# Patient Record
Sex: Female | Born: 1960
Health system: Southern US, Community
[De-identification: ages and names within clinical notes are randomized; demographics above are authoritative.]

## PROBLEM LIST (undated history)

## (undated) DIAGNOSIS — Z9221 Personal history of antineoplastic chemotherapy: Secondary | ICD-10-CM

## (undated) DIAGNOSIS — M1990 Unspecified osteoarthritis, unspecified site: Secondary | ICD-10-CM

## (undated) DIAGNOSIS — E669 Obesity, unspecified: Secondary | ICD-10-CM

## (undated) DIAGNOSIS — Z8719 Personal history of other diseases of the digestive system: Secondary | ICD-10-CM

## (undated) DIAGNOSIS — Z789 Other specified health status: Secondary | ICD-10-CM

## (undated) DIAGNOSIS — K219 Gastro-esophageal reflux disease without esophagitis: Secondary | ICD-10-CM

## (undated) DIAGNOSIS — F32A Depression, unspecified: Secondary | ICD-10-CM

## (undated) DIAGNOSIS — Z923 Personal history of irradiation: Secondary | ICD-10-CM

## (undated) DIAGNOSIS — F329 Major depressive disorder, single episode, unspecified: Secondary | ICD-10-CM

## (undated) DIAGNOSIS — M25561 Pain in right knee: Secondary | ICD-10-CM

## (undated) DIAGNOSIS — C50919 Malignant neoplasm of unspecified site of unspecified female breast: Secondary | ICD-10-CM

## (undated) DIAGNOSIS — IMO0002 Reserved for concepts with insufficient information to code with codable children: Secondary | ICD-10-CM

## (undated) DIAGNOSIS — C801 Malignant (primary) neoplasm, unspecified: Secondary | ICD-10-CM

## (undated) DIAGNOSIS — F419 Anxiety disorder, unspecified: Secondary | ICD-10-CM

## (undated) DIAGNOSIS — Z79810 Long term (current) use of selective estrogen receptor modulators (SERMs): Secondary | ICD-10-CM

## (undated) DIAGNOSIS — E785 Hyperlipidemia, unspecified: Secondary | ICD-10-CM

## (undated) HISTORY — DX: Malignant neoplasm of unspecified site of unspecified female breast: C50.919

## (undated) HISTORY — DX: Malignant (primary) neoplasm, unspecified: C80.1

## (undated) HISTORY — DX: Major depressive disorder, single episode, unspecified: F32.9

## (undated) HISTORY — DX: Depression, unspecified: F32.A

## (undated) HISTORY — DX: Unspecified osteoarthritis, unspecified site: M19.90

## (undated) HISTORY — DX: Obesity, unspecified: E66.9

## (undated) HISTORY — DX: Reserved for concepts with insufficient information to code with codable children: IMO0002

## (undated) HISTORY — DX: Pain in right knee: M25.561

## (undated) HISTORY — PX: COLONOSCOPY: SHX174

## (undated) HISTORY — DX: Anxiety disorder, unspecified: F41.9

---

## 1999-01-02 ENCOUNTER — Encounter: Admission: RE | Admit: 1999-01-02 | Discharge: 1999-01-02 | Payer: Self-pay | Admitting: *Deleted

## 1999-03-11 ENCOUNTER — Other Ambulatory Visit: Admission: RE | Admit: 1999-03-11 | Discharge: 1999-03-11 | Payer: Self-pay | Admitting: Obstetrics and Gynecology

## 2000-03-23 ENCOUNTER — Other Ambulatory Visit: Admission: RE | Admit: 2000-03-23 | Discharge: 2000-03-23 | Payer: Self-pay | Admitting: Obstetrics and Gynecology

## 2000-04-29 HISTORY — PX: ABDOMINAL HYSTERECTOMY: SHX81

## 2000-06-07 ENCOUNTER — Encounter (INDEPENDENT_AMBULATORY_CARE_PROVIDER_SITE_OTHER): Payer: Self-pay

## 2000-06-07 ENCOUNTER — Inpatient Hospital Stay (HOSPITAL_COMMUNITY): Admission: RE | Admit: 2000-06-07 | Discharge: 2000-06-10 | Payer: Self-pay | Admitting: Obstetrics and Gynecology

## 2001-04-20 ENCOUNTER — Encounter: Payer: Self-pay | Admitting: Obstetrics and Gynecology

## 2001-04-20 ENCOUNTER — Ambulatory Visit (HOSPITAL_COMMUNITY): Admission: RE | Admit: 2001-04-20 | Discharge: 2001-04-20 | Payer: Self-pay | Admitting: Obstetrics and Gynecology

## 2003-07-23 ENCOUNTER — Other Ambulatory Visit: Admission: RE | Admit: 2003-07-23 | Discharge: 2003-07-23 | Payer: Self-pay | Admitting: Obstetrics and Gynecology

## 2004-04-20 ENCOUNTER — Emergency Department (HOSPITAL_COMMUNITY): Admission: EM | Admit: 2004-04-20 | Discharge: 2004-04-20 | Payer: Self-pay | Admitting: Emergency Medicine

## 2004-05-10 ENCOUNTER — Emergency Department (HOSPITAL_COMMUNITY): Admission: EM | Admit: 2004-05-10 | Discharge: 2004-05-10 | Payer: Self-pay | Admitting: *Deleted

## 2005-03-22 ENCOUNTER — Ambulatory Visit (HOSPITAL_COMMUNITY): Admission: RE | Admit: 2005-03-22 | Discharge: 2005-03-22 | Payer: Self-pay | Admitting: *Deleted

## 2008-11-29 DIAGNOSIS — M1990 Unspecified osteoarthritis, unspecified site: Secondary | ICD-10-CM

## 2008-11-29 DIAGNOSIS — IMO0002 Reserved for concepts with insufficient information to code with codable children: Secondary | ICD-10-CM

## 2008-11-29 HISTORY — DX: Unspecified osteoarthritis, unspecified site: M19.90

## 2008-11-29 HISTORY — DX: Reserved for concepts with insufficient information to code with codable children: IMO0002

## 2009-05-09 ENCOUNTER — Emergency Department (HOSPITAL_COMMUNITY): Admission: EM | Admit: 2009-05-09 | Discharge: 2009-05-09 | Payer: Self-pay | Admitting: Emergency Medicine

## 2010-05-05 ENCOUNTER — Encounter: Admission: RE | Admit: 2010-05-05 | Discharge: 2010-05-05 | Payer: Self-pay | Admitting: Sports Medicine

## 2010-05-07 ENCOUNTER — Encounter: Admission: RE | Admit: 2010-05-07 | Discharge: 2010-05-07 | Payer: Self-pay | Admitting: Sports Medicine

## 2010-05-11 ENCOUNTER — Ambulatory Visit (HOSPITAL_COMMUNITY): Admission: RE | Admit: 2010-05-11 | Discharge: 2010-05-11 | Payer: Self-pay | Admitting: Obstetrics and Gynecology

## 2010-05-13 ENCOUNTER — Encounter: Admission: RE | Admit: 2010-05-13 | Discharge: 2010-05-13 | Payer: Self-pay | Admitting: Sports Medicine

## 2010-05-20 ENCOUNTER — Encounter: Admission: RE | Admit: 2010-05-20 | Discharge: 2010-05-20 | Payer: Self-pay | Admitting: Obstetrics and Gynecology

## 2010-05-29 ENCOUNTER — Encounter: Admission: RE | Admit: 2010-05-29 | Discharge: 2010-05-29 | Payer: Self-pay | Admitting: Sports Medicine

## 2010-06-24 ENCOUNTER — Encounter: Admission: RE | Admit: 2010-06-24 | Discharge: 2010-06-24 | Payer: Self-pay | Admitting: Sports Medicine

## 2010-06-26 ENCOUNTER — Emergency Department (HOSPITAL_COMMUNITY): Admission: EM | Admit: 2010-06-26 | Discharge: 2010-06-26 | Payer: Self-pay | Admitting: Emergency Medicine

## 2010-12-20 ENCOUNTER — Encounter: Payer: Self-pay | Admitting: Sports Medicine

## 2011-01-07 ENCOUNTER — Other Ambulatory Visit: Payer: Self-pay | Admitting: Sports Medicine

## 2011-01-07 DIAGNOSIS — M25552 Pain in left hip: Secondary | ICD-10-CM

## 2011-01-10 ENCOUNTER — Other Ambulatory Visit: Payer: Self-pay

## 2011-01-11 ENCOUNTER — Encounter (INDEPENDENT_AMBULATORY_CARE_PROVIDER_SITE_OTHER): Payer: Self-pay | Admitting: *Deleted

## 2011-01-12 ENCOUNTER — Ambulatory Visit
Admission: RE | Admit: 2011-01-12 | Discharge: 2011-01-12 | Disposition: A | Discharge status: Home / Self Care | Payer: Private Health Insurance - Indemnity | Source: Ambulatory Visit | Attending: Sports Medicine | Admitting: Sports Medicine

## 2011-01-12 DIAGNOSIS — M25552 Pain in left hip: Secondary | ICD-10-CM

## 2011-01-20 NOTE — Letter (Signed)
Summary: Pre Visit Letter Revised  [facility]  [address] [place], [place] [address]   Phone: [phone]  Fax: [phone]        01/11/2011 MRN: [id number] [facility] [address] [place], [place]  [address]             Procedure Date:  February 04, 2011   dir col -Dr Juanda Chance   Welcome to the Gastroenterology Division at [facility].    You are scheduled to see a nurse for your pre-procedure visit on January 21, 2011 at 9:00am on the 3rd floor at [facility], [address]  We ask that you try to arrive at our office 15 minutes prior to your appointment time to allow for check-in.  Please take a minute to review the attached form.  If you answer "Yes" to one or more of the questions on the first page, we ask that you call the person listed at your earliest opportunity.  If you answer "No" to all of the questions, please complete the rest of the form and bring it to your appointment.    Your nurse visit will consist of discussing your medical and surgical history, your immediate family medical history, and your medications.   If you are unable to list all of your medications on the form, please bring the medication bottles to your appointment and we will list them.  We will need to be aware of both prescribed and over the counter drugs.  We will need to know exact dosage information as well.    Please be prepared to read and sign documents such as consent forms, a financial agreement, and acknowledgement forms.  If necessary, and with your consent, a friend or relative is welcome to sit-in on the nurse visit with you.  Please bring your insurance card so that we may make a copy of it.  If your insurance requires a referral to see a specialist, please bring your referral form from your primary care physician.  No co-pay is required for this nurse visit.     If you cannot keep your appointment, please call [phone] to cancel or reschedule prior  to your appointment date.  This allows [place] the opportunity to schedule an appointment for another patient in need of care.    Thank you for choosing  Gastroenterology for your medical needs.  We appreciate the opportunity to care for you.  Please visit [place] at our website  to learn more about our practice.  Sincerely, The Gastroenterology Division

## 2011-01-21 ENCOUNTER — Other Ambulatory Visit: Payer: Self-pay | Admitting: Sports Medicine

## 2011-01-21 DIAGNOSIS — M47817 Spondylosis without myelopathy or radiculopathy, lumbosacral region: Secondary | ICD-10-CM

## 2011-01-26 ENCOUNTER — Ambulatory Visit
Admission: RE | Admit: 2011-01-26 | Discharge: 2011-01-26 | Disposition: A | Discharge status: Home / Self Care | Payer: Private Health Insurance - Indemnity | Source: Ambulatory Visit | Attending: Sports Medicine | Admitting: Sports Medicine

## 2011-01-26 DIAGNOSIS — M47817 Spondylosis without myelopathy or radiculopathy, lumbosacral region: Secondary | ICD-10-CM

## 2011-02-04 ENCOUNTER — Other Ambulatory Visit: Payer: Self-pay | Admitting: Internal Medicine

## 2011-02-15 ENCOUNTER — Other Ambulatory Visit: Payer: Self-pay | Admitting: Sports Medicine

## 2011-02-15 DIAGNOSIS — M47817 Spondylosis without myelopathy or radiculopathy, lumbosacral region: Secondary | ICD-10-CM

## 2011-02-18 ENCOUNTER — Ambulatory Visit
Admission: RE | Admit: 2011-02-18 | Discharge: 2011-02-18 | Disposition: A | Discharge status: Home / Self Care | Payer: Private Health Insurance - Indemnity | Source: Ambulatory Visit | Attending: Sports Medicine | Admitting: Sports Medicine

## 2011-02-18 DIAGNOSIS — M47817 Spondylosis without myelopathy or radiculopathy, lumbosacral region: Secondary | ICD-10-CM

## 2011-03-08 LAB — URINALYSIS, ROUTINE W REFLEX MICROSCOPIC
Ketones, ur: NEGATIVE mg/dL
Nitrite: NEGATIVE
Protein, ur: NEGATIVE mg/dL
Specific Gravity, Urine: 1.004 — ABNORMAL LOW (ref 1.005–1.030)

## 2011-03-08 LAB — DIFFERENTIAL
Basophils Absolute: 0 10*3/uL (ref 0.0–0.1)
Basophils Relative: 1 % (ref 0–1)
Eosinophils Absolute: 0.1 10*3/uL (ref 0.0–0.7)
Eosinophils Relative: 1 % (ref 0–5)
Lymphocytes Relative: 32 % (ref 12–46)
Lymphs Abs: 2.4 10*3/uL (ref 0.7–4.0)
Monocytes Relative: 8 % (ref 3–12)
Neutrophils Relative %: 59 % (ref 43–77)

## 2011-03-08 LAB — POCT I-STAT, CHEM 8
BUN: 7 mg/dL (ref 6–23)
Calcium, Ion: 1.11 mmol/L — ABNORMAL LOW (ref 1.12–1.32)
Chloride: 104 mEq/L (ref 96–112)
Glucose, Bld: 104 mg/dL — ABNORMAL HIGH (ref 70–99)
HCT: 36 % (ref 36.0–46.0)
Potassium: 4 mEq/L (ref 3.5–5.1)

## 2011-03-08 LAB — POCT PREGNANCY, URINE: Preg Test, Ur: NEGATIVE

## 2011-03-08 LAB — CBC
RDW: 12.4 % (ref 11.5–15.5)
WBC: 7.4 10*3/uL (ref 4.0–10.5)

## 2011-04-13 ENCOUNTER — Other Ambulatory Visit: Payer: Self-pay | Admitting: Sports Medicine

## 2011-04-13 DIAGNOSIS — M47817 Spondylosis without myelopathy or radiculopathy, lumbosacral region: Secondary | ICD-10-CM

## 2011-04-16 ENCOUNTER — Ambulatory Visit
Admission: RE | Admit: 2011-04-16 | Discharge: 2011-04-16 | Disposition: A | Discharge status: Home / Self Care | Payer: Private Health Insurance - Indemnity | Source: Ambulatory Visit | Attending: Sports Medicine | Admitting: Sports Medicine

## 2011-04-16 DIAGNOSIS — M47817 Spondylosis without myelopathy or radiculopathy, lumbosacral region: Secondary | ICD-10-CM

## 2011-04-16 NOTE — Discharge Summary (Signed)
[facility]  Patient:    Joann Page, Joann Page                        MRN: [id number] Adm. Date:  [id number] Disc. Date: [id number] Attending:  Miguel Aschoff                           Discharge Summary  ADMITTING DIAGNOSES:          Symptomatic uterine fibroids, umbilical hernia.  OPERATIONS AND PROCEDURES:    1. Total abdominal hysterectomy.                               2. Umbilical herniorrhaphy.  BRIEF HISTORY:                The patient is a 50 year old black female, para 1-0-0-1, with a history of chronic pelvic pain and irregular vaginal bleeding. The patient underwent diagnostic laparoscopy at which time she was noted to have multiple subserosal and intramural leiomyomas.  In addition, she had history of irregular vaginal bleeding which was treated with Provera.  The patient was started on Depot Lupron therapy and received three injections of this medication.  It did cause some improvement of her pain but not satisfactory in view of the patient.  It did, however, correct the irregular bleeding.  Because of her persistent pelvic pain, she wanted definitive procedure to be carried out and was offered total abdominal hysterectomy.  In addition, she reported that she had a bulge near the umbilicus with symptomatic umbilical hernia and asked that this be repaired at the time of her surgical procedure.  HOSPITAL COURSE:              Preoperative studies were obtained.  The patient had an admission hemoglobin of 14.6, white count 4500.  Platelet count was normal.  PT and PTT were within normal limits.  Comprehensive metabolic panel was within normal limits.  Urinalysis was negative except for trace of leukocyte esterase.  On June 07, 2000, under general anesthesia, a total abdominal hysterectomy was carried out without difficulty, as well as a repair of her umbilical hernia.  The patient tolerated this procedure well.  Her hemoglobin remained stable.  She did  tolerate increasing ambulation and diet well and was well enough to be discharged home on June 10, 2000.  DISCHARGE MEDICATIONS:        Percocet 5 mg 1 every 3 hours as needed for pain.  FOLLOW-UP:                    We set up a follow-up visit for four weeks.  DISCHARGE INSTRUCTIONS:       She was instructed no heavy lifting, to call if there were any problems such as severe pain or heavy bleeding.  She was instructed to place nothing in the vagina and not to drive for approximately ten days.  CONDITION ON DISCHARGE:       Satisfactory.  Her diet was regular.  Final pathology report revealed focal adenomyosis, uterine serosal fibrous adhesions, two benign leiomyomata, proliferative endometrium, and chronic cervicitis. DD:  08/06/00 TD:  08/07/00 Job: [id number]

## 2011-04-16 NOTE — Op Note (Signed)
[facility]  Patient:    Joann Page, Joann Page                        MRN: [id number] Proc. Date: 06/07/00 Attending:  Miguel Aschoff, M.D.                           Operative Report  PREOPERATIVE DIAGNOSES:       1. Uterine fibroids, menorrhagia, pelvic pain.                               2. Umbilical hernia.  POSTOPERATIVE DIAGNOSES:  OPERATIONS AND PROCEDURES:    Total abdominal hysterectomy.  Umbilical hernia repair.  SURGEON:                      Miguel Aschoff, M.D.  ASSISTANT:                    Luvenia Redden, M.D.  ANESTHESIA:                   General.  COMPLICATIONS:                None.  JUSTIFICATION:                The patient is a 50 year old black female with a history of uterine fibroids associated with pelvic pain and heavy bleeding. The patient has undergone Lupron therapy in an effort to decrease the size and symptoms associated with the fibroids.  The last course of treatment was completed in January.  She presents now to undergo definitive therapy via total abdominal hysterectomy.  In addition, she is noted to have symptomatic umbilical hernia in the superior portion of the umbilicus and is to have this repaired at this time.  DESCRIPTION OF PROCEDURE:     Patient was taken to the operating room, placed in a supine position and general anesthesia was administered without difficulty.  She was then prepped and draped in the usual sterile fashion. Foley catheter was inserted.  A Pfannenstiel incision was then made and extended down through the subcutaneous tissue, with bleeding points being clamped and coagulated as they were encountered.  The fascia was then identified and incised transversely.  The fascia was then separated from the underlying rectus muscles.  Rectus muscles were divided in the midline.  The peritoneum was identified and entered, carefully avoiding underlying structures.  The peritoneal incision was extended under  direct visualization. After this was done, a self-retaining retractor was placed through the wound and the viscera were packed out of the pelvis.  The uterus showed small residual uterine fibroids following the Lupron therapy.  Tubes and ovaries appeared to be within normal limits except for prior evidence of a tubal sterilization.  Cul-de-sac was unremarkable.  Intestinal surfaces were unremarkable.  At this point, the uterus was elevated by placing Kelly clamps across the uteroovarian ligaments.  The round ligaments were then identified, clamped, cut and suture-ligated using suture ligatures of 0 Vicryl.  Bladder flap was then created anteriorly and the broad ligament was skeletonized. Perforations were made below the uteroovarian ligaments.  These ligaments were then clamped with Heaney clamps and then these pedicles were cut and doubly ligated using ligatures of 0 Vicryl.  Uterine vessels were then identified, clamped with curved Heaney clamps.  These pedicles were  cut and suture-ligated using suture ligatures of 0 Vicryl.  Then in serial fashion, after a bladder flap had been created, the paracervical fascia, cardinal ligaments and uterosacral ligaments were clamped with straight Heaney clamps and all pedicles were cut and suture-ligated using suture ligatures of 0 Vicryl and curved Heaney clamps were placed across the reflection of the vagina on the cervix.  This then allowed the removal of the specimen which consisted of the cervix and uterus.  The angles of the vaginal cuff were then ligated using figure-of-eight sutures of 0 Vicryl.  The uterosacral ligaments were incorporated into these angle sutures for support.  These were then held. Then the cuff was closed using interrupted 0 Vicryl suture ligatures. Inspection was made for hemostasis, hemostasis appeared to be excellent and then the pelvic floor was reperitonealized using running continuous 2-0 Vicryl suture.  All pedicles  were then inspected.  There was excellent hemostasis noted.  At this point, I elected to close the abdomen.  Prior to closing the abdomen, the defect in the peritoneum at the level of the umbilicus was found, as were the defects in the fascia.  The fascia in this area was grasped with Kocher clamps and using two figure-of-eight sutures of 0 Vicryl, it was possible to close the fascial defect at the level of the umbilicus.  Once this was done, the abdomen was closed.  The parietal peritoneum was closed using running continuous 0 Vicryl suture.  The rectus muscles were reapproximated using running continuous 0 Vicryl suture.  Fascia was then closed using two sutures of 0 Vicryl, each starting at the lateral fascial angles and meeting in the midline.  Subcutaneous tissue was closed using interrupted 0 Vicryl sutures and the skin incision was closed using staples.  The estimated blood loss was approximately 200 cc.  The patient tolerated the procedure well and went to the recovery room in satisfactory condition. DD:  06/07/00 TD:  06/07/00 Job: [id number]

## 2011-04-16 NOTE — H&P (Signed)
[facility]  Patient:    Joann Page, Joann Page                        MRN: [id number] Adm. Date:  06/07/00 Attending:  Miguel Aschoff, M.D.                         History and Physical  ADMISSION DIAGNOSIS:          Symptomatic uterine fibroids, umbilical hernia.  HISTORY:                      The patient is a 50 year old black female para 1-0-0-1 who has a history of pelvic pain and bleeding.  The patient was noted to have large uterine fibroids and ultimately underwent laparoscopy in 1998, at which time she was noted to have multiple subserosal and submucosal leiomyomas.  In addition, her irregular bleeding at that time was treated with cyclic Provera therapy.  The patient initially did well; however, developed pelvic pain again and was started on Depo-Lupron in an effort to try to reduce the size of the uterine fibroids.  Patient was started on this medication in November 2000 and received three injections of the Depo-Lupron.  This did cause some improvement of her symptoms; however, did not really improve the patients pain.  In view of this symptomatology, she desired a definitive procedure to be carried out due to the irregular bleeding and pain associated with the fibroids.  She was therefore scheduled at this time to undergo abdominal hysterectomy.                                In addition, the patient has a history of an umbilical hernia, mildly symptomatic, and has requested that this be repaired if possible at the time of hysterectomy.  Patients last Pap smear on March 23, 2000 was satisfactory and within normal limits.  The patient denies any history of dysuria, frequency, or urgency.  She has no history of dysuria. There is no history of nausea, vomiting, diarrhea, melena, or hematochezia.  PAST MEDICAL HISTORY:         Negative for blood pressure, heart disease, or asthma.  PAST SURGICAL HISTORY:        Positive for laparoscopy, tubal  sterilization, and myomectomy.  MEDICATIONS AT THE CURRENT TIME:                 Depo-Testosterone.  ALLERGIC HISTORY:             Positive for ASPIRIN, which causes swelling.  REVIEW OF SYSTEMS:            Review of HEENT is negative.  CARDIORESPIRATORY review is negative for shortness of breath, cough, or hemoptysis.  GI review is as per the present illness.  GU review - see present illness.  GYN review - see present illness.  PHYSICAL EXAMINATION:  GENERAL:                      The patient is a well-developed, well-nourished black female in no acute distress.  VITAL SIGNS:                  Temperature 97.7, pulse 70, respirations 20, blood pressure 118/73.  HEENT:  Head normocephalic, atraumatic.  Eyes show pupils to be equal, round, and reactive to light and accommodation.  Sclerae are white and conjunctivae are pink.  Extraocular muscles show full range of motion.  Throat is pink without gross lesions.  Tongue is moist and midline.  NECK:                         Supple.  There is no adenopathy or jugular venous distension noted.  No thyromegaly is noted.  HEART:                        Regular rate and rhythm with no murmur or gallop.  BREASTS:                      Nontender.  No masses are found.  ABDOMEN:                      Soft without any evidence of enlargement of the liver, kidneys, or spleen.  There is an approximately 2 x 2 cm umbilical hernia present in the superior left portion of the umbilicus.  Remainder of the abdominal exam is negative.  BACK:                         Examination reveals no CVA tenderness or deformity.  PELVIC:                       Normal external genitalia, normal Bartholin and Skenes glands, normal urethra.  Vaginal vault is without gross lesions. Cervix is without gross lesions.  Uterus is approximately six weeks size and slightly irregular in shape, moderately tender.  The adnexa reveal no definite masses.   Rectovaginal exam is confirmatory.  EXTREMITIES:                  No cyanosis, clubbing, or edema.  SKIN:                         Without gross lesion.  NEUROLOGIC:                   Grossly intact.  IMPRESSION:                   1. Symptomatic uterine fibroids with history of                                  irregular bleeding and pelvic pain.                               2. Umbilical hernia.  PLAN:                         The patient to undergo total abdominal hysterectomy and repair of her umbilical hernia. DD:  06/07/00 TD:  06/07/00 Job: [id number]

## 2011-04-16 NOTE — Op Note (Signed)
NAMEDORIANNE, PERRET                 ACCOUNT NO.:  [id number]   MEDICAL RECORD NO.:  [id number]          PATIENT TYPE:  AMB   LOCATION:  ENDO                         FACILITY:  [facility]   PHYSICIAN:  Georgiana Spinner, M.D.    DATE OF BIRTH:  01/04/1965   DATE OF PROCEDURE:  03/22/2005  DATE OF DISCHARGE:                                 OPERATIVE REPORT   PROCEDURE:  Colonoscopy.   INDICATIONS FOR PROCEDURE:  Rectal bleeding, family history of colon cancer.   ANESTHESIA:  Demerol 90, Versed 10 mg.   DESCRIPTION OF PROCEDURE:  With the patient mildly sedated in the left  lateral decubitus position, the Olympus videoscopic colonoscope was inserted  in the rectum and passed under direct vision to the cecum identified by the  base of cecum and ileocecal valve both of which were photographed. From this  point, the colonoscope was slowly withdrawn taking circumferential views of  the colonic mucosa stopping only in the rectum which appeared normal on  direct and showed hemorrhoids on retroflexed view. The endoscope was  straightened and withdrawn. The patient's vital signs and pulse oximeter  remained stable. The patient tolerated the procedure well without apparent  complications.   FINDINGS:  Internal hemorrhoids otherwise unremarkable colonoscopic  examination.   PLAN:  Repeat examination probably in about five years.      GMO/MEDQ  D:  03/22/2005  T:  03/22/2005  Job:  [id number]

## 2011-04-29 ENCOUNTER — Ambulatory Visit (AMBULATORY_SURGERY_CENTER): Payer: Private Health Insurance - Indemnity | Admitting: *Deleted

## 2011-04-29 ENCOUNTER — Telehealth: Payer: Self-pay | Admitting: *Deleted

## 2011-04-29 ENCOUNTER — Encounter: Payer: Self-pay | Admitting: Internal Medicine

## 2011-04-29 VITALS — Ht 66.0 in | Wt 205.2 lb

## 2011-04-29 DIAGNOSIS — Z1211 Encounter for screening for malignant neoplasm of colon: Secondary | ICD-10-CM

## 2011-04-29 DIAGNOSIS — Z800 Family history of malignant neoplasm of digestive organs: Secondary | ICD-10-CM

## 2011-04-29 MED ORDER — PEG-KCL-NACL-NASULF-NA ASC-C 100 G PO SOLR: ORAL | Status: DC

## 2011-04-29 NOTE — Telephone Encounter (Signed)
Dr Juanda Chance- pt is scheduled for colonoscopy w/ you 6/11. You have not seen her before.  She had colonoscopy w/ Dr. Virginia Rochester 2006. pt had hemorrhoids, otherwise normal colon. Pt is not having any problems.  Father had colon CA in late 39's. Is pt due for colon now? Ezra Sites

## 2011-05-02 ENCOUNTER — Emergency Department (HOSPITAL_COMMUNITY): Payer: Private Health Insurance - Indemnity

## 2011-05-02 ENCOUNTER — Emergency Department (HOSPITAL_COMMUNITY)
Admission: EM | Admit: 2011-05-02 | Discharge: 2011-05-03 | Disposition: A | Discharge status: Home / Self Care | Payer: Private Health Insurance - Indemnity | Attending: Emergency Medicine | Admitting: Emergency Medicine

## 2011-05-02 DIAGNOSIS — R109 Unspecified abdominal pain: Secondary | ICD-10-CM | POA: Insufficient documentation

## 2011-05-02 DIAGNOSIS — R110 Nausea: Secondary | ICD-10-CM | POA: Insufficient documentation

## 2011-05-02 LAB — URINALYSIS, ROUTINE W REFLEX MICROSCOPIC
Nitrite: NEGATIVE
Specific Gravity, Urine: 1.021 (ref 1.005–1.030)
Urobilinogen, UA: 1 mg/dL (ref 0.0–1.0)
pH: 5.5 (ref 5.0–8.0)

## 2011-05-02 LAB — DIFFERENTIAL
Lymphocytes Relative: 26 % (ref 12–46)
Lymphs Abs: 2.1 10*3/uL (ref 0.7–4.0)
Neutrophils Relative %: 66 % (ref 43–77)

## 2011-05-02 LAB — CBC
HCT: 37.9 % (ref 36.0–46.0)
MCH: 29.1 pg (ref 26.0–34.0)
MCV: 88.1 fL (ref 78.0–100.0)
WBC: 8.3 10*3/uL (ref 4.0–10.5)

## 2011-05-02 LAB — COMPREHENSIVE METABOLIC PANEL
ALT: 38 U/L — ABNORMAL HIGH (ref 0–35)
CO2: 24 mEq/L (ref 19–32)
Calcium: 8.9 mg/dL (ref 8.4–10.5)
Creatinine, Ser: 0.72 mg/dL (ref 0.4–1.2)
GFR calc Af Amer: >60 mL/min (ref >60)
GFR calc non Af Amer: >60 mL/min (ref >60)
Glucose, Bld: 85 mg/dL (ref 70–99)
Sodium: 137 mEq/L (ref 135–145)
Total Protein: 7.2 g/dL (ref 6.0–8.3)

## 2011-05-02 LAB — LIPASE, BLOOD: Lipase: 23 U/L (ref 11–59)

## 2011-05-03 ENCOUNTER — Telehealth: Payer: Self-pay | Admitting: Internal Medicine

## 2011-05-03 ENCOUNTER — Telehealth: Payer: Self-pay | Admitting: *Deleted

## 2011-05-03 NOTE — Telephone Encounter (Signed)
Pt aware of need for colon now. Joann Page

## 2011-05-03 NOTE — Telephone Encounter (Signed)
Yes, she definitely needs a colonoscopy every 5 years if had dad had colon cancer in his 26'ies.

## 2011-05-03 NOTE — Telephone Encounter (Signed)
Pt. Seen in ER at [facility] last night with abd. Discomfort, bloating, etc.  States they put her on a medication for her stomach that's helping a lot.  She wants to see the doctor for her symptoms.  Advised her to call schedulers and see if she could be worked in.  Also advised her that she could stay on the ?PPI they gave her for her stomach at the ER.  She was CT scanned while in the ER

## 2011-05-03 NOTE — Telephone Encounter (Signed)
Patient was in ER last night for abdominal pain. She has never been seen here but is scheduled for direct colonoscopy with Dr. Juanda Chance on 05/10/11. She states her stomach is bothering her and she thinks she needs to be seen prior to procedure. Scheduled with Willette Cluster, NP on 05/05/11 at 8:30 AM

## 2011-05-05 ENCOUNTER — Ambulatory Visit: Payer: Private Health Insurance - Indemnity | Admitting: Nurse Practitioner

## 2011-05-07 ENCOUNTER — Telehealth: Payer: Self-pay | Admitting: Internal Medicine

## 2011-05-07 ENCOUNTER — Telehealth: Payer: Self-pay | Admitting: *Deleted

## 2011-05-07 NOTE — Telephone Encounter (Signed)
See phone note from 05-03-11.  Pt states she still has feeling of "fullness, nausea, no appetite." Pt is on Pantoprazole and states the pain she was having is better.  Pt wanted to make sure Dr. Juanda Chance wanted to proceed with colonoscopy scheduled on Monday or if she wanted her to have an office visit first.

## 2011-05-07 NOTE — Telephone Encounter (Signed)
See earlier phone note from 05-07-11.  Dr. Juanda Chance is out of the office, so Dr. Christella Hartigan spoken with about pt.  Per Dr. Christella Hartigan, pt should see Dr. Juanda Chance is office before colonoscopy should be done.  Pt advised and understanding voiced.  Pt transferred to [facility] on third floor to cancel colonoscopy and set up office visit.

## 2011-05-07 NOTE — Telephone Encounter (Signed)
I have spoken to the pt, she is still nauseated, may not be able to drink the prep. The colon has been canceled. I will see her in the office next week.

## 2011-05-10 ENCOUNTER — Other Ambulatory Visit: Payer: Private Health Insurance - Indemnity | Admitting: Internal Medicine

## 2011-05-13 ENCOUNTER — Encounter: Payer: Self-pay | Admitting: Internal Medicine

## 2011-05-13 ENCOUNTER — Encounter: Payer: Self-pay | Admitting: Physician Assistant

## 2011-05-13 ENCOUNTER — Ambulatory Visit (INDEPENDENT_AMBULATORY_CARE_PROVIDER_SITE_OTHER): Payer: Private Health Insurance - Indemnity | Admitting: Physician Assistant

## 2011-05-13 VITALS — BP 108/80 | HR 68 | Ht 66.0 in | Wt 205.4 lb

## 2011-05-13 DIAGNOSIS — R1013 Epigastric pain: Secondary | ICD-10-CM

## 2011-05-13 DIAGNOSIS — R110 Nausea: Secondary | ICD-10-CM

## 2011-05-13 DIAGNOSIS — Z800 Family history of malignant neoplasm of digestive organs: Secondary | ICD-10-CM

## 2011-05-13 DIAGNOSIS — R6881 Early satiety: Secondary | ICD-10-CM

## 2011-05-13 MED ORDER — PANTOPRAZOLE SODIUM 40 MG PO TBEC: 40.0000 mg | DELAYED_RELEASE_TABLET | Freq: Every day | ORAL | Status: DC

## 2011-05-13 MED ORDER — PEG-KCL-NACL-NASULF-NA ASC-C 100 G PO SOLR: 1.0000 | Freq: Once | ORAL | Status: AC

## 2011-05-13 MED ORDER — PEG-KCL-NACL-NASULF-NA ASC-C 100 G PO SOLR: 1.0000 | Freq: Once | ORAL | Status: DC

## 2011-05-13 MED ORDER — ONDANSETRON HCL 4 MG PO TABS: 4.0000 mg | ORAL_TABLET | Freq: Every day | ORAL | Status: AC | PRN

## 2011-05-13 MED ORDER — ONDANSETRON HCL 4 MG PO TABS: 4.0000 mg | ORAL_TABLET | Freq: Every day | ORAL | Status: DC | PRN

## 2011-05-13 NOTE — Patient Instructions (Signed)
We have given you samples of the Align probiotic. Take 1 capsule for 14 days. Stay off the Naprosyn. We sent prescriptions to [facility] [address] for Moviprep for the colonoscopy, Pantoprazole Sodium, and Zofran 4mg .  We scheduled the colonoscopy /Endoscopy for 7-24. Call us 1 week ahead of the 7-24 and if you are feeling much better, we can cancel the Endoscopy and we will only do the colonoscopy.

## 2011-05-13 NOTE — Progress Notes (Signed)
Subjective:    Patient ID: Joann Page, female    DOB: Aug 31, 1961, 50 y.o.   MRN: [id number]  HPI Joann Page is a pleasant 50 year old female new to Dr. Juanda Chance who was scheduled to undergo colonoscopy for screening this past week. She has positive family history of colon cancer in her father diagnosed at age 93 and deceased at age 57. Her brother  also has had a large polyp present which required surgical resection. She had a previous colonoscopy done at age 89 per Dr. Virginia Rochester and states that this was normal without any polyps found. Approximately 2 weeks ago she had fairly abrupt onset of nausea and upper abdominal discomfort associated with abdominal cramping and soft more frequent stools. Over the first few days she did see a small amount of bright red blood with her stools and that has since resolved. She did not have any associated fever chills headache et Karie Soda. She was seen in the ER due to persistent symptoms on 65 had upper abdominal ultrasound done which was negative and laboratory studies which were also unremarkable. She was started on Protonix 40 mg daily. She says most of her symptoms improved and actually yesterday she had a good day. She still is still struggling with lack of appetite and some mild fullness. She had nausea off and on for the past 2 weeks without vomiting most of that has resolved. She had been taking diclofenac regularly over the past 2 months due to neck pain and she has stopped that as well.  She comes in today for advice and also to our reschedule her colonoscopy.   Review of Systems  Constitutional: Positive for appetite change.  HENT: Positive for neck pain.   Eyes: Negative.   Respiratory: Negative.   Cardiovascular: Negative.   Gastrointestinal: Positive for nausea and abdominal pain.  Genitourinary: Negative.   Skin: Negative.   Neurological: Negative.   Hematological: Negative.   Psychiatric/Behavioral: Negative.    Outpatient Encounter Prescriptions as of  05/13/2011  Medication Sig Dispense Refill  . Cholecalciferol (VITAMIN D) 2000 UNITS CAPS Take 1 capsule by mouth daily.        Marland Kitchen DICLOFENAC SODIUM PO Take 1 capsule by mouth 2 (two) times daily.        Marland Kitchen glucosamine-chondroitin 500-400 MG tablet Take 1 tablet by mouth 3 (three) times daily.        . peg 3350 powder (MOVIPREP) 100 G SOLR moviprep as directed  1 kit  0   Allergies  Allergen Reactions  . Sulfa Antibiotics Shortness Of Breath and Swelling       Objective:   Physical Exam Well-developed female in no acute distress, pleasant, alert and oriented x3 HEENT; nontraumatic normocephalic EOMI PERRLA , Neck supple; no JVD  Cardiovascular ;regular rate and rhythm with S1-S2 no murmur rub or gallop  Pulmonary; clear bilaterally  Abdomen; large soft bowel sounds hyperactive mild epigastric discomfort no palpable mass or hepatosplenomegaly no guarding or rebound  Rectal  not done  Extremities/skin warm and dry benign   Psych; mood and affect normal and appropriate.        Assessment & Plan:  #58 50 year old female with positive family history of colon cancer in her father deceased at age 36 due for followup colonoscopy.  Plan Reschedule colonoscopy with Dr. Juanda Chance.  #2 Acute illness with nausea early satiety nominal cramping and change in bowel habits- resolving. Patient with minimal residual nausea and decreased appetite. I suspect this was an acute viral gastroenteritis, cannot  rule out NSAID-induced gastropathy.  Plan Discussed with patient and we'll schedule her for upper endoscopy, however suspect that her symptoms will continue to improve and she is advised to call ahead and cancel the upper endoscopy if she is feeling well prior to that time. Continue Protonix 40 mg by mouth daily x2 more weeks Stay off Naprosyn Add trial Align one daily x2 weeks Zofran 4 mg every 6 hours as needed for nausea, she was advised she could take a dose of Zofran when starting her bowel prep.

## 2011-05-13 NOTE — Progress Notes (Signed)
Reviewed and agree with plans for recall colon. EGD if Sx's persist.

## 2011-06-07 ENCOUNTER — Other Ambulatory Visit: Payer: Self-pay | Admitting: Sports Medicine

## 2011-06-07 DIAGNOSIS — M542 Cervicalgia: Secondary | ICD-10-CM

## 2011-06-14 ENCOUNTER — Ambulatory Visit
Admission: RE | Admit: 2011-06-14 | Discharge: 2011-06-14 | Disposition: A | Discharge status: Home / Self Care | Payer: Private Health Insurance - Indemnity | Source: Ambulatory Visit | Attending: Sports Medicine | Admitting: Sports Medicine

## 2011-06-14 DIAGNOSIS — M542 Cervicalgia: Secondary | ICD-10-CM

## 2011-06-22 ENCOUNTER — Other Ambulatory Visit: Payer: Self-pay | Admitting: Internal Medicine

## 2011-06-22 ENCOUNTER — Encounter: Payer: Self-pay | Admitting: Internal Medicine

## 2011-06-22 ENCOUNTER — Encounter: Payer: Private Health Insurance - Indemnity | Admitting: Internal Medicine

## 2011-06-22 ENCOUNTER — Ambulatory Visit (AMBULATORY_SURGERY_CENTER): Payer: Private Health Insurance - Indemnity | Admitting: Internal Medicine

## 2011-06-22 DIAGNOSIS — R1031 Right lower quadrant pain: Secondary | ICD-10-CM

## 2011-06-22 DIAGNOSIS — Z800 Family history of malignant neoplasm of digestive organs: Secondary | ICD-10-CM

## 2011-06-22 DIAGNOSIS — R110 Nausea: Secondary | ICD-10-CM

## 2011-06-22 DIAGNOSIS — Z1211 Encounter for screening for malignant neoplasm of colon: Secondary | ICD-10-CM

## 2011-06-22 MED ORDER — SODIUM CHLORIDE 0.9 % IV SOLN: 500.0000 mL | INTRAVENOUS | Status: DC

## 2011-06-22 NOTE — Patient Instructions (Signed)
You need to have a Barium Enema due to the fact that Dr. Juanda Chance could not get all of the way through your colon.  The CMA's on the third floor will call you to arrange the test.  If you have any questions, please call [phone].  Thank-you.

## 2011-06-22 NOTE — Progress Notes (Signed)
Patient is scheduled for her barium enema on Thursday 26th of July at 10:30am.  She needs to arrive at 10:15am, and she needs to pick up the prep at [facility] tomorrow am.   Patient's sister has been called and has all of the information as the patient is currently with her.

## 2011-06-23 ENCOUNTER — Telehealth: Payer: Self-pay | Admitting: Internal Medicine

## 2011-06-23 ENCOUNTER — Telehealth: Payer: Self-pay

## 2011-06-23 NOTE — Telephone Encounter (Signed)

## 2011-06-23 NOTE — Telephone Encounter (Signed)
Pt stated she would rather wait until later, maybe Monday or Tuesday to do thr BE. OK with Dr Juanda Chance to wait until next week. Will call in am, scheduling is closed.

## 2011-06-24 ENCOUNTER — Other Ambulatory Visit (HOSPITAL_COMMUNITY): Payer: Private Health Insurance - Indemnity

## 2011-06-24 NOTE — Telephone Encounter (Signed)
Pt r/s 06/29/11 at 10:30am at [facility]. Pt notified and she already has her prep kit.

## 2011-06-29 ENCOUNTER — Other Ambulatory Visit (HOSPITAL_COMMUNITY): Payer: Private Health Insurance - Indemnity

## 2011-07-02 ENCOUNTER — Ambulatory Visit (HOSPITAL_COMMUNITY)
Admission: RE | Admit: 2011-07-02 | Discharge: 2011-07-02 | Disposition: A | Discharge status: Home / Self Care | Payer: Private Health Insurance - Indemnity | Source: Ambulatory Visit | Attending: Internal Medicine | Admitting: Internal Medicine

## 2011-07-02 DIAGNOSIS — R1031 Right lower quadrant pain: Secondary | ICD-10-CM | POA: Insufficient documentation

## 2011-07-02 DIAGNOSIS — R110 Nausea: Secondary | ICD-10-CM

## 2011-09-13 ENCOUNTER — Other Ambulatory Visit: Payer: Self-pay | Admitting: Sports Medicine

## 2011-09-13 DIAGNOSIS — M47817 Spondylosis without myelopathy or radiculopathy, lumbosacral region: Secondary | ICD-10-CM

## 2011-09-16 NOTE — Telephone Encounter (Signed)
Pt scheduled with PA.

## 2011-09-20 ENCOUNTER — Ambulatory Visit
Admission: RE | Admit: 2011-09-20 | Discharge: 2011-09-20 | Disposition: A | Discharge status: Home / Self Care | Payer: Private Health Insurance - Indemnity | Source: Ambulatory Visit | Attending: Sports Medicine | Admitting: Sports Medicine

## 2011-09-20 DIAGNOSIS — M47817 Spondylosis without myelopathy or radiculopathy, lumbosacral region: Secondary | ICD-10-CM

## 2011-09-20 MED ORDER — METHYLPREDNISOLONE ACETATE 40 MG/ML INJ SUSP (RADIOLOG
120.0000 mg | Freq: Once | INTRAMUSCULAR | Status: AC
  Administered 2011-09-20: 120 mg via EPIDURAL

## 2011-09-20 MED ORDER — IOHEXOL 180 MG/ML  SOLN
1.0000 mL | Freq: Once | INTRAMUSCULAR | Status: AC | PRN
  Administered 2011-09-20: 1 mL via EPIDURAL

## 2011-09-20 NOTE — Patient Instructions (Signed)

## 2011-10-05 NOTE — Telephone Encounter (Signed)
done

## 2011-10-13 ENCOUNTER — Other Ambulatory Visit: Payer: Self-pay | Admitting: Sports Medicine

## 2011-10-13 DIAGNOSIS — M549 Dorsalgia, unspecified: Secondary | ICD-10-CM

## 2011-10-15 ENCOUNTER — Ambulatory Visit
Admission: RE | Admit: 2011-10-15 | Discharge: 2011-10-15 | Disposition: A | Discharge status: Home / Self Care | Payer: Private Health Insurance - Indemnity | Source: Ambulatory Visit | Attending: Sports Medicine | Admitting: Sports Medicine

## 2011-10-15 DIAGNOSIS — M549 Dorsalgia, unspecified: Secondary | ICD-10-CM

## 2011-10-15 MED ORDER — IOHEXOL 180 MG/ML  SOLN
1.0000 mL | Freq: Once | INTRAMUSCULAR | Status: AC | PRN
  Administered 2011-10-15: 1 mL via EPIDURAL

## 2011-10-15 MED ORDER — METHYLPREDNISOLONE ACETATE 40 MG/ML INJ SUSP (RADIOLOG
120.0000 mg | Freq: Once | INTRAMUSCULAR | Status: AC
  Administered 2011-10-15: 120 mg via EPIDURAL

## 2011-11-17 ENCOUNTER — Encounter (INDEPENDENT_AMBULATORY_CARE_PROVIDER_SITE_OTHER): Payer: Self-pay | Admitting: Surgery

## 2011-11-17 ENCOUNTER — Ambulatory Visit (INDEPENDENT_AMBULATORY_CARE_PROVIDER_SITE_OTHER): Payer: Private Health Insurance - Indemnity | Admitting: Surgery

## 2011-11-17 DIAGNOSIS — E669 Obesity, unspecified: Secondary | ICD-10-CM | POA: Insufficient documentation

## 2011-11-17 NOTE — Progress Notes (Signed)
Re:   MONITA SWIER DOB:   Oct 18, 1961 MRN:   [id number]  ASSESSMENT AND PLAN: 1.  Morbid obesity.  Per the 1991 NIH Consensus Statement, the patient is a candidate for bariatric surgery.  The patient attended our information session and reviewed the different types of bariatric surgery.    The patient is interested in the laparoscopic adjustable gastric band.  I discussed with the patient the indications and risks of lap band surgery.  The potential risks of surgery include, but are not limited to, bleeding, infection, DVT and PE, slippage and erosion of the band, open surgery, and death.  The patient understands the importance of compliance and long term follow-up with our group after surgery.  She was given literature regarding lap band surgery and encouraged to visit their web site, www.lapband.com, and register.  She already has a book on lap bands and has read this information.  From here we'll obtain labs (she had recent lab work by Dr. Tenny Craw), x-rays, nutrition consult, and psych consult.  2.  Arthritis of the knees, see Dr. Otis Brace  3.  Hyperlipidemia.  Chief Complaint  Patient presents with  . Bariatric Pre-op    Lap Band Initial   REFERRING PHYSICIAN: ROSS,ALLAN, MD  HISTORY OF PRESENT ILLNESS: PALYN SCRIMA is a 50 y.o. (DOB: Jun 01, 1961)   female whose primary care physician is [facility], MD and comes to me today for bariatric surgery, she wants to do a lap band.   The patient went to an information sessions and heard Dr. Wenda Low speak. She has a friend who has a LAP-BAND and has lost about 40 pounds.  She has tried multiple diets, specifically Weight Watchers and herbal life. She has been a successful counting her calories and exercise. She lost over 70 pounds with this method.  She has never tried prescription medicines for weight loss. But she did try an over-the-counter medicine, but this caused palpitations and she stopped the medicine. She had a colonoscopy by  Dr. Diamond Nickel just a few months ago.  She has no history of stomach disease.  No history of liver disease.  No history of gall bladder disease.  No history of pancreas disease.  No history of colon disease.   Past Medical History  Diagnosis Date  . Arthritis       Past Surgical History  Procedure Date  . Abdominal hysterectomy 04/2000      Current Outpatient Prescriptions  Medication Sig Dispense Refill  . ibuprofen (ADVIL,MOTRIN) 200 MG tablet Take 200 mg by mouth as needed.        . Cholecalciferol (VITAMIN D) 2000 UNITS CAPS Take 1 capsule by mouth daily.        Marland Kitchen glucosamine-chondroitin 500-400 MG tablet Take 1 tablet by mouth 3 (three) times daily.        . ondansetron (ZOFRAN) 4 MG tablet Take 1 tablet (4 mg total) by mouth daily as needed for nausea.  10 tablet  0  . pantoprazole (PROTONIX) 40 MG tablet Take 1 tablet (40 mg total) by mouth daily.  30 tablet  1   Current Facility-Administered Medications  Medication Dose Route Frequency Provider Last Rate Last Dose  . 0.9 %  sodium chloride infusion  500 mL Intravenous Continuous Hart Carwin, MD          Allergies  Allergen Reactions  . Sulfa Antibiotics Shortness Of Breath and Swelling    REVIEW OF SYSTEMS: Skin:  No history of rash.  No history of abnormal moles. Infection:  No history of hepatitis or HIV.  No history of MRSA. Neurologic:  No history of stroke.  No history of seizure.  No history of headaches. Cardiac:  No history of hypertension. No history of heart disease.  No history of prior cardiac catheterization.  No history of seeing a cardiologist. Pulmonary:  Does not smoke cigarettes.  No asthma or bronchitis.  No OSA/CPAP.  Endocrine:  No diabetes. No thyroid disease. Gastrointestinal:  See HPI. GYN:  Hysterectomy 2001 for fibroids. Urologic:  No history of kidney stones.  No history of bladder infections. Musculoskeletal:  Sees Dr. Otis Brace for knee pain/arthritis.  Has never had  surgery. Hematologic:  No bleeding disorder.  No history of anemia.  Not anticoagulated. Psycho-social:  The patient is oriented.   The patient has no obvious psychologic or social impairment to understanding our conversation and plan.  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 She is marries and has one daughter 83 YO. She does not work.  PHYSICAL EXAM: BP 134/88  Pulse 68  Temp(Src) 97.8 F (36.6 C) (Temporal)  Resp 16  Ht 5' 5.5" (1.664 m)  Wt 215 lb 6.4 oz (97.705 kg)  BMI 35.30 kg/m2  General: WN F, obese, who is alert and generally healthy appearing.  HEENT: Normal. Pupils equal. Good dentition. Neck: Supple. No mass.  No thyroid mass.  Carotid pulse okay with no bruit. Lymph Nodes:  No supraclavicular or cervical nodes. Lungs: Clear to auscultation and symmetric breath sounds. Heart:  RRR. No murmur or rub. Breasts:  No mass.  Just had mammograms recently.  Abdomen: Soft. No mass. No tenderness. No hernia. Normal bowel sounds.  Pfannenstiel incision, healed.  More pear than apple in shape. Rectal: Not done.  Had recent colonoscopy. Extremities:  Good strength and ROM  in upper and lower extremities. Neurologic:  Grossly intact to motor and sensory function. Psychiatric: Has normal mood and affect. Behavior is normal.   DATA REVIEWED: Letter from Dr. Tenny Craw.  Ovidio Kin, MD,  [facility] [address] [place]  [address]   [place], [place]    [address] Phone:  [phone] FAX:  [phone]

## 2011-11-24 ENCOUNTER — Telehealth (INDEPENDENT_AMBULATORY_CARE_PROVIDER_SITE_OTHER): Payer: Self-pay | Admitting: Surgery

## 2011-11-24 ENCOUNTER — Encounter: Payer: Private Health Insurance - Indemnity | Attending: Surgery | Admitting: *Deleted

## 2011-11-24 ENCOUNTER — Encounter: Payer: Self-pay | Admitting: *Deleted

## 2011-11-24 DIAGNOSIS — Z713 Dietary counseling and surveillance: Secondary | ICD-10-CM | POA: Insufficient documentation

## 2011-11-24 NOTE — Patient Instructions (Addendum)
Goals:  Eat 3 meals/day, Avoid meal skipping .  Increase protein rich foods at all meals/snacks.  Limit carbohydrate to 45 g/meal and 15 g/snack.   Choose more whole grains, lean protein, low-fat dairy, and fruits/non-starchy vegetables.   Aim for >30 min of physical activity daily - try water activity or air stepper.   Limit sugar-sweetened beverages and concentrated sweets; Herbal Life tea is ok if decaf and no sugar.  Practice Pre-Op goals handout.  Estimated energy needs: (subject to change after Bariatric Surgery Assessment) 1200-1300 calories 150-165 g carbohydrates 75-80 g protein 35 g fat

## 2011-11-24 NOTE — Progress Notes (Signed)
Medical Nutrition Therapy:  Appt start time: 1500 end time:  1600.  Assessment:  Obesity/Supervised Weight Management. Pt here for first of 3 supervised weight management visits prior to assessment for bariatric surgery.  Reports UBW of 140 lbs until diagnosed with degenerative disk disease in her lumbar spine (followed by Dr. Ilean China for pain mgmt). Gained 75 lbs over next 2 years d/t inability to exercise (from pain) and mindless eating. Pt reports "craving" sweets sometimes and will eat a whole box of Little Debbie snack cakes in one sitting; states she only does this every other month or so.  Pt plans to start exercising on an "air stepper" and discussed water activity.  No FMH of obesity or overweight except oldest sister.   MEDICATIONS: See medication list; reconciled with pt. States she is NOT taking zofran or protonix.   DIETARY INTAKE:  Usual eating pattern includes 2 meals and several snacks per day. Drinks coffee with creamer, Herbal Life decaf tea, and water daily.  Avoids fried foods and eating out. Consumes baked chicken often. Snacks on small bags of baked cheetos, though often has 2 bags at a time.  Usual physical activity:  None d/t back pain from degenerative disk disease in lumbar spine.  Plans to start back soon.  Estimated energy needs: 1200-1300 calories 150-165 g carbohydrates 75-80 g protein 35 g fat  Progress Towards Goal(s):  In progress.   Nutritional Diagnosis:  [place]-3.3 Obesity related to back pain and lack of exercise as evidenced by patient-reported degenerative disk disease with sedentary lifestyle and 75 lb weight gain over last last two years.    Intervention/Goals:  Eat 3 meals/day, Avoid meal skipping.  Increase protein rich foods at all meals/snacks.  Limit carbohydrate to 45 g/meal and 15 g/snack; calories to 1200-1300/day (Follow yellow card).  Choose more whole grains, lean protein, low-fat dairy, and fruits/non-starchy vegetables.   Aim for >30  min of physical activity daily - try water activity or air stepper.   Limit sugar-sweetened beverages and concentrated sweets.  Practice Pre-Op goals handout.  Handouts given during visit include:  Pre-Op goals  Protein shakes and Supplements handout  Lean protein handout  Monitoring/Evaluation:  Dietary intake, exercise, and body weight in 4 week(s).

## 2011-11-25 ENCOUNTER — Encounter: Payer: Self-pay | Admitting: *Deleted

## 2011-11-25 LAB — COMPREHENSIVE METABOLIC PANEL
ALT: 19 U/L (ref 0–35)
AST: 16 U/L (ref 0–37)
Albumin: 4.1 g/dL (ref 3.5–5.2)
Alkaline Phosphatase: 43 U/L (ref 39–117)
BUN: 9 mg/dL (ref 6–23)
Calcium: 9 mg/dL (ref 8.4–10.5)
Chloride: 106 mEq/L (ref 96–112)
Creat: 0.79 mg/dL (ref 0.50–1.10)
Potassium: 3.8 mEq/L (ref 3.5–5.3)

## 2011-12-02 ENCOUNTER — Ambulatory Visit (HOSPITAL_COMMUNITY)
Admission: RE | Admit: 2011-12-02 | Discharge: 2011-12-02 | Disposition: A | Discharge status: Home / Self Care | Payer: Private Health Insurance - Indemnity | Source: Ambulatory Visit | Attending: Surgery | Admitting: Surgery

## 2011-12-02 ENCOUNTER — Encounter (INDEPENDENT_AMBULATORY_CARE_PROVIDER_SITE_OTHER): Payer: Self-pay

## 2011-12-02 ENCOUNTER — Telehealth (INDEPENDENT_AMBULATORY_CARE_PROVIDER_SITE_OTHER): Payer: Self-pay

## 2011-12-02 DIAGNOSIS — Z01818 Encounter for other preprocedural examination: Secondary | ICD-10-CM | POA: Insufficient documentation

## 2011-12-02 NOTE — Telephone Encounter (Signed)
Returned patients phone call. Told her there is not a test to check for allergy to silicone in lap band. Also told her per Dr Ezzard Standing there has yet to be a reaction in a lap band patient and it pretty much a disclaimer since it is a foreign body going into the body and that there is possibility of reaction. She was ok with what she was told.

## 2011-12-07 ENCOUNTER — Encounter (HOSPITAL_COMMUNITY)
Admission: RE | Disposition: A | Discharge status: Home / Self Care | Payer: Self-pay | Source: Ambulatory Visit | Attending: Surgery

## 2011-12-07 ENCOUNTER — Ambulatory Visit (HOSPITAL_COMMUNITY)
Admission: RE | Admit: 2011-12-07 | Discharge: 2011-12-07 | Disposition: A | Discharge status: Home / Self Care | Payer: Private Health Insurance - Indemnity | Source: Ambulatory Visit | Attending: Surgery | Admitting: Surgery

## 2011-12-07 DIAGNOSIS — Z01818 Encounter for other preprocedural examination: Secondary | ICD-10-CM | POA: Insufficient documentation

## 2011-12-07 HISTORY — PX: BREATH TEK H PYLORI: SHX5422

## 2011-12-07 SURGERY — BREATH TEST, FOR HELICOBACTER PYLORI

## 2011-12-08 ENCOUNTER — Encounter (HOSPITAL_COMMUNITY): Payer: Self-pay

## 2011-12-15 ENCOUNTER — Encounter (HOSPITAL_COMMUNITY): Payer: Self-pay | Admitting: Surgery

## 2011-12-27 ENCOUNTER — Ambulatory Visit: Payer: Private Health Insurance - Indemnity | Admitting: *Deleted

## 2012-06-07 ENCOUNTER — Other Ambulatory Visit (HOSPITAL_COMMUNITY): Payer: Self-pay | Admitting: Obstetrics and Gynecology

## 2012-06-07 DIAGNOSIS — R102 Pelvic and perineal pain: Secondary | ICD-10-CM

## 2012-06-12 ENCOUNTER — Other Ambulatory Visit: Payer: Self-pay | Admitting: Obstetrics and Gynecology

## 2012-06-12 DIAGNOSIS — R928 Other abnormal and inconclusive findings on diagnostic imaging of breast: Secondary | ICD-10-CM

## 2012-06-22 ENCOUNTER — Other Ambulatory Visit: Payer: Private Health Insurance - Indemnity

## 2012-06-28 ENCOUNTER — Encounter (HOSPITAL_COMMUNITY): Payer: Self-pay | Admitting: Emergency Medicine

## 2012-06-28 ENCOUNTER — Emergency Department (HOSPITAL_COMMUNITY)
Admission: EM | Admit: 2012-06-28 | Discharge: 2012-06-28 | Disposition: A | Discharge status: Home / Self Care | Payer: Private Health Insurance - Indemnity | Attending: Emergency Medicine | Admitting: Emergency Medicine

## 2012-06-28 DIAGNOSIS — M542 Cervicalgia: Secondary | ICD-10-CM

## 2012-06-28 DIAGNOSIS — M47812 Spondylosis without myelopathy or radiculopathy, cervical region: Secondary | ICD-10-CM | POA: Insufficient documentation

## 2012-06-28 DIAGNOSIS — Z882 Allergy status to sulfonamides status: Secondary | ICD-10-CM | POA: Insufficient documentation

## 2012-06-28 MED ORDER — OXYCODONE-ACETAMINOPHEN 5-325 MG PO TABS: 1.0000 | ORAL_TABLET | ORAL | Status: AC | PRN

## 2012-06-28 MED ORDER — DIAZEPAM 10 MG PO TABS: 10.0000 mg | ORAL_TABLET | Freq: Four times a day (QID) | ORAL | Status: AC | PRN

## 2012-06-28 MED ORDER — KETOROLAC TROMETHAMINE 60 MG/2ML IM SOLN
60.0000 mg | Freq: Once | INTRAMUSCULAR | Status: AC
  Administered 2012-06-28: 60 mg via INTRAMUSCULAR
  Filled 2012-06-28: qty 2

## 2012-06-28 MED ORDER — PREDNISONE 20 MG PO TABS: 20.0000 mg | ORAL_TABLET | Freq: Two times a day (BID) | ORAL | Status: AC

## 2012-06-28 NOTE — ED Provider Notes (Cosign Needed)
History     CSN: [id number]  Arrival date & time 06/28/12  0711   First MD Initiated Contact with Patient 06/28/12 [phone]      Chief Complaint  Patient presents with  . Neck Pain    (Consider location/radiation/quality/duration/timing/severity/associated sxs/prior treatment) HPI Comments: ALYRA PATTY is a 51 y.o. Female who awoke several days ago with neck pain. It is bilateral and lower. It is worse with movement and improves with rest. She took hydrocodone without relief. She had this about one year ago, and was evaluated with MRI. MRI showed degenerative joint and degenerative disc disease. She denies fever, chills, nausea, vomiting. She has normal ambulation. She drove to the emergency department.  Patient is a 51 y.o. female presenting with neck pain. The history is provided by the patient.  Neck Pain     Past Medical History  Diagnosis Date  . 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 Knee pain, right   . Degenerative disk disease 2010    Lumbar spine  . Obesity     Past Surgical History  Procedure Date  . Abdominal hysterectomy 04/2000  . Breath tek h pylori 12/07/2011    Procedure: BREATH TEK H PYLORI;  Surgeon: Kandis Cocking, MD;  Location: [facility];  Service: Endoscopy;  Laterality: N/A;    Family History  Problem Relation Age of Onset  . Colon cancer Father   . Cancer Father     colon  . Colon polyps Brother   . Cancer Sister     breast  . Obesity Sister     History  Substance Use Topics  . Smoking status: Never Smoker   . Smokeless tobacco: Never Used  . Alcohol Use: No    OB History    Grav Para Term Preterm Abortions TAB SAB Ect Mult Living                  Review of Systems  HENT: Positive for neck pain.   All other systems reviewed and are negative.    Allergies  Sulfa antibiotics  Home Medications   Current Outpatient Rx  Name Route Sig Dispense Refill  . VITAMIN D 2000 UNITS PO CAPS Oral Take 1 capsule by mouth daily.      Marland Kitchen  HYDROCODONE-ACETAMINOPHEN 5-325 MG PO TABS Oral Take 1 tablet by mouth every 6 (six) hours as needed. pain    . IBUPROFEN 200 MG PO TABS Oral Take 400 mg by mouth every 6 (six) hours as needed. pain    . DIAZEPAM 10 MG PO TABS Oral Take 1 tablet (10 mg total) by mouth every 6 (six) hours as needed (muscle spasm). 20 tablet 0  . OXYCODONE-ACETAMINOPHEN 5-325 MG PO TABS Oral Take 1 tablet by mouth every 4 (four) hours as needed for pain. 30 tablet 0  . PREDNISONE 20 MG PO TABS Oral Take 1 tablet (20 mg total) by mouth 2 (two) times daily. 10 tablet 0    BP 125/99  Pulse 86  Temp 98.2 F (36.8 C) (Oral)  Resp 20  SpO2 100%  Physical Exam  Nursing note and vitals reviewed. Constitutional: She is oriented to person, place, and time. She appears well-developed and well-nourished.  HENT:  Head: Normocephalic and atraumatic.  Eyes: Conjunctivae and EOM are normal. Pupils are equal, round, and reactive to light.  Neck: Normal range of motion and phonation normal. Neck supple.  Cardiovascular: Normal rate, regular rhythm and intact distal pulses.   Pulmonary/Chest:  Effort normal and breath sounds normal. She exhibits no tenderness.  Abdominal: Soft. She exhibits no distension. There is no tenderness. There is no guarding.  Musculoskeletal:       Mild bilateral paracervical spine tenderness. Normal. Neck range of motion. No bowel tenderness of the back.  Neurological: She is alert and oriented to person, place, and time. She has normal strength. No cranial nerve deficit. She exhibits normal muscle tone. Coordination normal.  Skin: Skin is warm and dry.  Psychiatric: She has a normal mood and affect. Her behavior is normal. Judgment and thought content normal.    ED Course  Procedures (including critical care time)  Labs Reviewed - No data to display No results found.   1. Neck pain       MDM  Recurrent neck pain, secondary to degenerative joint disease. Doubt spinal stenosis,  discitis, occult infection, subarachnoid hemorrhage.  Plan: Home Medications- Percocet, Valium, Prednisone; Home Treatments- heat; Recommended follow up- Ortho f/u 1 week      Flint Melter, MD 06/28/12 [phone]

## 2012-06-28 NOTE — ED Notes (Signed)
Pt states she began having neck pain last Friday.  Pt states she has had neck muscle spasms before like this a year ago.  Saw Dr. Remigio Eisenmenger and had MRI which showed inflammation.  Neck pain went away after treatment for 6-7 months only to return last week.  Pt states both side of neck hurt with tightness spreading into shoulders.  Denies HA.

## 2012-06-30 ENCOUNTER — Ambulatory Visit
Admission: RE | Admit: 2012-06-30 | Discharge: 2012-06-30 | Disposition: A | Discharge status: Home / Self Care | Payer: Private Health Insurance - Indemnity | Source: Ambulatory Visit | Attending: Obstetrics and Gynecology | Admitting: Obstetrics and Gynecology

## 2012-06-30 ENCOUNTER — Other Ambulatory Visit: Payer: Self-pay | Admitting: Obstetrics and Gynecology

## 2012-06-30 DIAGNOSIS — R928 Other abnormal and inconclusive findings on diagnostic imaging of breast: Secondary | ICD-10-CM

## 2012-06-30 HISTORY — PX: OTHER SURGICAL HISTORY: SHX169

## 2012-07-03 ENCOUNTER — Ambulatory Visit
Admission: RE | Admit: 2012-07-03 | Discharge: 2012-07-03 | Disposition: A | Discharge status: Home / Self Care | Payer: Private Health Insurance - Indemnity | Source: Ambulatory Visit | Attending: Obstetrics and Gynecology | Admitting: Obstetrics and Gynecology

## 2012-07-03 ENCOUNTER — Other Ambulatory Visit: Payer: Self-pay | Admitting: Obstetrics and Gynecology

## 2012-07-03 DIAGNOSIS — N63 Unspecified lump in unspecified breast: Secondary | ICD-10-CM

## 2012-07-04 ENCOUNTER — Other Ambulatory Visit: Payer: Self-pay | Admitting: Obstetrics and Gynecology

## 2012-07-04 DIAGNOSIS — C50912 Malignant neoplasm of unspecified site of left female breast: Secondary | ICD-10-CM

## 2012-07-07 ENCOUNTER — Ambulatory Visit
Admission: RE | Admit: 2012-07-07 | Discharge: 2012-07-07 | Disposition: A | Discharge status: Home / Self Care | Payer: Private Health Insurance - Indemnity | Source: Ambulatory Visit | Attending: Obstetrics and Gynecology | Admitting: Obstetrics and Gynecology

## 2012-07-07 DIAGNOSIS — C50912 Malignant neoplasm of unspecified site of left female breast: Secondary | ICD-10-CM

## 2012-07-07 DIAGNOSIS — C801 Malignant (primary) neoplasm, unspecified: Secondary | ICD-10-CM

## 2012-07-07 HISTORY — DX: Malignant (primary) neoplasm, unspecified: C80.1

## 2012-07-07 MED ORDER — GADOBENATE DIMEGLUMINE 529 MG/ML IV SOLN
15.0000 mL | Freq: Once | INTRAVENOUS | Status: AC | PRN
  Administered 2012-07-07: 15 mL via INTRAVENOUS

## 2012-07-10 ENCOUNTER — Other Ambulatory Visit (INDEPENDENT_AMBULATORY_CARE_PROVIDER_SITE_OTHER): Payer: Self-pay

## 2012-07-10 ENCOUNTER — Ambulatory Visit (HOSPITAL_COMMUNITY): Payer: Private Health Insurance - Indemnity

## 2012-07-10 ENCOUNTER — Encounter (INDEPENDENT_AMBULATORY_CARE_PROVIDER_SITE_OTHER): Payer: Self-pay | Admitting: Surgery

## 2012-07-10 ENCOUNTER — Ambulatory Visit (INDEPENDENT_AMBULATORY_CARE_PROVIDER_SITE_OTHER): Payer: Private Health Insurance - Indemnity | Admitting: Surgery

## 2012-07-10 ENCOUNTER — Other Ambulatory Visit (INDEPENDENT_AMBULATORY_CARE_PROVIDER_SITE_OTHER): Payer: Self-pay | Admitting: Surgery

## 2012-07-10 VITALS — BP 148/98 | HR 76 | Temp 98.4°F | Resp 16 | Ht 66.0 in | Wt 209.8 lb

## 2012-07-10 DIAGNOSIS — C50912 Malignant neoplasm of unspecified site of left female breast: Secondary | ICD-10-CM

## 2012-07-10 DIAGNOSIS — Z853 Personal history of malignant neoplasm of breast: Secondary | ICD-10-CM | POA: Insufficient documentation

## 2012-07-10 DIAGNOSIS — C50919 Malignant neoplasm of unspecified site of unspecified female breast: Secondary | ICD-10-CM

## 2012-07-10 DIAGNOSIS — C50512 Malignant neoplasm of lower-outer quadrant of left female breast: Secondary | ICD-10-CM | POA: Insufficient documentation

## 2012-07-10 NOTE — Progress Notes (Addendum)
 Re:   Joann Page DOB:   08/01/1961 MRN:   [id number]  ASSESSMENT AND PLAN: 1.  Left breast cancer, 12 o'clock  2.3 cm by MRI, 1.2 x 1.1 cm on US.  ER - 100%, PR - 31%, Her2Neu - neg, Ki-67 - 40% (presented at Breast Ca Conf - 07/12/2012)  I discussed the options for breast cancer treatment with the patient.  I discussed the idea of a multidisciplinary approach to the treatment of breast cancer, which often includes medical oncology and radiation oncology.  I discussed the surgical options of lumpectomy vs. mastectomy.  I discussed the options of lymph node biopsy.  The treatment plan depends on the pathologic staging of the tumor and the patient's personal wishes.  The risks of surgery include, but are not limited to, bleeding, infection, the need for further surgery, and nerve injury.  The patient has been given literature on the treatment of breast cancer.  She will also need genetic counseling.  Plan:  Wire loc left lumpectomy, left axillary SLNBx, genetics consultation  2.  Morbid obesity.  She decided not to go through with a lap band.  I saw her in December, 2012.  She has lost 6 pounds since I saw her.  She says this has probably been in the last 2 weeks, because of what is going on with her breast. 3.  Arthritis of the knees, see Dr. J. Kramer  4.  Hyperlipidemia.  No chief complaint on file.  REFERRING PHYSICIAN:  Dr. K. Brozzetti  HISTORY OF PRESENT ILLNESS: Joann Page is a 51 y.o. (DOB: 08/11/1961)   female whose primary care physician is ROSS,ALLAN, MD and comes to me today for breast cancer and she comes today for further evaluation and plans.  She had an abnormality identified on mammogram and underwent 2 left core biopsies.  The first, 06/30/2012, was negative.  The second, 07/03/2012, showed invasive ductal ca. She has no prior history of breast abnormalities.  She had a hysterectomy in 2001 for fibroids.  Still has her ovaries and is no hormone meds.  Her sister,  Shapelle's mother, had breast cancer in her late 20's and died of breast cancer.  This is the only family member with breast cancer.   Past Medical History  Diagnosis Date  . 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 Knee pain, right   . Degenerative disk disease 2010    Lumbar spine  . Obesity   . Cancer 07/07/12    Left Breast Inv Ductal Ca.      Past Surgical History  Procedure Date  . Abdominal hysterectomy 04/2000  . Breath tek h pylori 12/07/2011    Procedure: BREATH TEK H PYLORI;  Surgeon: Tawn Fitzner H Zaley Talley, MD;  Location: [facility];  Service: Endoscopy;  Laterality: N/A;      Current Outpatient Prescriptions  Medication Sig Dispense Refill  . diazepam (VALIUM) 10 MG tablet Take 10 mg by mouth every 6 (six) hours as needed.      . HYDROcodone-acetaminophen (NORCO/VICODIN) 5-325 MG per tablet Take 1 tablet by mouth every 6 (six) hours as needed. pain      . ibuprofen (ADVIL,MOTRIN) 200 MG tablet Take 400 mg by mouth every 6 (six) hours as needed. pain      . Cholecalciferol (VITAMIN D) 2000 UNITS CAPS Take 1 capsule by mouth daily.         Current Facility-Administered Medications  Medication Dose Route Frequency Provider Last Rate Last Dose  .   0.9 %  sodium chloride infusion  500 mL Intravenous Continuous Dora M Brodie, MD          Allergies  Allergen Reactions  . Sulfa Antibiotics Shortness Of Breath and Swelling    REVIEW OF SYSTEMS: Skin:  No history of rash.  No history of abnormal moles. Infection:  No history of hepatitis or HIV.  No history of MRSA. Neurologic:  No history of stroke.  No history of seizure.  No history of headaches. Cardiac:  No history of hypertension. No history of heart disease.  No history of prior cardiac catheterization.  No history of seeing a cardiologist. Pulmonary:  Does not smoke cigarettes.  No asthma or bronchitis.  No OSA/CPAP.  Endocrine:  No diabetes. No thyroid disease. Gastrointestinal:  See HPI. GYN:  Hysterectomy 2001 for  fibroids. Urologic:  No history of kidney stones.  No history of bladder infections. Musculoskeletal:  Sees Dr. J. Kramer for knee pain/arthritis.  Has never had surgery. Hematologic:  No bleeding disorder.  No history of anemia.  Not anticoagulated. Psycho-social:  The patient is oriented.   The patient has no obvious psychologic or social impairment to understanding our conversation and plan.  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 She does not work.  PHYSICAL EXAM: BP 148/98  Pulse 76  Temp 98.4 F (36.9 C) (Temporal)  Resp 16  Ht 5' 6" (1.676 m)  Wt 209 lb 12.8 oz (95.165 kg)  BMI 33.86 kg/m2  General: WN F, obese, who is alert and generally healthy appearing.  HEENT: Normal. Pupils equal. Good dentition. Neck: Supple. No mass.  No thyroid mass.  Carotid pulse okay with no bruit. Lymph Nodes:  No supraclavicular or cervical nodes. Lungs: Clear to auscultation and symmetric breath sounds. Heart:  RRR. No murmur or rub. Breasts:  No mass.  Just had mammograms recently.  Abdomen: Soft. No mass. No tenderness. No hernia. Normal bowel sounds.  Pfannenstiel incision, healed.  More pear than apple in shape. Rectal: Not done.  Had recent colonoscopy. Extremities:  Good strength and ROM  in upper and lower extremities. Neurologic:  Grossly intact to motor and sensory function. Psychiatric: Has normal mood and affect. Behavior is normal.   DATA REVIEWED: [facility] from [facility].  Eugune Sine, MD,  [facility] [address]  [address]   [place], [place]    [address] Phone:  [phone] FAX:  [phone]  

## 2012-07-11 ENCOUNTER — Other Ambulatory Visit (INDEPENDENT_AMBULATORY_CARE_PROVIDER_SITE_OTHER): Payer: Self-pay | Admitting: Surgery

## 2012-07-11 DIAGNOSIS — C50919 Malignant neoplasm of unspecified site of unspecified female breast: Secondary | ICD-10-CM

## 2012-07-12 ENCOUNTER — Encounter (HOSPITAL_BASED_OUTPATIENT_CLINIC_OR_DEPARTMENT_OTHER): Payer: Self-pay | Admitting: *Deleted

## 2012-07-12 ENCOUNTER — Telehealth: Payer: Self-pay | Admitting: *Deleted

## 2012-07-12 NOTE — Telephone Encounter (Signed)
Confirmed 08/28/12 genetic appt w/ pt.  Emailed Huntley Dec at [facility] to make aware.

## 2012-07-12 NOTE — Progress Notes (Signed)
No labs needed-no cardiac or resp problems  

## 2012-07-13 ENCOUNTER — Other Ambulatory Visit (HOSPITAL_COMMUNITY): Payer: Private Health Insurance - Indemnity

## 2012-07-13 ENCOUNTER — Ambulatory Visit: Admit: 2012-07-13 | Payer: Self-pay | Admitting: Surgery

## 2012-07-13 SURGERY — BREAST LUMPECTOMY WITH AXILLARY LYMPH NODE BIOPSY
Anesthesia: General | Laterality: Left

## 2012-07-14 ENCOUNTER — Encounter (INDEPENDENT_AMBULATORY_CARE_PROVIDER_SITE_OTHER): Payer: Private Health Insurance - Indemnity | Admitting: Surgery

## 2012-07-14 ENCOUNTER — Telehealth (INDEPENDENT_AMBULATORY_CARE_PROVIDER_SITE_OTHER): Payer: Self-pay

## 2012-07-14 NOTE — Telephone Encounter (Signed)
Patient called to request we change her pharmacy and I did.

## 2012-07-17 ENCOUNTER — Ambulatory Visit (HOSPITAL_BASED_OUTPATIENT_CLINIC_OR_DEPARTMENT_OTHER): Payer: Managed Care, Other (non HMO) | Admitting: Anesthesiology

## 2012-07-17 ENCOUNTER — Ambulatory Visit
Admission: RE | Admit: 2012-07-17 | Discharge: 2012-07-17 | Disposition: A | Discharge status: Home / Self Care | Payer: Private Health Insurance - Indemnity | Source: Ambulatory Visit | Attending: Surgery | Admitting: Surgery

## 2012-07-17 ENCOUNTER — Ambulatory Visit (HOSPITAL_COMMUNITY): Payer: Private Health Insurance - Indemnity

## 2012-07-17 ENCOUNTER — Ambulatory Visit (HOSPITAL_BASED_OUTPATIENT_CLINIC_OR_DEPARTMENT_OTHER)
Admission: RE | Admit: 2012-07-17 | Discharge: 2012-07-17 | Disposition: A | Discharge status: Home / Self Care | Payer: Managed Care, Other (non HMO) | Source: Ambulatory Visit | Attending: Surgery | Admitting: Surgery

## 2012-07-17 ENCOUNTER — Encounter (HOSPITAL_BASED_OUTPATIENT_CLINIC_OR_DEPARTMENT_OTHER): Payer: Self-pay | Admitting: Anesthesiology

## 2012-07-17 ENCOUNTER — Encounter (HOSPITAL_BASED_OUTPATIENT_CLINIC_OR_DEPARTMENT_OTHER)
Admission: RE | Disposition: A | Discharge status: Home / Self Care | Payer: Self-pay | Source: Ambulatory Visit | Attending: Surgery

## 2012-07-17 ENCOUNTER — Ambulatory Visit (HOSPITAL_COMMUNITY)
Admission: RE | Admit: 2012-07-17 | Discharge: 2012-07-17 | Disposition: A | Discharge status: Home / Self Care | Payer: Managed Care, Other (non HMO) | Source: Ambulatory Visit | Attending: Surgery | Admitting: Surgery

## 2012-07-17 DIAGNOSIS — C50919 Malignant neoplasm of unspecified site of unspecified female breast: Secondary | ICD-10-CM | POA: Insufficient documentation

## 2012-07-17 DIAGNOSIS — C50912 Malignant neoplasm of unspecified site of left female breast: Secondary | ICD-10-CM

## 2012-07-17 DIAGNOSIS — D0590 Unspecified type of carcinoma in situ of unspecified breast: Secondary | ICD-10-CM

## 2012-07-17 HISTORY — DX: Malignant neoplasm of unspecified site of unspecified female breast: C50.919

## 2012-07-17 HISTORY — DX: Other specified health status: Z78.9

## 2012-07-17 HISTORY — DX: Gastro-esophageal reflux disease without esophagitis: K21.9

## 2012-07-17 HISTORY — PX: BREAST LUMPECTOMY: SHX2

## 2012-07-17 HISTORY — DX: Personal history of other diseases of the digestive system: Z87.19

## 2012-07-17 SURGERY — BREAST LUMPECTOMY WITH NEEDLE LOCALIZATION AND AXILLARY SENTINEL LYMPH NODE BX
Anesthesia: General | Site: Breast | Laterality: Left | Wound class: Clean

## 2012-07-17 MED ORDER — FENTANYL CITRATE 0.05 MG/ML IJ SOLN
50.0000 ug | INTRAMUSCULAR | Status: DC | PRN
  Administered 2012-07-17: 50 ug via INTRAVENOUS

## 2012-07-17 MED ORDER — MIDAZOLAM HCL 5 MG/5ML IJ SOLN
INTRAMUSCULAR | Status: DC | PRN
  Administered 2012-07-17: 2 mg via INTRAVENOUS

## 2012-07-17 MED ORDER — HYDROMORPHONE HCL PF 1 MG/ML IJ SOLN
0.2500 mg | INTRAMUSCULAR | Status: DC | PRN
  Administered 2012-07-17: 0.25 mg via INTRAVENOUS
  Administered 2012-07-17: 0.5 mg via INTRAVENOUS
  Administered 2012-07-17: 0.25 mg via INTRAVENOUS
  Administered 2012-07-17 (×2): 0.5 mg via INTRAVENOUS

## 2012-07-17 MED ORDER — LIDOCAINE HCL (CARDIAC) 20 MG/ML IV SOLN
INTRAVENOUS | Status: DC | PRN
  Administered 2012-07-17: 100 mg via INTRAVENOUS

## 2012-07-17 MED ORDER — LACTATED RINGERS IV SOLN: INTRAVENOUS | Status: DC

## 2012-07-17 MED ORDER — PROPOFOL 10 MG/ML IV EMUL
INTRAVENOUS | Status: DC | PRN
  Administered 2012-07-17: 200 mg via INTRAVENOUS

## 2012-07-17 MED ORDER — TECHNETIUM TC 99M SULFUR COLLOID FILTERED
1.0000 | Freq: Once | INTRAVENOUS | Status: AC | PRN
  Administered 2012-07-17: 1 via INTRADERMAL

## 2012-07-17 MED ORDER — DEXAMETHASONE SODIUM PHOSPHATE 4 MG/ML IJ SOLN
INTRAMUSCULAR | Status: DC | PRN
  Administered 2012-07-17: 10 mg via INTRAVENOUS

## 2012-07-17 MED ORDER — BUPIVACAINE HCL (PF) 0.25 % IJ SOLN
INTRAMUSCULAR | Status: DC | PRN
  Administered 2012-07-17: 30 mL

## 2012-07-17 MED ORDER — FENTANYL CITRATE 0.05 MG/ML IJ SOLN
INTRAMUSCULAR | Status: DC | PRN
  Administered 2012-07-17: 100 ug via INTRAVENOUS
  Administered 2012-07-17: 50 ug via INTRAVENOUS

## 2012-07-17 MED ORDER — MIDAZOLAM HCL 2 MG/2ML IJ SOLN
0.5000 mg | INTRAMUSCULAR | Status: DC | PRN
  Administered 2012-07-17: 1 mg via INTRAVENOUS

## 2012-07-17 MED ORDER — LACTATED RINGERS IV SOLN
INTRAVENOUS | Status: DC
  Administered 2012-07-17: 20 mL/h via INTRAVENOUS
  Administered 2012-07-17 (×2): via INTRAVENOUS

## 2012-07-17 MED ORDER — SODIUM CHLORIDE 0.9 % IJ SOLN
INTRAMUSCULAR | Status: DC | PRN
  Administered 2012-07-17: 12:00:00 via INTRAMUSCULAR

## 2012-07-17 MED ORDER — ACETAMINOPHEN 10 MG/ML IV SOLN
1000.0000 mg | Freq: Once | INTRAVENOUS | Status: AC
  Administered 2012-07-17: 1000 mg via INTRAVENOUS

## 2012-07-17 MED ORDER — ONDANSETRON HCL 4 MG/2ML IJ SOLN
INTRAMUSCULAR | Status: DC | PRN
  Administered 2012-07-17: 4 mg via INTRAVENOUS

## 2012-07-17 MED ORDER — CEFAZOLIN SODIUM-DEXTROSE 2-3 GM-% IV SOLR
INTRAVENOUS | Status: DC | PRN
  Administered 2012-07-17: 3 g via INTRAVENOUS

## 2012-07-17 SURGICAL SUPPLY — 59 items
APPLIER CLIP 11 MED OPEN (CLIP)
BANDAGE ELASTIC 6 VELCRO ST LF (GAUZE/BANDAGES/DRESSINGS) IMPLANT
BINDER BREAST LRG (GAUZE/BANDAGES/DRESSINGS) IMPLANT
BINDER BREAST MEDIUM (GAUZE/BANDAGES/DRESSINGS) IMPLANT
BINDER BREAST XLRG (GAUZE/BANDAGES/DRESSINGS) IMPLANT
BINDER BREAST XXLRG (GAUZE/BANDAGES/DRESSINGS) ×2 IMPLANT
BLADE SURG 10 STRL SS (BLADE) ×2 IMPLANT
BLADE SURG 15 STRL LF DISP TIS (BLADE) ×1 IMPLANT
BLADE SURG 15 STRL SS (BLADE) ×1
CANISTER SUCTION 1200CC (MISCELLANEOUS) ×2 IMPLANT
CHLORAPREP W/TINT 26ML (MISCELLANEOUS) ×2 IMPLANT
CLIP APPLIE 11 MED OPEN (CLIP) IMPLANT
CLIP TI WIDE RED SMALL 6 (CLIP) ×2 IMPLANT
CLOTH BEACON ORANGE TIMEOUT ST (SAFETY) ×2 IMPLANT
COVER MAYO STAND STRL (DRAPES) ×2 IMPLANT
COVER PROBE W GEL 5X96 (DRAPES) ×2 IMPLANT
COVER TABLE BACK 60X90 (DRAPES) ×2 IMPLANT
DECANTER SPIKE VIAL GLASS SM (MISCELLANEOUS) IMPLANT
DERMABOND ADVANCED (GAUZE/BANDAGES/DRESSINGS) ×1
DERMABOND ADVANCED .7 DNX12 (GAUZE/BANDAGES/DRESSINGS) ×1 IMPLANT
DEVICE DUBIN W/COMP PLATE 8390 (MISCELLANEOUS) ×2 IMPLANT
DRAIN CHANNEL 19F RND (DRAIN) IMPLANT
DRAIN HEMOVAC 1/8 X 5 (WOUND CARE) IMPLANT
DRAPE LAPAROSCOPIC ABDOMINAL (DRAPES) ×2 IMPLANT
DRAPE UTILITY XL STRL (DRAPES) ×2 IMPLANT
ELECT COATED BLADE 2.86 ST (ELECTRODE) ×2 IMPLANT
ELECT REM PT RETURN 9FT ADLT (ELECTROSURGICAL) ×2
ELECTRODE REM PT RTRN 9FT ADLT (ELECTROSURGICAL) ×1 IMPLANT
EVACUATOR SILICONE 100CC (DRAIN) IMPLANT
GAUZE SPONGE 4X4 12PLY STRL LF (GAUZE/BANDAGES/DRESSINGS) IMPLANT
GAUZE SPONGE 4X4 16PLY XRAY LF (GAUZE/BANDAGES/DRESSINGS) IMPLANT
GLOVE ECLIPSE 6.5 STRL STRAW (GLOVE) ×2 IMPLANT
GLOVE SURG SIGNA 7.5 PF LTX (GLOVE) ×4 IMPLANT
GOWN PREVENTION PLUS XLARGE (GOWN DISPOSABLE) ×2 IMPLANT
GOWN PREVENTION PLUS XXLARGE (GOWN DISPOSABLE) ×2 IMPLANT
KIT MARKER MARGIN INK (KITS) ×2 IMPLANT
NDL SAFETY ECLIPSE 18X1.5 (NEEDLE) ×1 IMPLANT
NEEDLE HYPO 18GX1.5 SHARP (NEEDLE) ×1
NEEDLE HYPO 25X1 1.5 SAFETY (NEEDLE) ×4 IMPLANT
NS IRRIG 1000ML POUR BTL (IV SOLUTION) ×2 IMPLANT
PACK BASIN DAY SURGERY FS (CUSTOM PROCEDURE TRAY) ×2 IMPLANT
PAD ALCOHOL SWAB (MISCELLANEOUS) ×2 IMPLANT
PENCIL BUTTON HOLSTER BLD 10FT (ELECTRODE) ×2 IMPLANT
PIN SAFETY STERILE (MISCELLANEOUS) IMPLANT
SHEET MEDIUM DRAPE 40X70 STRL (DRAPES) ×2 IMPLANT
SLEEVE SCD COMPRESS KNEE MED (MISCELLANEOUS) ×2 IMPLANT
SPONGE GAUZE 4X4 12PLY (GAUZE/BANDAGES/DRESSINGS) ×2 IMPLANT
SPONGE LAP 18X18 X RAY DECT (DISPOSABLE) ×2 IMPLANT
SUT ETHILON 2 0 FS 18 (SUTURE) IMPLANT
SUT MON AB 5-0 PS2 18 (SUTURE) ×2 IMPLANT
SUT SILK 3 0 TIES 17X18 (SUTURE)
SUT SILK 3-0 18XBRD TIE BLK (SUTURE) IMPLANT
SUT VICRYL 3-0 CR8 SH (SUTURE) ×2 IMPLANT
SYR CONTROL 10ML LL (SYRINGE) ×4 IMPLANT
TOWEL OR 17X24 6PK STRL BLUE (TOWEL DISPOSABLE) ×2 IMPLANT
TOWEL OR NON WOVEN STRL DISP B (DISPOSABLE) ×2 IMPLANT
TUBE CONNECTING 20X1/4 (TUBING) ×2 IMPLANT
WATER STERILE IRR 1000ML POUR (IV SOLUTION) IMPLANT
YANKAUER SUCT BULB TIP NO VENT (SUCTIONS) ×2 IMPLANT

## 2012-07-17 NOTE — H&P (View-Only) (Signed)
Re:   Joann Page DOB:   23-Nov-1961 MRN:   [id number]  ASSESSMENT AND PLAN: 1.  Left breast cancer, 12 o'clock  2.3 cm by MRI, 1.2 x 1.1 cm on [place].  ER - 100%, PR - 31%, Her2Neu - neg, Ki-67 - 40% (presented at Breast Ca Conf - 07/12/2012)  I discussed the options for breast cancer treatment with the patient.  I discussed the idea of a multidisciplinary approach to the treatment of breast cancer, which often includes medical oncology and radiation oncology.  I discussed the surgical options of lumpectomy vs. mastectomy.  I discussed the options of lymph node biopsy.  The treatment plan depends on the pathologic staging of the tumor and the patient's personal wishes.  The risks of surgery include, but are not limited to, bleeding, infection, the need for further surgery, and nerve injury.  The patient has been given literature on the treatment of breast cancer.  She will also need genetic counseling.  Plan:  Wire loc left lumpectomy, left axillary SLNBx, genetics consultation  2.  Morbid obesity.  She decided not to go through with a lap band.  I saw her in December, 2012.  She has lost 6 pounds since I saw her.  She says this has probably been in the last 2 weeks, because of what is going on with her breast. 3.  Arthritis of the knees, see Dr. Otis Brace  4.  Hyperlipidemia.  No chief complaint on file.  REFERRING PHYSICIAN:  Dr. Lucita Ferrara  HISTORY OF PRESENT ILLNESS: Joann Page is a 51 y.o. (DOB: Sep 26, 1961)   female whose primary care physician is [facility], MD and comes to me today for breast cancer and she comes today for further evaluation and plans.  She had an abnormality identified on mammogram and underwent 2 left core biopsies.  The first, 06/30/2012, was negative.  The second, 07/03/2012, showed invasive ductal ca. She has no prior history of breast abnormalities.  She had a hysterectomy in 05-04-00 for fibroids.  Still has her ovaries and is no hormone meds.  Her sister,  Shapelle's mother, had breast cancer in her late 2023/05/05 and died of breast cancer.  This is the only family member with breast cancer.   Past Medical History  Diagnosis Date  . 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 Knee pain, right   . Degenerative disk disease 2009-05-04    Lumbar spine  . Obesity   . Cancer 07/07/12    Left Breast Inv Ductal Ca.      Past Surgical History  Procedure Date  . Abdominal hysterectomy 04/2000  . Breath tek h pylori 12/07/2011    Procedure: BREATH TEK H PYLORI;  Surgeon: Kandis Cocking, MD;  Location: [facility];  Service: Endoscopy;  Laterality: N/A;      Current Outpatient Prescriptions  Medication Sig Dispense Refill  . diazepam (VALIUM) 10 MG tablet Take 10 mg by mouth every 6 (six) hours as needed.      Marland Kitchen HYDROcodone-acetaminophen (NORCO/VICODIN) 5-325 MG per tablet Take 1 tablet by mouth every 6 (six) hours as needed. pain      . ibuprofen (ADVIL,MOTRIN) 200 MG tablet Take 400 mg by mouth every 6 (six) hours as needed. pain      . Cholecalciferol (VITAMIN D) 2000 UNITS CAPS Take 1 capsule by mouth daily.         Current Facility-Administered Medications  Medication Dose Route Frequency Provider Last Rate Last Dose  .  0.9 %  sodium chloride infusion  500 mL Intravenous Continuous Hart Carwin, MD          Allergies  Allergen Reactions  . Sulfa Antibiotics Shortness Of Breath and Swelling    REVIEW OF SYSTEMS: Skin:  No history of rash.  No history of abnormal moles. Infection:  No history of hepatitis or HIV.  No history of MRSA. Neurologic:  No history of stroke.  No history of seizure.  No history of headaches. Cardiac:  No history of hypertension. No history of heart disease.  No history of prior cardiac catheterization.  No history of seeing a cardiologist. Pulmonary:  Does not smoke cigarettes.  No asthma or bronchitis.  No OSA/CPAP.  Endocrine:  No diabetes. No thyroid disease. Gastrointestinal:  See HPI. GYN:  Hysterectomy 2001 for  fibroids. Urologic:  No history of kidney stones.  No history of bladder infections. Musculoskeletal:  Sees Dr. Otis Brace for knee pain/arthritis.  Has never had surgery. Hematologic:  No bleeding disorder.  No history of anemia.  Not anticoagulated. Psycho-social:  The patient is oriented.   The patient has no obvious psychologic or social impairment to understanding our conversation and plan.  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 She is marries and has one daughter 32 YO. She does not work.  PHYSICAL EXAM: BP 148/98  Pulse 76  Temp 98.4 F (36.9 C) (Temporal)  Resp 16  Ht 5\' 6"  (1.676 m)  Wt 209 lb 12.8 oz (95.165 kg)  BMI 33.86 kg/m2  General: WN F, obese, who is alert and generally healthy appearing.  HEENT: Normal. Pupils equal. Good dentition. Neck: Supple. No mass.  No thyroid mass.  Carotid pulse okay with no bruit. Lymph Nodes:  No supraclavicular or cervical nodes. Lungs: Clear to auscultation and symmetric breath sounds. Heart:  RRR. No murmur or rub. Breasts:  No mass.  Just had mammograms recently.  Abdomen: Soft. No mass. No tenderness. No hernia. Normal bowel sounds.  Pfannenstiel incision, healed.  More pear than apple in shape. Rectal: Not done.  Had recent colonoscopy. Extremities:  Good strength and ROM  in upper and lower extremities. Neurologic:  Grossly intact to motor and sensory function. Psychiatric: Has normal mood and affect. Behavior is normal.   DATA REVIEWED: Notes from the [facility].  Ovidio Kin, MD,  [facility] [address] [place]  [address]   [place], [place]    [address] Phone:  [phone] FAX:  [phone]

## 2012-07-17 NOTE — Interval H&P Note (Signed)
History and Physical Interval Note:  07/17/2012 12:04 PM  Joann Page  has presented today for surgery, with the diagnosis of LEFT BREAST CANCER  The various methods of treatment have been discussed with the patient and family.  Husband at bedside.  After consideration of risks, benefits and other options for treatment, the patient has consented to  Procedure(s) (LRB): BREAST LUMPECTOMY WITH NEEDLE LOCALIZATION AND AXILLARY SENTINEL LYMPH NODE BX (Left) as a surgical intervention .    The patient's history has been reviewed, patient examined, no change in status, stable for surgery.  I have reviewed the patient's chart and labs.  Questions were answered to the patient's satisfaction.     Cathlene Gardella H

## 2012-07-17 NOTE — Transfer of Care (Signed)
Immediate Anesthesia Transfer of Care Note  Patient: Joann Page  Procedure(s) Performed: Procedure(s) (LRB): BREAST LUMPECTOMY WITH NEEDLE LOCALIZATION AND AXILLARY SENTINEL LYMPH NODE BX (Left)  Patient Location: PACU  Anesthesia Type: General  Level of Consciousness: awake  Airway & Oxygen Therapy: Patient Spontanous Breathing and Patient connected to face mask oxygen  Post-op Assessment: Report given to PACU RN and Post -op Vital signs reviewed and stable  Post vital signs: Reviewed and stable  Complications: No apparent anesthesia complications

## 2012-07-17 NOTE — Anesthesia Postprocedure Evaluation (Signed)
  Anesthesia Post-op Note  Patient: Joann Page  Procedure(s) Performed: Procedure(s) (LRB): BREAST LUMPECTOMY WITH NEEDLE LOCALIZATION AND AXILLARY SENTINEL LYMPH NODE BX (Left)  Patient Location: PACU  Anesthesia Type: General  Level of Consciousness: awake  Airway and Oxygen Therapy: Patient Spontanous Breathing  Post-op Pain: mild  Post-op Assessment: Post-op Vital signs reviewed, Patient's Cardiovascular Status Stable, Respiratory Function Stable, Patent Airway and No signs of Nausea or vomiting  Post-op Vital Signs: Reviewed and stable  Complications: No apparent anesthesia complications

## 2012-07-17 NOTE — Anesthesia Preprocedure Evaluation (Signed)
Anesthesia Evaluation  Patient identified by MRN, date of birth, ID band Patient awake    Reviewed: Allergy & Precautions, H&P , NPO status , Patient's Chart, lab work & pertinent test results  Airway Mallampati: II TM Distance: >3 FB Neck ROM: Full    Dental No notable dental hx. (+) Teeth Intact and Dental Advisory Given   Pulmonary neg pulmonary ROS,  breath sounds clear to auscultation  Pulmonary exam normal       Cardiovascular negative cardio ROS  Rhythm:Regular Rate:Normal     Neuro/Psych negative neurological ROS  negative psych ROS   GI/Hepatic Neg liver ROS, hiatal hernia, GERD-  Controlled and Medicated,  Endo/Other  negative endocrine ROS  Renal/GU negative Renal ROS  negative genitourinary   Musculoskeletal   Abdominal   Peds  Hematology negative hematology ROS (+)   Anesthesia Other Findings   Reproductive/Obstetrics negative OB ROS                           Anesthesia Physical Anesthesia Plan  ASA: II  Anesthesia Plan: General   Post-op Pain Management:    Induction: Intravenous  Airway Management Planned: LMA  Additional Equipment:   Intra-op Plan:   Post-operative Plan: Extubation in OR  Informed Consent: I have reviewed the patients History and Physical, chart, labs and discussed the procedure including the risks, benefits and alternatives for the proposed anesthesia with the patient or authorized representative who has indicated his/her understanding and acceptance.   Dental advisory given  Plan Discussed with: CRNA  Anesthesia Plan Comments:         Anesthesia Quick Evaluation

## 2012-07-17 NOTE — Brief Op Note (Signed)
07/17/2012  1:44 PM  PATIENT:  Joann Page, 51 y.o., female, MRN: [id number]  PREOP DIAGNOSIS:  LEFT BREAST CANCER  POSTOP DIAGNOSIS:   Left breast cancer, 1 o'clock (T1, N0)  PROCEDURE:   Procedure(s): BREAST LUMPECTOMY WITH NEEDLE,  LOCALIZATION AND AXILLARY SENTINEL LYMPH NODE BX,  Injection of methylene blue (40%) - 0.5 cc  SURGEON:   Ovidio Kin, M.D.  ASSISTANT:   None  ANESTHESIA:   general  W. Autumn Patty, MD - Anesthesiologist Ronnette Hila, CRNA - CRNA  General  EBL:  <50   ml  BLOOD ADMINISTERED: none  DRAINS: none   LOCAL MEDICATIONS USED:   30 cc 1/4% marcaine  SPECIMEN:   Left breast biopsy, inferior-medial margin (lateral - orange - matches other specimen's inferior-medial margin)  COUNTS CORRECT:  YES  INDICATIONS FOR PROCEDURE:  Joann Page is a 51 y.o. (DOB: Mar 28, 1961) AA female whose primary care physician is ROSS,ALLAN, MD and comes for left breast lumpectomy and left axillary SLNBx.   The indications and risks of the surgery were explained to the patient.  The risks include, but are not limited to, infection, bleeding, and nerve injury.  Note dictated to:   #[id number]

## 2012-07-17 NOTE — Anesthesia Procedure Notes (Signed)
Procedure Name: LMA Insertion Date/Time: 07/17/2012 12:29 PM Performed by: Zenia Resides D Pre-anesthesia Checklist: Patient identified, Emergency Drugs available, Suction available, Patient being monitored and Timeout performed Patient Re-evaluated:Patient Re-evaluated prior to inductionOxygen Delivery Method: Circle System Utilized Preoxygenation: Pre-oxygenation with 100% oxygen Intubation Type: IV induction Ventilation: Mask ventilation without difficulty LMA: LMA inserted LMA Size: 4.0 Number of attempts: 1 Airway Equipment and Method: bite block Placement Confirmation: positive ETCO2 and breath sounds checked- equal and bilateral Tube secured with: Tape Dental Injury: Teeth and Oropharynx as per pre-operative assessment

## 2012-07-18 NOTE — Op Note (Signed)
NAMECORTLYN, Joann Page                 ACCOUNT NO.:  [id number]  MEDICAL RECORD NO.:  [id number]  LOCATION:                                 FACILITY:  PHYSICIAN:  Sandria Bales. Ezzard Standing, M.D.  DATE OF BIRTH:  1960/12/23  DATE OF PROCEDURE:  07/17/2012                              OPERATIVE REPORT   PREOPERATIVE DIAGNOSIS:  Left breast cancer at 1 o'clock position.  POSTOPERATIVE DIAGNOSIS:  Left breast cancer, 1 o'clock position (T1, N0 cancer).  PROCEDURE:  Injection of methylene blue (approximately 0.5 mL), left breast needle localized lumpectomy, left axillary sentinel lymph node biopsy.  SURGEON:  Sandria Bales. Ezzard Standing, MD  FIRST ASSISTANT:  None.  ANESTHESIA:  General, supervised by Zenon Mayo, MD  ESTIMATED BLOOD LOSS:  Less than 50 mL.  DRAINS:  Left in were none.  I used 30 mL of local anesthetic.  INDICATION FOR PROCEDURE:  Joann Page is a 51 year old African American female, whose primary physician is Dr. Duane Lope, who comes for left breast lumpectomy and left axillary sentinel lymph node biopsy for left breast cancer.  She had a biopsy of a left breast cancer which is ER 100%, PR 31%, HER2 negative, and Ki-67 is 40%.  She now comes for a left breast lumpectomy and left axillary sentinel lymph node biopsy.  The implications, potential complications of surgery were explained to the patient. Potential complications include, but are not limited to, bleeding, infection, the need for further surgery, nerve injury.  OPERATIVE NOTE:  The patient presented to the [facility].  [facility] and Dr. Beckie Salts had a placed a wire in the left breast localizing the breast cancer.  In the holding area, her left breast was injected with 1 millicuries of technetium sulfa colloid.  She then presented to OR room 8 where she underwent a general endotracheal anesthetic supervised by W. Autumn Patty, MD and she was given 2 g of Ancef.  A time-out  was held and surgical checklist run.  I injected her left breast with about a 0.5 mL of 40% methylene blue.  I then prepped her left breast and axilla with ChloraPrep and sterilely draped her. I first went for the left axillary lymph node.  She had counts over 2000 around the nipple area, and I found counts at the edge of her breast and pectoralis major muscle of counts about 300-400.  I cut down into the axilla, identified a block of axillary fat that had counts up to 1500, excised this area. There was some blue staining of at least one lymph nodes, some nodes were hot, some were warm, the hottest node be about 1500, the background was 50.  The sentinel lymph node biopsy was sent for permanent pathology.  I then turned my attention to the left breast, she had a wire coming out her breast close to the 12 o'clock position, but the wire was pointed towards the axilla to about the 1-2 o'clock position.  I excised a block of breast tissue approximately 6 cm in diameter.  I went down to the chest wall.  I could feel this tumor mass once I  got halfway through my dissection again towards the chest wall.  I excised the mass, I did try to include some skin anteriorly.  I painted the lumpectomy with 6 color paint kit and sent to Pathology for analysis.  I then irrigated the wound with saline.  I placed baby hemoclips on the 12, 3, 6, and 9 o'clock position, and 1 clip on the chest wall.  There would be no further excision posteriorly if this has close margin.  The specimen mammogram shows the tumor was in the inferior-medial portion of the biopsy specimen. I did excise an additional margin along the inferior medial border.  I painted the specimen and placed a suture anteriorly.  I used both electrocautery and 3-0 Vicryl sutures for blood control.  Both wounds were then closed in layers using 3-0 Vicryl sutures, a 5-0 Monocryl for the skin, and both wounds were painted with Dermabond.  The patient  tolerated procedure well.  I did place 4x4s and chest binder on her.  Sponge and needle count were correct at the end of the case.  She was transferred to the recovery room in good condition.   Sandria Bales. Ezzard Standing, M.D., FACS   DHN/MEDQ  D:  07/17/2012  T:  07/17/2012  Job:  [id number]  cc:   Magnus Sinning) Tenny Craw, M.D.

## 2012-07-28 ENCOUNTER — Encounter (INDEPENDENT_AMBULATORY_CARE_PROVIDER_SITE_OTHER): Payer: Self-pay | Admitting: Surgery

## 2012-07-28 ENCOUNTER — Ambulatory Visit (INDEPENDENT_AMBULATORY_CARE_PROVIDER_SITE_OTHER): Payer: Private Health Insurance - Indemnity | Admitting: Surgery

## 2012-07-28 ENCOUNTER — Telehealth: Payer: Self-pay | Admitting: *Deleted

## 2012-07-28 VITALS — BP 138/82 | HR 64 | Temp 96.1°F | Resp 18 | Ht 66.0 in | Wt 208.0 lb

## 2012-07-28 DIAGNOSIS — C50919 Malignant neoplasm of unspecified site of unspecified female breast: Secondary | ICD-10-CM

## 2012-07-28 NOTE — Progress Notes (Addendum)
Re:   Joann Page DOB:   26-Sep-1961 MRN:   [id number]  ASSESSMENT AND PLAN: 1.  Left breast cancer, 1 o'clock.  T1c, N0.  Final path: 1.7 cm IDC, 0/4 nodes, margins clear  2.3 cm by MRI, 1.2 x 1.1 cm on [place].  ER - 100%, PR - 31%, Her2Neu - neg, Ki-67 - 40% (presented at Breast Ca Conf - 07/12/2012)  To make appt for medical and radiation oncology.  [Saw Drs. Rubin and Squire.]  I will see her back in 6 months. [Has Oncotype DX pending to decide on further treatment.  Represented at the Breast Ca Conf - 08/09/2012] [Oncotype DX - 29, 19% recurrence rate.  DN 08/18/2012] [Neg BRCA plus.  DN 09/19/2012]   2.  Morbid obesity.  She decided not to go through with a lap band.  I saw her in December, 2012.  She has lost 6 pounds since I saw her.  She says this has probably been in the last 2 weeks, because of what is going on with her breast. 3.  Arthritis of the knees, see Dr. Otis Brace  4.  Hyperlipidemia.  Chief Complaint  Patient presents with  . Pre-op Exam    Lft breast   REFERRING PHYSICIAN:  Dr. Lucita Ferrara  HISTORY OF PRESENT ILLNESS: Joann Page is a 51 y.o. (DOB: 1961/01/10)   female whose primary care physician is [facility], MD and comes to follow up of a left breast lumpectomy and axillary SLNBx.  She is doing well, except she is sore.  We reviewed and discussed the pathology.  Breast History: She had an abnormality identified on mammogram and underwent 2 left core biopsies.  The first, 06/30/2012, was negative.  The second, 07/03/2012, showed invasive ductal ca. She has no prior history of breast abnormalities.  She had a hysterectomy in 05-04-2000 for fibroids.  Still has her ovaries and is no hormone meds.  Her sister, Joann Page, had breast cancer in her late 05/05/2023 and died of breast cancer.  This is the only family member with breast cancer.   Past Medical History  Diagnosis Date  . 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 Knee pain, right   . Degenerative disk disease  05-04-09    Lumbar spine  . Obesity   . Cancer 07/07/12    Left Breast Inv Ductal Ca.  . No pertinent past medical history   . GERD (gastroesophageal reflux disease)     hx  . H/O hiatal hernia     small    Current Outpatient Prescriptions  Medication Sig Dispense Refill  . Cholecalciferol (VITAMIN D) 2000 UNITS CAPS Take 1 capsule by mouth daily.        . diazepam (VALIUM) 10 MG tablet Take 10 mg by mouth every 6 (six) hours as needed.      Marland Kitchen HYDROcodone-acetaminophen (NORCO/VICODIN) 5-325 MG per tablet Take 1 tablet by mouth every 6 (six) hours as needed. pain      . ibuprofen (ADVIL,MOTRIN) 200 MG tablet Take 400 mg by mouth every 6 (six) hours as needed. pain      . ranitidine (ZANTAC) 150 MG tablet Take 150 mg by mouth 2 (two) times daily.        Allergies  Allergen Reactions  . Sulfa Antibiotics Shortness Of Breath and Swelling    REVIEW OF SYSTEMS: Musculoskeletal:  Sees Dr. Otis Brace for knee pain/arthritis.  Has never had surgery.  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 She is marries and has one daughter 64 YO. She does not work.  PHYSICAL EXAM: BP 138/82  Pulse 64  Temp 96.1 F (35.6 C) (Oral)  Resp 18  Ht 5\' 6"  (1.676 m)  Wt 208 lb (94.348 kg)  BMI 33.57 kg/m2  Breasts:  Left: Incisions look okay.  DATA REVIEWED: Path to patient.  Ovidio Kin, MD,  [facility] [address] [place]  [address]   [place], [place]    [address] Phone:  [phone] FAX:  [phone]

## 2012-07-28 NOTE — Telephone Encounter (Signed)
Confirmed 08/02/12 appt w/ pt.  Emailed Clydie Braun for a [employer].  Emailed Huntley Dec at [facility] to make her aware.  Mailed before appt letter & packet to pt.  Took paperwork to Med Rec for chart.

## 2012-08-01 ENCOUNTER — Encounter: Payer: Self-pay | Admitting: Oncology

## 2012-08-01 NOTE — Progress Notes (Signed)
Received voice message from Ms. Lanese. Returned call , no answer. Left message to return my  call.

## 2012-08-02 ENCOUNTER — Ambulatory Visit: Payer: Private Health Insurance - Indemnity

## 2012-08-02 ENCOUNTER — Other Ambulatory Visit: Payer: Self-pay | Admitting: Oncology

## 2012-08-02 ENCOUNTER — Other Ambulatory Visit (HOSPITAL_BASED_OUTPATIENT_CLINIC_OR_DEPARTMENT_OTHER): Payer: Private Health Insurance - Indemnity | Admitting: Lab

## 2012-08-02 ENCOUNTER — Ambulatory Visit (HOSPITAL_BASED_OUTPATIENT_CLINIC_OR_DEPARTMENT_OTHER): Payer: Managed Care, Other (non HMO) | Admitting: Oncology

## 2012-08-02 ENCOUNTER — Telehealth: Payer: Self-pay | Admitting: *Deleted

## 2012-08-02 VITALS — BP 133/83 | HR 80 | Temp 98.1°F | Resp 20 | Ht 66.0 in | Wt 210.7 lb

## 2012-08-02 DIAGNOSIS — C50912 Malignant neoplasm of unspecified site of left female breast: Secondary | ICD-10-CM

## 2012-08-02 DIAGNOSIS — C50919 Malignant neoplasm of unspecified site of unspecified female breast: Secondary | ICD-10-CM

## 2012-08-02 DIAGNOSIS — Z170 Estrogen receptor positive status [ER+]: Secondary | ICD-10-CM

## 2012-08-02 LAB — CBC WITH DIFFERENTIAL/PLATELET
BASO%: 0.4 % (ref 0.0–2.0)
Eosinophils Absolute: 0.1 10*3/uL (ref 0.0–0.5)
MCHC: 34.3 g/dL (ref 31.5–36.0)
MCV: 84.6 fL (ref 79.5–101.0)
MONO%: 7.6 % (ref 0.0–14.0)
NEUT#: 3 10*3/uL (ref 1.5–6.5)
RBC: 4.55 10*6/uL (ref 3.70–5.45)
RDW: 12.6 % (ref 11.2–14.5)
WBC: 5.2 10*3/uL (ref 3.9–10.3)

## 2012-08-02 LAB — COMPREHENSIVE METABOLIC PANEL (CC13)
ALT: 23 U/L (ref 0–55)
AST: 17 U/L (ref 5–34)
Alkaline Phosphatase: 58 U/L (ref 40–150)
CO2: 20 mEq/L — ABNORMAL LOW (ref 22–29)
Creatinine: 0.8 mg/dL (ref 0.6–1.1)
Sodium: 136 mEq/L (ref 136–145)
Total Bilirubin: 0.3 mg/dL (ref 0.20–1.20)
Total Protein: 6.5 g/dL (ref 6.4–8.3)

## 2012-08-02 NOTE — Telephone Encounter (Signed)
Gave patient appointment for 08-16-2012 at 9:00am

## 2012-08-02 NOTE — Progress Notes (Signed)
Joann Page [id number] 21-Oct-1961 51 y.o. 08/02/2012 3:39 PM  CC  Miguel Aschoff, MD [address] [place] [address]  REASON FOR CONSULTATION:  Breast cancer   STAGE: I  No matching staging information was found for the patient.  REFERRING PHYSICIAN: Dr. Ovidio Kin  HISTORY OF PRESENT ILLNESS:  Joann Page is a 51 y.o. female.  In previous good health who presents with abnormal mammogram. Her breast. She has undergone annual screening mammography. A screening mammogram and subsequent left mammogram and ultrasound performed on 18 2013 showed a 2.2 cm mass in the left breast. Some thickening was noted. A biopsy was recommended which took place on 06/30/12 which did not reveal any abnormalities. 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 Your both breasts was performed on 07/07/2012 this showed a 2.3 x 1.0 x 1.6 cm mass in the left breast. No other abnormalities were seen. She ultimately underwent a lumpectomy and sentinel lymph node evaluation on 07/17/2012. She underwent lumpectomy and sentinel lymph node evaluation on 07/17/2012. This showed a grade 3 1.7 cm mass. 4 sentinel lymph nodes were negative for malignancy. HER-2 was rechecked and was now equivocal with a ratio of 1.96. Post postoperative course was unremarkable.   Past Medical History:  Past Medical History  Diagnosis Date  . 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 Knee pain, right   . Degenerative disk disease 2010    Lumbar spine  . Obesity   . Cancer 07/07/12    Left Breast Inv Ductal Ca.  . No pertinent past medical history   . GERD (gastroesophageal reflux disease)     hx  . H/O hiatal hernia     small    Past Surgical History:  Past Surgical History  Procedure Date  . Abdominal hysterectomy (ovaries in) 04/2000  . Breath tek h pylori 12/07/2011    Procedure: BREATH TEK H PYLORI;   Surgeon: Kandis Cocking, MD;  Location: [facility];  Service: Endoscopy;  Laterality: N/A;    Family History:  Family History  Problem Relation Age of Onset  . Colon cancer Father Died at 66  . Cancer Father     colon  . Colon polyps Brother   . Cancer Sister 1    breast  . Obesity Sister   Lymphoma in one brother  Social History  History  Substance Use Topics  . Smoking status: Never Smoker   . Smokeless tobacco: Never Used  . Alcohol Use: No   she's been married for approximately 20 years, currently separated one child age 68. Husband works as a Engineer, agricultural for [employer]  Allergies:  Allergies  Allergen Reactions  . Sulfa Antibiotics Shortness Of Breath and Swelling    Current Medications:  Current Outpatient Prescriptions  Medication Sig Dispense Refill  . Cholecalciferol (VITAMIN D) 2000 UNITS CAPS Take 1 capsule by mouth daily.        . diazepam (VALIUM) 10 MG tablet Take 10 mg by mouth every 6 (six) hours as needed.      Marland Kitchen HYDROcodone-acetaminophen (NORCO/VICODIN) 5-325 MG per tablet Take 1 tablet by mouth every 6 (six) hours as needed. pain      . ibuprofen (ADVIL,MOTRIN) 200 MG tablet Take 400 mg by mouth every 6 (six) hours as needed. pain      . ranitidine (ZANTAC) 150 MG tablet Take 150 mg by  mouth 2 (two) times daily.        OB/GYN History:  G1P1 Menarche @13  No HRT  Fertility Discussion:no Prior History of Cancer:no  Health Maintenance:  Colonoscopy yes Bone Density no Last PAP smear 2013  ECOG PERFORMANCE STATUS: 0 - Asymptomatic  Genetic Counseling/testing: yes  REVIEW OF SYSTEMS:  A comprehensive review of systems was negative.  PHYSICAL EXAMINATION: Blood pressure 133/83, pulse 80, temperature 98.1 F (36.7 C), resp. rate 20, height 5\' 6"  (1.676 m), weight 210 lb 11.2 oz (95.573 kg).  HEENT:  Sclerae anicteric, conjunctivae pink.  Oropharynx clear.  No mucositis or candidiasis.  Nodes:  No cervical, supraclavicular, or axillary  lymphadenopathy palpated.  Breast Exam:  Right breast is benign.  No masses, discharge, skin change, or nipple inversion.  Left breast is benign.  No masses, discharge, skin change, or nipple inversion..  Lungs:  Clear to auscultation bilaterally.  No crackles, rhonchi, or wheezes.  Heart:  Regular rate and rhythm.  Abdomen:  Soft, nontender.  Positive bowel sounds.  No organomegaly or masses palpated.  Musculoskeletal:  No focal spinal tenderness to palpation.  Extremities:  Benign.  No peripheral edema or cyanosis.  Skin:  Benign.  Neuro:  Nonfocal.      STUDIES/RESULTS: Mr Breast Bilateral W Wo Contrast  07/07/2012  *RADIOLOGY REPORT*  Clinical Data: Recently diagnosed left breast invasive ductal carcinoma.  BILATERAL BREAST MRI WITH AND WITHOUT CONTRAST  Technique: Multiplanar, multisequence MR images of both breasts were obtained prior to and following the intravenous administration of 15ml of MultiHance.  Three dimensional images were evaluated at the independent DynaCad workstation.  Comparison:  Recent mammogram, ultrasound and biopsy examinations.  Findings: Mild background parenchymal enhancement both breasts. 2.3 x 1.8 x 1.6 cm rounded, enhancing mass with irregular margins in the 12 o'clock position of the left breast in the middle third. This has a mixture of enhancement kinetics, including rapid washin/washout.  There is a biopsy marker clip artifact at the anterior aspect of the mass.  There is a second biopsy marker clip artifact more superiorly and anteriorly, at the location of a recent benign left breast ultrasound guided core needle biopsy.  No additional masses or areas of enhancement suspicious for malignancy in either breast.  No abnormal appearing lymph nodes. Two small hemangiomas in the sternum.  No evidence of metastatic disease.  IMPRESSION: 2.3 cm biopsy-proven invasive ductal carcinoma in the 12 o'clock position of the left breast.  Otherwise, unremarkable examination.   RECOMMENDATION: Treatment plan.  THREE-DIMENSIONAL MR IMAGE RENDERING ON INDEPENDENT WORKSTATION:  Three-dimensional MR images were rendered by post-processing of the original MR data on an independent workstation.  The three- dimensional MR images were interpreted, and findings were reported in the accompanying complete MRI report for this study.  BI-RADS CATEGORY 6:  Known biopsy-proven malignancy - appropriate action should be taken.  Original Report Authenticated By: Darrol Angel, M.D.   Nm Sentinel Node Inj-no Rpt (breast)  07/17/2012  CLINICAL DATA: left breast cancer   Sulfur colloid was injected intradermally by the nuclear medicine  technologist for breast cancer sentinel node localization.     Mm Breast Stereo Biopsy Left  07/06/2012  **ADDENDUM** CREATED: 07/06/2012 08:04:16  Biopsy results reveal invasive ductal carcinoma, concordant with the imaging findings. This is discussed with the patient by telephone on 07/04/12.  A surgical consultation is made with Dr. Ezzard Standing on 07/14/12 at 9:30 am.  A breast MRI is recommended and scheduled for 07/07/12 at 9:30 am.  The patient states the site is healing well with mild tenderness but no bruising.  Addended by:  Hiram Gash, M.D. on 07/06/2012 08:04:16.  **END ADDENDUM** SIGNED BY: Hiram Gash, M.D.   07/03/2012  *RADIOLOGY REPORT*  Clinical Data:  Left breast mass  STEREOTACTIC-GUIDED VACUUM ASSISTED BIOPSY OF THE LEFT BREAST  Comparison: Previous exams  I met with the patient and we discussed the procedure of stereotactic-guided biopsy, including benefits and alternatives. We discussed the high likelihood of a successful procedure. We discussed the risks of the procedure, including infection, bleeding, tissue injury, clip migration, and inadequate sampling. Informed, written consent was given.  Using sterile technique, 2% lidocaine, stereotactic guidance, and a 9 gauge vacuum assisted device, biopsy was performed of the left breast mass. At the  conclusion of the procedure, a plug type tissue marker clip was deployed into the biopsy cavity.  Follow-up 2-view mammogram confirmed clip in association with a mass in the upper central aspect of the left breast at the junction of the posterior and middle third.  IMPRESSION: Stereotactic-guided biopsy of a left breast mass.  No apparent complications.  Original Report Authenticated By: Hiram Gash, M.D.   Mm Breast Surgical Specimen  07/17/2012  *RADIOLOGY REPORT*  Clinical Data:  Recently diagnosed left breast invasive ductal carcinoma.  NEEDLE LOCALIZATION WITH MAMMOGRAPHIC GUIDANCE AND SPECIMEN RADIOGRAPH  The patient presents for needle localization prior to left lumpectomy.  I met with the patient and we discussed the procedure of needle localization including risks.  Specifically, we discussed the risks of bleeding and infection.  Informed written consent was given.  Using mammographic guidance, sterile technique, local anesthesia and a 9 cm localization needle, the recently placed hourglass biopsy marker clip was localized using a superior approach.  The images were marked for Dr. Ezzard Standing.  Specimen radiograph was performed at [facility], and demonstrates the hourglass shaped biopsy marker clip, previously placed ribbon shaped clip at the site of a benign biopsy, wire tip and small mass with poorly defined margins present in the tissue sample.  The specimen was marked for pathology.  IMPRESSION: Needle localization left breast.  No apparent complications.   Original Report Authenticated By: Darrol Angel, M.D.    Mm Breast Wire Localization Left  07/17/2012  *RADIOLOGY REPORT*  Clinical Data:  Recently diagnosed left breast invasive ductal carcinoma.  NEEDLE LOCALIZATION WITH MAMMOGRAPHIC GUIDANCE AND SPECIMEN RADIOGRAPH  The patient presents for needle localization prior to left lumpectomy.  I met with the patient and we discussed the procedure of needle localization including risks.   Specifically, we discussed the risks of bleeding and infection.  Informed written consent was given.  Using mammographic guidance, sterile technique, local anesthesia and a 9 cm localization needle, the recently placed hourglass biopsy marker clip was localized using a superior approach.  The images were marked for Dr. Ezzard Standing.  Specimen radiograph was performed at [facility], and demonstrates the hourglass shaped biopsy marker clip, previously placed ribbon shaped clip at the site of a benign biopsy, wire tip and small mass with poorly defined margins present in the tissue sample.  The specimen was marked for pathology.  IMPRESSION: Needle localization left breast.  No apparent complications.   Original Report Authenticated By: Darrol Angel, M.D.      LABS:    Chemistry      Component Value Date/Time   NA 136 08/02/2012 1318   NA 140 11/24/2011 1625   K 4.2 08/02/2012 1318   K  3.8 11/24/2011 1625   CL 107 08/02/2012 1318   CL 106 11/24/2011 1625   CO2 20* 08/02/2012 1318   CO2 26 11/24/2011 1625   BUN 8.0 08/02/2012 1318   BUN 9 11/24/2011 1625   CREATININE 0.8 08/02/2012 1318   CREATININE 0.79 11/24/2011 1625   CREATININE 0.72 05/02/2011 2018      Component Value Date/Time   CALCIUM 9.0 11/24/2011 1625   ALKPHOS 58 08/02/2012 1318   ALKPHOS 43 11/24/2011 1625   AST 17 08/02/2012 1318   AST 16 11/24/2011 1625   ALT 23 08/02/2012 1318   ALT 19 11/24/2011 1625   BILITOT 0.30 08/02/2012 1318   BILITOT 0.2* 11/24/2011 1625      Lab Results  Component Value Date   WBC 5.2 08/02/2012   HGB 13.2 08/02/2012   HCT 38.5 08/02/2012   MCV 84.6 08/02/2012   PLT 224 08/02/2012       PATHOLOGY: as above  ASSESSMENT    51 year old with history of ER/PR positive, HER-2 borderline of node-negative tumor.  Clinical Trial Eligibility:no  Multidisciplinary conference discussion yes    PLAN:    We are going to discussion today regarding with adjuvant treatment in this setting. I outlined the benefits of  taking adjuvant hormonal therapy and also discussed the theoretical benefits of chemotherapy in this setting. She is a good candidate for an Oncotype test and I explained the rationale basis for this. We will submit a tissue LC again in followup in 2 weeks. She sees radiation oncologist and next week as well. We will likely need the results of the Oncotype test before we make a decision about chemotherapy. I plan on doing a separate testing for HER-2 positivity as well. I outlined that if she does require chemotherapy that her likely regimen would include Taxotere and Cytoxan. I briefly outlined some of the side effects of this as well.       Discussion: Patient is being treated per NCCN breast cancer care guidelines appropriate for stage.I   Thank you so much for allowing me to participate in the care of Joann Page. I will continue to follow up the patient with you and assist in her care.  All questions were answered. The patient knows to call the clinic with any problems, questions or concerns. We can certainly see the patient much sooner if necessary.  I spent 25 minutes counseling the patient face to face. The total time spent in the appointment was 55 minutes.  Mervin Hack MD 08/02/2012, 3:39 PM

## 2012-08-03 ENCOUNTER — Encounter: Payer: Self-pay | Admitting: *Deleted

## 2012-08-03 ENCOUNTER — Encounter: Payer: Self-pay | Admitting: Radiation Oncology

## 2012-08-03 LAB — CANCER ANTIGEN 27.29: CA 27.29: 23 U/mL (ref 0–39)

## 2012-08-03 NOTE — Progress Notes (Signed)
Ordered Oncotype Dx test w/ [facility].  Faxed request to [facility].  Faxed PAC to [facility]. 

## 2012-08-04 ENCOUNTER — Ambulatory Visit
Admission: RE | Admit: 2012-08-04 | Discharge: 2012-08-04 | Disposition: A | Discharge status: Home / Self Care | Payer: Managed Care, Other (non HMO) | Source: Ambulatory Visit | Attending: Radiation Oncology | Admitting: Radiation Oncology

## 2012-08-04 ENCOUNTER — Encounter: Payer: Self-pay | Admitting: Radiation Oncology

## 2012-08-04 VITALS — BP 127/82 | HR 108 | Temp 98.2°F | Resp 20 | Wt 210.4 lb

## 2012-08-04 DIAGNOSIS — C50912 Malignant neoplasm of unspecified site of left female breast: Secondary | ICD-10-CM

## 2012-08-04 DIAGNOSIS — C50419 Malignant neoplasm of upper-outer quadrant of unspecified female breast: Secondary | ICD-10-CM | POA: Insufficient documentation

## 2012-08-04 HISTORY — DX: Hyperlipidemia, unspecified: E78.5

## 2012-08-04 NOTE — Progress Notes (Signed)
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 female who was found on screening mammography to have an abnormality in her left breast. This was in the 12:00 position. Ultrasound measured the lesion to be 1.2 cm greatest dimension. Initial biopsy was nondiagnostic. A stereotactic biopsy on 07/03/2012 showed invasive ductal carcinoma.   MRI of her breasts was performed on a 913. This demonstrated a 2.3 x 1.8 x 1.6 cm enhancing mass with irregular margins at the 12:00 position of the left breast. There are no additional masses or areas of enhancement that were suspicious in either breast. There were no abnormal appearing lymph nodes.  She went on to undergo a lumpectomy and sentinel lymph node biopsy on 07/17/2012. All 4 sentinel lymph nodes were negative. The tumor measured 1.7 cm. The margins are clear. There is no LVSI. 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  The patient is otherwise in her usual state of health. She is a genetics appointment pending as her sister was diagnosed with breast cancer in her 61s. The patient is also waiting for Oncotype DX results  PREVIOUS RADIATION THERAPY: No  PAST MEDICAL HISTORY:  has a past medical history of Arthritis (2010); Knee pain, right; Degenerative disk disease (2010); Obesity; Cancer (07/07/12); No pertinent past medical history; GERD (gastroesophageal reflux disease); H/O hiatal hernia; and Hyperlipidemia.    PAST SURGICAL HISTORY: Past Surgical History  Procedure Date  . Abdominal hysterectomy 04/2000  . Breath tek h pylori  12/07/2011    Procedure: BREATH TEK H PYLORI;  Surgeon: Kandis Cocking, MD;  Location: [facility];  Service: Endoscopy;  Laterality: N/A;  . Needle core biopsy 06/30/12    Left Breast: 12 O'clock/ Benign Parenchyma  . 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    FAMILY HISTORY: family history includes Breast cancer in her sister; Colon cancer in her father; Colon polyps in her brother; Lymphoma in her brother; and Obesity in her sister. Her sister was diagnosed with breast cancer in her 75s. Patient denies any other immediate family members with breast cancer and denies any known family history of ovarian cancer.  SOCIAL HISTORY:  reports that she has never smoked. She has never used smokeless tobacco. She reports that she does not drink alcohol or use illicit drugs.  ALLERGIES: Sulfa antibiotics  MEDICATIONS:  Current Outpatient Prescriptions  Medication Sig Dispense Refill  . Cholecalciferol (VITAMIN D) 2000 UNITS CAPS Take 1 capsule by mouth daily.        . diazepam (VALIUM) 10 MG tablet Take 10 mg by mouth every 6 (six) hours as needed.      Marland Kitchen HYDROcodone-acetaminophen (NORCO/VICODIN) 5-325 MG per tablet Take 1 tablet by mouth every 6 (six) hours as needed. pain      . ibuprofen (ADVIL,MOTRIN) 200 MG tablet Take 400 mg by mouth every 6 (six) hours as needed. pain      .  ranitidine (ZANTAC) 150 MG tablet Take 150 mg by mouth daily as needed.         REVIEW OF SYSTEMS:   A comprehensive review of systems was negative.- other than tenderness and swelling in her left breast since surgery   PHYSICAL EXAM:  weight is 210 lb 6.4 oz (95.437 kg). Her oral temperature is 98.2 F (36.8 C). Her blood pressure is 127/82 and her pulse is 108. Her respiration is 20.     General: Alert and oriented, in no acute distress HEENT: Head is normocephalic. Pupils are equally round and reactive to light. Extraocular movements are intact. Oropharynx is clear. Neck: Neck is supple, no palpable cervical or supraclavicular lymphadenopathy. Heart: Regular in rate and rhythm with no murmurs, rubs, or gallops. Chest: Clear to auscultation bilaterally, with no rhonchi, wheezes, or rales. Abdomen: Soft, nontender, nondistended, with no rigidity or guarding. Extremities: No cyanosis or edema. Lymphatics: No concerning lymphadenopathy. Skin: No concerning lesions. Musculoskeletal: symmetric strength and muscle tone throughout. Neurologic: Cranial nerves II through XII are grossly intact. No obvious focalities. Speech is fluent. Coordination is intact. Psychiatric: Judgment and insight are intact. Affect is appropriate. Breasts: No palpable lesions of concern in either breast nor any palpable axillary adenopathy. Her left breast is swollen and tender with ecchymoses consistent with past surgery. Her breasts are large.    LABORATORY DATA:  Lab Results  Component Value Date   WBC 5.2 08/02/2012   HGB 13.2 08/02/2012   HCT 38.5 08/02/2012   MCV 84.6 08/02/2012   PLT 224 08/02/2012   Lab Results  Component Value Date   NA 136 08/02/2012   K 4.2 08/02/2012   CL 107 08/02/2012   CO2 20* 08/02/2012   Lab Results  Component Value Date   ALT 23 08/02/2012   AST 17 08/02/2012   ALKPHOS 58 08/02/2012   BILITOT 0.30 08/02/2012      RADIOGRAPHY: Mr Breast Bilateral W Wo Contrast  07/07/2012  *RADIOLOGY REPORT*  Clinical Data: Recently diagnosed left breast invasive ductal carcinoma.  BILATERAL BREAST MRI WITH AND WITHOUT CONTRAST  Technique: Multiplanar, multisequence MR images of both breasts were obtained prior to and following the intravenous administration of 15ml of MultiHance.  Three dimensional images were evaluated at the independent DynaCad workstation.  Comparison:  Recent mammogram,  ultrasound and biopsy examinations.  Findings: Mild background parenchymal enhancement both breasts. 2.3 x 1.8 x 1.6 cm rounded, enhancing mass with irregular margins in the 12 o'clock position of the left breast in the middle third. This has a mixture of enhancement kinetics, including rapid washin/washout.  There is a biopsy marker clip artifact at the anterior aspect of the mass.  There is a second biopsy marker clip artifact more superiorly and anteriorly, at the location of a recent benign left breast ultrasound guided core needle biopsy.  No additional masses or areas of enhancement suspicious for malignancy in either breast.  No abnormal appearing lymph nodes. Two small hemangiomas in the sternum.  No evidence of metastatic disease.  IMPRESSION: 2.3 cm biopsy-proven invasive ductal carcinoma in the 12 o'clock position of the left breast.  Otherwise, unremarkable examination.  RECOMMENDATION: Treatment plan.  THREE-DIMENSIONAL MR IMAGE RENDERING ON INDEPENDENT WORKSTATION:  Three-dimensional MR images were rendered by post-processing of the original MR data on an independent workstation.  The three- dimensional MR images were interpreted, and findings were reported in the accompanying complete MRI report for this study.  BI-RADS CATEGORY 6:  Known biopsy-proven malignancy - appropriate action  should be taken.  Original Report Authenticated By: Darrol Angel, M.D.   Nm Sentinel Node Inj-no Rpt (breast)  07/17/2012  CLINICAL DATA: left breast cancer   Sulfur colloid was injected intradermally by the nuclear medicine  technologist for breast cancer sentinel node localization.     Mm Breast Surgical Specimen  07/17/2012  *RADIOLOGY REPORT*  Clinical Data:  Recently diagnosed left breast invasive ductal carcinoma.  NEEDLE LOCALIZATION WITH MAMMOGRAPHIC GUIDANCE AND SPECIMEN RADIOGRAPH  The patient presents for needle localization prior to left lumpectomy.  I met with the patient and we discussed the  procedure of needle localization including risks.  Specifically, we discussed the risks of bleeding and infection.  Informed written consent was given.  Using mammographic guidance, sterile technique, local anesthesia and a 9 cm localization needle, the recently placed hourglass biopsy marker clip was localized using a superior approach.  The images were marked for Dr. Ezzard Standing.  Specimen radiograph was performed at [facility], and demonstrates the hourglass shaped biopsy marker clip, previously placed ribbon shaped clip at the site of a benign biopsy, wire tip and small mass with poorly defined margins present in the tissue sample.  The specimen was marked for pathology.  IMPRESSION: Needle localization left breast.  No apparent complications.   Original Report Authenticated By: Darrol Angel, M.D.    Mm Breast Wire Localization Left  07/17/2012  *RADIOLOGY REPORT*  Clinical Data:  Recently diagnosed left breast invasive ductal carcinoma.  NEEDLE LOCALIZATION WITH MAMMOGRAPHIC GUIDANCE AND SPECIMEN RADIOGRAPH  The patient presents for needle localization prior to left lumpectomy.  I met with the patient and we discussed the procedure of needle localization including risks.  Specifically, we discussed the risks of bleeding and infection.  Informed written consent was given.  Using mammographic guidance, sterile technique, local anesthesia and a 9 cm localization needle, the recently placed hourglass biopsy marker clip was localized using a superior approach.  The images were marked for Dr. Ezzard Standing.  Specimen radiograph was performed at [facility], and demonstrates the hourglass shaped biopsy marker clip, previously placed ribbon shaped clip at the site of a benign biopsy, wire tip and small mass with poorly defined margins present in the tissue sample.  The specimen was marked for pathology.  IMPRESSION: Needle localization left breast.  No apparent complications.   Original Report Authenticated By: Darrol Angel, M.D.       IMPRESSION/PLAN:   Is a very pleasant 51 year old woman with T1 CN 0 M0 ER/PR positive HER-2/neu equivocal breast cancer. It is grade 3.  We will wait for the results for her Oncotype DX testing. I think it is fairly likely that she will need chemotherapy. If she does not need chemotherapy I would appreciate for the patient be referred back to me for radiotherapy planning.  It was a pleasure meeting the patient today. We discussed the risks, benefits, and side effects of radiotherapy. She understands that radiation is a targeted treatment that would decrease her risk of an in breast recurrence by about two thirds. We also discussed the small benefit to her overall life expectancy from radiotherapy. We discussed that radiation would take approximately 6 weeks to complete and that I would give the patient a few weeks to heal following surgery before starting treatment planning. We spoke about acute effects including skin irritation and fatigue as well as much less common late effects including lung and heart irritation. We spoke about the latest technology that is used to minimize the  risk of late effects for breast cancer patients undergoing radiotherapy. No guarantees of treatment were given. The patient is enthusiastic about proceeding with treatment. I look forward to participating in the patient's care. At our next meeting, I will go over the consent form in detail with her to be signed.  __________________________________________   Lonie Peak, MD

## 2012-08-04 NOTE — Progress Notes (Signed)
Please see the Nurse Progress Note in the MD Initial Consult Encounter for this patient. 

## 2012-08-04 NOTE — Progress Notes (Signed)
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 Left breast incisions healed, occasional pain throughout  Left breast, some swelling noted under axilla Eating and drinking well   Allergies:NKDA

## 2012-08-07 ENCOUNTER — Telehealth (INDEPENDENT_AMBULATORY_CARE_PROVIDER_SITE_OTHER): Payer: Self-pay

## 2012-08-07 DIAGNOSIS — G8918 Other acute postprocedural pain: Secondary | ICD-10-CM

## 2012-08-07 MED ORDER — HYDROCODONE-ACETAMINOPHEN 5-325 MG PO TABS: 1.0000 | ORAL_TABLET | ORAL | Status: DC | PRN

## 2012-08-07 NOTE — Telephone Encounter (Signed)
I called in Hydrocodone 5/325 one tab po Q 4-6hrs prn pain #30 no refills to [facility] [phone]

## 2012-08-07 NOTE — Telephone Encounter (Signed)
Message copied by Ivory Broad on Mon Aug 07, 2012 10:08 AM ------      Message from: [name]      Created: Mon Aug 07, 2012  9:40 AM      Regarding: Lemmie Evens: [phone]       Patient would like a refill on her prescription in the generic brand. Please send to [facility] on [address]. Thank you.

## 2012-08-14 ENCOUNTER — Ambulatory Visit: Payer: Private Health Insurance - Indemnity | Admitting: Oncology

## 2012-08-15 ENCOUNTER — Encounter: Payer: Self-pay | Admitting: *Deleted

## 2012-08-15 NOTE — Progress Notes (Signed)
Received Oncotype Dx results of 29.  Gave copy to MD.  Took copy to Med Rec to scan.  

## 2012-08-16 ENCOUNTER — Telehealth: Payer: Self-pay | Admitting: *Deleted

## 2012-08-16 ENCOUNTER — Ambulatory Visit (HOSPITAL_BASED_OUTPATIENT_CLINIC_OR_DEPARTMENT_OTHER): Payer: Private Health Insurance - Indemnity | Admitting: Oncology

## 2012-08-16 VITALS — BP 137/92 | HR 89 | Temp 98.1°F | Resp 20 | Ht 66.0 in | Wt 213.8 lb

## 2012-08-16 DIAGNOSIS — C50419 Malignant neoplasm of upper-outer quadrant of unspecified female breast: Secondary | ICD-10-CM

## 2012-08-16 DIAGNOSIS — C50919 Malignant neoplasm of unspecified site of unspecified female breast: Secondary | ICD-10-CM

## 2012-08-16 DIAGNOSIS — Z170 Estrogen receptor positive status [ER+]: Secondary | ICD-10-CM

## 2012-08-16 MED ORDER — LORAZEPAM 0.5 MG PO TABS: 0.5000 mg | ORAL_TABLET | Freq: Four times a day (QID) | ORAL | Status: DC | PRN

## 2012-08-16 MED ORDER — DEXAMETHASONE 4 MG PO TABS: ORAL_TABLET | ORAL | Status: DC

## 2012-08-16 MED ORDER — PROCHLORPERAZINE MALEATE 10 MG PO TABS: 10.0000 mg | ORAL_TABLET | Freq: Four times a day (QID) | ORAL | Status: DC | PRN

## 2012-08-16 MED ORDER — ONDANSETRON HCL 8 MG PO TABS: ORAL_TABLET | ORAL | Status: DC

## 2012-08-16 MED ORDER — PROCHLORPERAZINE 25 MG RE SUPP: 25.0000 mg | Freq: Two times a day (BID) | RECTAL | Status: DC | PRN

## 2012-08-16 NOTE — Telephone Encounter (Signed)
Per staff message and POF I have scheduled appts.  JMW  

## 2012-08-16 NOTE — Progress Notes (Signed)
Hematology and Oncology Follow Up Visit  MARKEITA ALICIA [id number] 03/11/1961 51 y.o. 08/16/2012 1:41 PM   DIAGNOSIS:  ER/PR positive breast cancer Encounter Diagnosis  Name Primary?  . Malignant neoplasm of upper-outer quadrant of female breast Yes     PAST THERAPY:  Lumpectomy sentinel lymph node evaluation  Interim History:  51 year old woman returns after having lumpectomy and sentinel lymph node evaluation. We have obtained the results of her Oncotype testing. Final score returned at 28 with a risk of recurrence of 19%. We discussed the implications of this. She has agreed to go ahead with chemotherapy. We do have an outstanding test for her 2. I discussed going ahead with 4 cycles of Taxotere Cytoxan chemotherapy given every 3 weeks. I discussed side effects as well. I anticipate starting treatment on the 27th. She'll have chemotherapy teaching beforehand will not have a port placed. I will plan to see her the following week to check nadir counts and see how she is doing.  Medications: I have reviewed the patient's current medications.  Allergies:  Allergies  Allergen Reactions  . Sulfa Antibiotics Shortness Of Breath and Swelling    Past Medical History, Surgical history, Social history, and Family History were reviewed and updated.  Review of Systems: Constitutional:  Negative for fever, chills, night sweats, anorexia, weight loss, pain. Cardiovascular: no chest pain or dyspnea on exertion Respiratory: no cough, shortness of breath, or wheezing Neurological: negative Dermatological: negative ENT: negative Skin Gastrointestinal: negative Genito-Urinary: negative Hematological and Lymphatic: negative Breast: negative Musculoskeletal: negative Remaining ROS negative.  Physical Exam:  Blood pressure 137/92, pulse 89, temperature 98.1 F (36.7 C), temperature source Oral, resp. rate 20, height 5\' 6"  (1.676 m), weight 213 lb 12.8 oz (96.979 kg).  ECOG:  0      Lab Results: Lab Results  Component Value Date   WBC 5.2 08/02/2012   HGB 13.2 08/02/2012   HCT 38.5 08/02/2012   MCV 84.6 08/02/2012   PLT 224 08/02/2012     Chemistry      Component Value Date/Time   NA 136 08/02/2012 1318   NA 140 11/24/2011 1625   K 4.2 08/02/2012 1318   K 3.8 11/24/2011 1625   CL 107 08/02/2012 1318   CL 106 11/24/2011 1625   CO2 20* 08/02/2012 1318   CO2 26 11/24/2011 1625   BUN 8.0 08/02/2012 1318   BUN 9 11/24/2011 1625   CREATININE 0.8 08/02/2012 1318   CREATININE 0.79 11/24/2011 1625   CREATININE 0.72 05/02/2011 2018      Component Value Date/Time   CALCIUM 9.0 11/24/2011 1625   ALKPHOS 58 08/02/2012 1318   ALKPHOS 43 11/24/2011 1625   AST 17 08/02/2012 1318   AST 16 11/24/2011 1625   ALT 23 08/02/2012 1318   ALT 19 11/24/2011 1625   BILITOT 0.30 08/02/2012 1318   BILITOT 0.2* 11/24/2011 1625       Radiological Studies:  No results found.   IMPRESSIONS AND PLAN: A 51 y.o. 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 Decision has been made to go ahead with chemotherapy and we will start next week. I have given her information about antiemetic coverage and Steri-Strips were local pharmacy. She will need some financial assistance to get these medications. I discussed answered all her questions today.  Spent more than half the time coordinating care, as well as discussion of BMI and its implications.      Ozro Russett 9/18/20131:41 PM  Cell [phone]

## 2012-08-16 NOTE — Telephone Encounter (Signed)
Gave patient appointments for chemo class on 08-17-2012 at 10:00am  Gave patient appointment for 08-24-2012 with lab and md   Gave patient injections appointments day after treatment  Sent michele email to set up patient's treatment

## 2012-08-17 ENCOUNTER — Other Ambulatory Visit: Payer: Private Health Insurance - Indemnity

## 2012-08-17 ENCOUNTER — Encounter: Payer: Self-pay | Admitting: *Deleted

## 2012-08-17 ENCOUNTER — Telehealth: Payer: Self-pay | Admitting: *Deleted

## 2012-08-17 NOTE — Telephone Encounter (Signed)
Sent michelle email to move 09-05-2012 to 09-15-2012

## 2012-08-18 ENCOUNTER — Ambulatory Visit (HOSPITAL_COMMUNITY)
Admission: RE | Admit: 2012-08-18 | Discharge: 2012-08-18 | Disposition: A | Discharge status: Home / Self Care | Payer: Private Health Insurance - Indemnity | Source: Ambulatory Visit | Attending: Obstetrics and Gynecology | Admitting: Obstetrics and Gynecology

## 2012-08-18 DIAGNOSIS — Z90710 Acquired absence of both cervix and uterus: Secondary | ICD-10-CM | POA: Insufficient documentation

## 2012-08-18 DIAGNOSIS — N8300 Follicular cyst of ovary, unspecified side: Secondary | ICD-10-CM | POA: Insufficient documentation

## 2012-08-18 DIAGNOSIS — N83209 Unspecified ovarian cyst, unspecified side: Secondary | ICD-10-CM | POA: Insufficient documentation

## 2012-08-18 DIAGNOSIS — N949 Unspecified condition associated with female genital organs and menstrual cycle: Secondary | ICD-10-CM | POA: Insufficient documentation

## 2012-08-18 DIAGNOSIS — C50919 Malignant neoplasm of unspecified site of unspecified female breast: Secondary | ICD-10-CM | POA: Insufficient documentation

## 2012-08-18 DIAGNOSIS — R102 Pelvic and perineal pain: Secondary | ICD-10-CM

## 2012-08-22 NOTE — Addendum Note (Signed)
Encounter addended by: Tessa Lerner, RN on: 08/22/2012 11:31 AM<BR>     Documentation filed: Charges VN

## 2012-08-23 ENCOUNTER — Telehealth: Payer: Self-pay | Admitting: *Deleted

## 2012-08-23 NOTE — Telephone Encounter (Signed)
per desk nurse move patient to 08-30-2012 lab and md per the md not in the office on 09-01-2012 

## 2012-08-24 ENCOUNTER — Ambulatory Visit (HOSPITAL_BASED_OUTPATIENT_CLINIC_OR_DEPARTMENT_OTHER): Payer: Managed Care, Other (non HMO) | Admitting: Oncology

## 2012-08-24 ENCOUNTER — Other Ambulatory Visit (HOSPITAL_BASED_OUTPATIENT_CLINIC_OR_DEPARTMENT_OTHER): Payer: Managed Care, Other (non HMO) | Admitting: Lab

## 2012-08-24 ENCOUNTER — Other Ambulatory Visit: Payer: Private Health Insurance - Indemnity | Admitting: Lab

## 2012-08-24 VITALS — BP 125/72 | HR 105 | Temp 98.5°F | Resp 20 | Ht 66.0 in | Wt 211.7 lb

## 2012-08-24 DIAGNOSIS — C50419 Malignant neoplasm of upper-outer quadrant of unspecified female breast: Secondary | ICD-10-CM

## 2012-08-24 DIAGNOSIS — Z170 Estrogen receptor positive status [ER+]: Secondary | ICD-10-CM

## 2012-08-24 DIAGNOSIS — C50912 Malignant neoplasm of unspecified site of left female breast: Secondary | ICD-10-CM

## 2012-08-24 DIAGNOSIS — C50919 Malignant neoplasm of unspecified site of unspecified female breast: Secondary | ICD-10-CM

## 2012-08-24 LAB — BASIC METABOLIC PANEL (CC13)
CO2: 22 mEq/L (ref 22–29)
Calcium: 9.8 mg/dL (ref 8.4–10.4)
Creatinine: 0.8 mg/dL (ref 0.6–1.1)
Sodium: 136 mEq/L (ref 136–145)

## 2012-08-24 LAB — CBC WITH DIFFERENTIAL/PLATELET
BASO%: 0.5 % (ref 0.0–2.0)
HCT: 40 % (ref 34.8–46.6)
LYMPH%: 27.5 % (ref 14.0–49.7)
MCH: 29.3 pg (ref 25.1–34.0)
MCHC: 33.6 g/dL (ref 31.5–36.0)
MCV: 87.3 fL (ref 79.5–101.0)
MONO%: 6.5 % (ref 0.0–14.0)
NEUT%: 64.5 % (ref 38.4–76.8)
Platelets: 232 10*3/uL (ref 145–400)
RBC: 4.58 10*6/uL (ref 3.70–5.45)
WBC: 8.3 10*3/uL (ref 3.9–10.3)

## 2012-08-24 NOTE — Progress Notes (Signed)
Hematology and Oncology Follow Up Visit  Joann Page [id number] [birth year] 51 y.o. 08/24/2012 3:52 PM   DIAGNOSIS:  ER/PR positive breast cancer No diagnosis found.   PAST THERAPY:  Lumpectomy sentinel lymph node evaluation  Interim History:  51 year old woman returns after having lumpectomy and sentinel lymph node evaluation. We have obtained the results of her Oncotype testing. Final score returned at 28 with a risk of recurrence of 19%. We discussed the implications of this. She has agreed to go ahead with chemotherapy. We do have an outstanding test for her 2. I discussed going ahead with 4 cycles of Taxotere Cytoxan chemotherapy given every 3 weeks. I discussed side effects as well. I anticipate starting treatment on the 27th. She'll have chemotherapy teaching beforehand will not have a port placed. I will plan to see her the following week to check nadir counts and see how she is doing.  Medications: I have reviewed the patient's current medications.  Allergies:  Allergies  Allergen Reactions  . Sulfa Antibiotics Shortness Of Breath and Swelling    Past Medical History, Surgical history, Social history, and Family History were reviewed and updated.  Review of Systems: Constitutional:  Negative for fever, chills, night sweats, anorexia, weight loss, pain. Cardiovascular: no chest pain or dyspnea on exertion Respiratory: no cough, shortness of breath, or wheezing Neurological: negative Dermatological: negative ENT: negative Skin Gastrointestinal: negative Genito-Urinary: negative Hematological and Lymphatic: negative Breast: negative Musculoskeletal: negative Remaining ROS negative.  Physical Exam:  Blood pressure 125/72, pulse 105, temperature 98.5 F (36.9 C), temperature source Oral, resp. rate 20, height 5\' 6"  (1.676 m), weight 211 lb 11.2 oz (96.026 kg).  ECOG: 0      Lab Results: Lab Results  Component Value Date   WBC 8.3 08/24/2012   HGB 13.4  08/24/2012   HCT 40.0 08/24/2012   MCV 87.3 08/24/2012   PLT 232 08/24/2012     Chemistry      Component Value Date/Time   NA 136 08/02/2012 1318   NA 140 11/24/2011 1625   K 4.2 08/02/2012 1318   K 3.8 11/24/2011 1625   CL 107 08/02/2012 1318   CL 106 11/24/2011 1625   CO2 20* 08/02/2012 1318   CO2 26 11/24/2011 1625   BUN 8.0 08/02/2012 1318   BUN 9 11/24/2011 1625   CREATININE 0.8 08/02/2012 1318   CREATININE 0.79 11/24/2011 1625   CREATININE 0.72 05/02/2011 2018      Component Value Date/Time   CALCIUM 9.0 11/24/2011 1625   ALKPHOS 58 08/02/2012 1318   ALKPHOS 43 11/24/2011 1625   AST 17 08/02/2012 1318   AST 16 11/24/2011 1625   ALT 23 08/02/2012 1318   ALT 19 11/24/2011 1625   BILITOT 0.30 08/02/2012 1318   BILITOT 0.2* 11/24/2011 1625       Radiological Studies:  No results found.   IMPRESSIONS AND PLAN: A 51 y.o. 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 Decision has been made to go ahead with chemotherapy and she is due to start tomorrow. Once again went through side effects and answered her questions. The treatment plan and further appointments are in place. Spent more than half the time coordinating care, as well as discussion of BMI and its implications.      Nevyn Bossman 9/26/20133:52 PM Cell [phone]

## 2012-08-25 ENCOUNTER — Ambulatory Visit (HOSPITAL_BASED_OUTPATIENT_CLINIC_OR_DEPARTMENT_OTHER): Payer: Managed Care, Other (non HMO)

## 2012-08-25 VITALS — BP 133/90 | HR 80 | Temp 97.1°F | Resp 18

## 2012-08-25 DIAGNOSIS — C50419 Malignant neoplasm of upper-outer quadrant of unspecified female breast: Secondary | ICD-10-CM

## 2012-08-25 DIAGNOSIS — Z5111 Encounter for antineoplastic chemotherapy: Secondary | ICD-10-CM

## 2012-08-25 DIAGNOSIS — C50919 Malignant neoplasm of unspecified site of unspecified female breast: Secondary | ICD-10-CM

## 2012-08-25 MED ORDER — SODIUM CHLORIDE 0.9 % IV SOLN
Freq: Once | INTRAVENOUS | Status: AC
  Administered 2012-08-25: 13:00:00 via INTRAVENOUS

## 2012-08-25 MED ORDER — SODIUM CHLORIDE 0.9 % IV SOLN
600.0000 mg/m2 | Freq: Once | INTRAVENOUS | Status: AC
  Administered 2012-08-25: 1280 mg via INTRAVENOUS
  Filled 2012-08-25: qty 64

## 2012-08-25 MED ORDER — ONDANSETRON 16 MG/50ML IVPB (CHCC)
16.0000 mg | Freq: Once | INTRAVENOUS | Status: AC
  Administered 2012-08-25: 16 mg via INTRAVENOUS

## 2012-08-25 MED ORDER — DOCETAXEL CHEMO INJECTION 160 MG/16ML
75.0000 mg/m2 | Freq: Once | INTRAVENOUS | Status: AC
  Administered 2012-08-25: 160 mg via INTRAVENOUS
  Filled 2012-08-25: qty 16

## 2012-08-25 MED ORDER — DEXAMETHASONE SODIUM PHOSPHATE 4 MG/ML IJ SOLN
20.0000 mg | Freq: Once | INTRAMUSCULAR | Status: AC
  Administered 2012-08-25: 20 mg via INTRAVENOUS

## 2012-08-25 NOTE — Patient Instructions (Addendum)
[facility] Discharge Instructions for Patients Receiving Chemotherapy  Today you received the following chemotherapy agents taxotere/cytoxan  To help prevent nausea and vomiting after your treatment, we encourage you to take your nausea medication and take it as often as prescribed If you develop nausea and vomiting that is not controlled by your nausea medication, call the clinic. If it is after clinic hours your family physician or the after hours number for the clinic or go to the Emergency Department.   BELOW ARE SYMPTOMS THAT SHOULD BE REPORTED IMMEDIATELY:  *FEVER GREATER THAN 100.5 F  *CHILLS WITH OR WITHOUT FEVER  NAUSEA AND VOMITING THAT IS NOT CONTROLLED WITH YOUR NAUSEA MEDICATION  *UNUSUAL SHORTNESS OF BREATH  *UNUSUAL BRUISING OR BLEEDING  TENDERNESS IN MOUTH AND THROAT WITH OR WITHOUT PRESENCE OF ULCERS  *URINARY PROBLEMS  *BOWEL PROBLEMS  UNUSUAL RASH Items with * indicate a potential emergency and should be followed up as soon as possible.  One of the nurses will contact you 24 hours after your treatment. Please let the nurse know about any problems that you may have experienced. Feel free to call the clinic you have any questions or concerns. The clinic phone number is [phone].   I have been informed and understand all the instructions given to me. I know to contact the clinic, my physician, or go to the Emergency Department if any problems should occur. I do not have any questions at this time, but understand that I may call the clinic during office hours   should I have any questions or need assistance in obtaining follow up care.    __________________________________________  _____________  __________ Signature of Patient or Authorized Representative            Date                   Time    __________________________________________ Nurse's Signature

## 2012-08-26 ENCOUNTER — Ambulatory Visit (HOSPITAL_BASED_OUTPATIENT_CLINIC_OR_DEPARTMENT_OTHER): Payer: Managed Care, Other (non HMO)

## 2012-08-26 VITALS — BP 148/87 | HR 98 | Temp 97.6°F

## 2012-08-26 DIAGNOSIS — C50419 Malignant neoplasm of upper-outer quadrant of unspecified female breast: Secondary | ICD-10-CM

## 2012-08-26 DIAGNOSIS — Z5189 Encounter for other specified aftercare: Secondary | ICD-10-CM

## 2012-08-26 DIAGNOSIS — C50919 Malignant neoplasm of unspecified site of unspecified female breast: Secondary | ICD-10-CM

## 2012-08-26 MED ORDER — PEGFILGRASTIM INJECTION 6 MG/0.6ML
6.0000 mg | Freq: Once | SUBCUTANEOUS | Status: AC
  Administered 2012-08-26: 6 mg via SUBCUTANEOUS

## 2012-08-28 ENCOUNTER — Ambulatory Visit (HOSPITAL_BASED_OUTPATIENT_CLINIC_OR_DEPARTMENT_OTHER): Payer: Managed Care, Other (non HMO) | Admitting: Genetic Counselor

## 2012-08-28 ENCOUNTER — Encounter: Payer: Self-pay | Admitting: Genetic Counselor

## 2012-08-28 ENCOUNTER — Other Ambulatory Visit: Payer: Managed Care, Other (non HMO) | Admitting: Lab

## 2012-08-28 ENCOUNTER — Other Ambulatory Visit: Payer: Self-pay | Admitting: *Deleted

## 2012-08-28 DIAGNOSIS — C50919 Malignant neoplasm of unspecified site of unspecified female breast: Secondary | ICD-10-CM

## 2012-08-28 DIAGNOSIS — IMO0002 Reserved for concepts with insufficient information to code with codable children: Secondary | ICD-10-CM

## 2012-08-28 DIAGNOSIS — C50419 Malignant neoplasm of upper-outer quadrant of unspecified female breast: Secondary | ICD-10-CM

## 2012-08-28 DIAGNOSIS — Z803 Family history of malignant neoplasm of breast: Secondary | ICD-10-CM

## 2012-08-28 DIAGNOSIS — Z800 Family history of malignant neoplasm of digestive organs: Secondary | ICD-10-CM

## 2012-08-28 NOTE — Progress Notes (Signed)
Dr.  Pierce Crane requested a consultation for genetic counseling and risk assessment for Joann Page, a 51 y.o. female, for discussion of her personal history of breast cancer and family history of breast and colon cancer. She presents to clinic today to discuss the possibility of a genetic predisposition to cancer, and to further clarify her risks, as well as her family members' risks for cancer.   HISTORY OF PRESENT ILLNESS: In 2013, at the age of 61, Joann Page was diagnosed with invasive ductal carcinoma of the breast. This was treated with chemotherapy and lumpectomy.    Past Medical History  Diagnosis Date  . 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 Knee pain, right   . Degenerative disk disease 2010    Lumbar spine  . Obesity   . Cancer 07/07/12    Left Breast Inv Ductal Ca.  . No pertinent past medical history   . GERD (gastroesophageal reflux disease)     hx  . H/O hiatal hernia     small  . Hyperlipidemia   . Breast cancer 2013    at age 66    Past Surgical History  Procedure Date  . Abdominal hysterectomy 04/2000  . Breath tek h pylori 12/07/2011    Procedure: BREATH TEK H PYLORI;  Surgeon: Kandis Cocking, MD;  Location: [facility];  Service: Endoscopy;  Laterality: N/A;  . Needle core biopsy 06/30/12    Left Breast: 12 O'clock/ Benign Parenchyma  . 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    History  Substance Use Topics  . Smoking status: Never Smoker   . Smokeless tobacco: Never Used  . Alcohol Use: No     menses age 68, no HRT or birth control pills     REPRODUCTIVE HISTORY AND PERSONAL RISK ASSESSMENT FACTORS: Menarche was at age 35.   Premenopausal Uterus Intact: No, TAH for fibroids Ovaries Intact: Yes G1P1A0 , first  live birth at age 52  She has not previously undergone treatment for infertility.   Never used OCPs   She has not used HRT in the past.    FAMILY HISTORY:  We obtained a detailed, 4-generation family history.  Significant diagnoses are listed below: Family History  Problem Relation Age of Onset  . Colon cancer Father 68  . Colon polyps Brother   . Breast cancer Sister 50  . Obesity Sister   . Lymphoma Brother   The patient was diagnosed with breast cancer at age 38.  Her sister was diagnosed with breast cancer at age 25 and died at 72.  Her father was diagnosed with colon cancer at age 73 and died at 62.  There is no other family history of cancer on either side of her family.  Patient's maternal ancestors are of unknown descent, and paternal ancestors are of unknown descent. There is no reported Ashkenazi Jewish ancestry. There is no known consanguinity.  GENETIC COUNSELING RISK ASSESSMENT, DISCUSSION, AND SUGGESTED FOLLOW UP: We reviewed the natural history and genetic etiology of sporadic, familial and hereditary cancer syndromes.  About 5-10% of breast cancer is hereditary.  Of this, about 85% is the result of a BRCA1 or BRCA2 mutation.  We reviewed the red flags of hereditary cancer syndromes and the  dominant inheritance patterns.  If the BRCA testing is negative, we discussed that we could be testing for the wrong gene.  We discussed gene panels, and that several cancer genes that are associated with different cancers can be tested at the same time.  Because of the early onset of breast cancer that are in the patient's family, we will order the BRCAPlus test.   The patient's personal and family history of early onset breast cancer is suggestive of the following possible diagnosis: hereditary cancer syndrome  We discussed that identification of a hereditary cancer syndrome may help her care providers tailor the patients medical management. If a mutation indicating a hereditary cancer  syndrome is detected in this case, the [employer] recommendations would include increased cancer surviellance and possible prophylactic surgery. If a mutation is detected, the patient will be referred back to the referring provider and to any additional appropriate care providers to discuss the relevant options.   If a mutation is not found in the patient, this will decrease the likelihood of hereditary cancer syndrome as the explanation for her breast cancer. Cancer surveillance options would be discussed for the patient according to the appropriate standard National Comprehensive Cancer Network and American Cancer Society guidelines, with consideration of their personal and family history risk factors. In this case, the patient will be referred back to their care providers for discussions of management.   In order to estimate her chance of having a BRCA2 or BRCA2 mutation, we used statistical models (Penn II) and laboratory data that take into account her personal medical history, family history and ancestry.  Because each model is different, there can be a lot of variability in the risks they give.  Therefore, these numbers must be considered a rough range and not a precise risk of having a BRCA1 or BRCA2 mutation.  These models estimate that she has approximately a 19% chance of having a mutation. Based on this assessment of her family and personal history, genetic testing is recommended.  After considering the risks, benefits, and limitations, the patient provided informed consent for  the following  testing: BRCAPlus through [employer]   Per the patient's request, we will contact her by telephone to discuss these results. A follow up genetic counseling visit will be scheduled if indicated.  The patient was seen for a total of 60 minutes, greater than 50% of which was spent face-to-face counseling.  This plan is being carried out per Dr. Pierce Crane recommendations.   This note will also be sent to the referring provider via the electronic medical record. The patient will be supplied with a summary of this genetic counseling discussion as well as educational information on the discussed hereditary cancer syndromes following the conclusion of their visit.   Patient was discussed with Dr. Drue Second.    _______________________________________________________________________ For Office Staff:  Number of people involved in session: 3 Was an Intern/ student involved with case: yes

## 2012-08-29 ENCOUNTER — Other Ambulatory Visit: Payer: Self-pay | Admitting: *Deleted

## 2012-08-29 DIAGNOSIS — C50912 Malignant neoplasm of unspecified site of left female breast: Secondary | ICD-10-CM

## 2012-08-29 DIAGNOSIS — C50419 Malignant neoplasm of upper-outer quadrant of unspecified female breast: Secondary | ICD-10-CM

## 2012-08-30 ENCOUNTER — Ambulatory Visit (HOSPITAL_BASED_OUTPATIENT_CLINIC_OR_DEPARTMENT_OTHER): Payer: Managed Care, Other (non HMO) | Admitting: Oncology

## 2012-08-30 ENCOUNTER — Other Ambulatory Visit (HOSPITAL_BASED_OUTPATIENT_CLINIC_OR_DEPARTMENT_OTHER): Payer: Managed Care, Other (non HMO) | Admitting: Lab

## 2012-08-30 VITALS — BP 115/83 | HR 99 | Temp 97.0°F | Resp 20 | Ht 66.0 in | Wt 208.2 lb

## 2012-08-30 DIAGNOSIS — C50919 Malignant neoplasm of unspecified site of unspecified female breast: Secondary | ICD-10-CM

## 2012-08-30 DIAGNOSIS — Z170 Estrogen receptor positive status [ER+]: Secondary | ICD-10-CM

## 2012-08-30 DIAGNOSIS — C50912 Malignant neoplasm of unspecified site of left female breast: Secondary | ICD-10-CM

## 2012-08-30 DIAGNOSIS — C50419 Malignant neoplasm of upper-outer quadrant of unspecified female breast: Secondary | ICD-10-CM

## 2012-08-30 LAB — COMPREHENSIVE METABOLIC PANEL (CC13)
ALT: 16 U/L (ref 0–55)
Alkaline Phosphatase: 64 U/L (ref 40–150)
Creatinine: 0.9 mg/dL (ref 0.6–1.1)
Glucose: 95 mg/dl (ref 70–99)
Sodium: 134 mEq/L — ABNORMAL LOW (ref 136–145)
Total Bilirubin: 0.8 mg/dL (ref 0.20–1.20)
Total Protein: 6.2 g/dL — ABNORMAL LOW (ref 6.4–8.3)

## 2012-08-30 LAB — CBC WITH DIFFERENTIAL/PLATELET
BASO%: 0.4 % (ref 0.0–2.0)
HCT: 39.3 % (ref 34.8–46.6)
LYMPH%: 39.1 % (ref 14.0–49.7)
MCH: 29.3 pg (ref 25.1–34.0)
MCHC: 33.7 g/dL (ref 31.5–36.0)
MCV: 87 fL (ref 79.5–101.0)
MONO%: 4.9 % (ref 0.0–14.0)
NEUT%: 54.4 % (ref 38.4–76.8)
Platelets: 121 10*3/uL — ABNORMAL LOW (ref 145–400)
RBC: 4.51 10*6/uL (ref 3.70–5.45)

## 2012-08-30 NOTE — Progress Notes (Signed)
Hematology and Oncology Follow Up Visit  Joann Page [id number] 1961-09-30 51 y.o. 08/30/2012 2:51 PM   DIAGNOSIS:  ER/PR positive breast cancer No diagnosis found.   PAST THERAPY:  Lumpectomy sentinel lymph node evaluation This is day 7 cycle one of TC chemotherapy  Interim History:  51 year old woman returns after having lumpectomy and sentinel lymph node evaluation. We have obtained the results of her Oncotype testing. Final score returned at 28 with a risk of recurrence of 19%. We discussed the implications of this. She has agreed to go ahead with chemotherapy. We do have an outstanding test for her 2. This is negative. She did well with her first cycle of chemotherapy. She is little despondent today. She suddenly genetics counselor yesterday and are not clear there is some misunderstanding of the information. I reemphasize that her prognosis is excellent. She had a little bit of some irritation today and yesterday but otherwise is doing well has not any nausea vomiting or diarrhea. She feels otherwise well..  Medications: I have reviewed the patient's current medications.  Allergies:  Allergies  Allergen Reactions  . Sulfa Antibiotics Shortness Of Breath and Swelling    Past Medical History, Surgical history, Social history, and Family History were reviewed and updated.  Review of Systems: Constitutional:  Negative for fever, chills, night sweats, anorexia, weight loss, pain. Cardiovascular: no chest pain or dyspnea on exertion Respiratory: no cough, shortness of breath, or wheezing Neurological: negative Dermatological: negative ENT: negative Skin Gastrointestinal: negative Genito-Urinary: negative Hematological and Lymphatic: negative Breast: negative Musculoskeletal: negative Remaining ROS negative.  Physical Exam:  Blood pressure 115/83, pulse 99, temperature 97 F (36.1 C), resp. rate 20, height 5\' 6"  (1.676 m), weight 208 lb 3.2 oz (94.439 kg).  ECOG:  0  HEENT:  Sclerae anicteric, conjunctivae pink.  Oropharynx clear.  No mucositis or candidiasis.  Nodes:  No cervical, supraclavicular, or axillary lymphadenopathy palpated.  Breast Exam:  Right breast is benign.  No masses, discharge, skin change, or nipple inversion.  Clear to auscultation bilaterally.  No crackles, rhonchi, or wheezes.  Heart:  Regular rate and rhythm.  Abdomen:  Soft, nontender.  Positive bowel sounds.  No organomegaly or masses palpated.  Musculoskeletal:  No focal spinal tenderness to palpation.  Extremities:  Benign.  No peripheral edema or cyanosis.  Skin:  Benign.  Neuro:  Nonfocal.     Lab Results: Lab Results  Component Value Date   WBC 2.6* 08/30/2012   HGB 13.2 08/30/2012   HCT 39.3 08/30/2012   MCV 87.0 08/30/2012   PLT 121* 08/30/2012     Chemistry      Component Value Date/Time   NA 134* 08/30/2012 1349   NA 140 11/24/2011 1625   K 3.8 08/30/2012 1349   K 3.8 11/24/2011 1625   CL 102 08/30/2012 1349   CL 106 11/24/2011 1625   CO2 21* 08/30/2012 1349   CO2 26 11/24/2011 1625   BUN 13.0 08/30/2012 1349   BUN 9 11/24/2011 1625   CREATININE 0.9 08/30/2012 1349   CREATININE 0.79 11/24/2011 1625   CREATININE 0.72 05/02/2011 2018      Component Value Date/Time   CALCIUM 8.5 08/30/2012 1349   CALCIUM 9.0 11/24/2011 1625   ALKPHOS 64 08/30/2012 1349   ALKPHOS 43 11/24/2011 1625   AST 10 08/30/2012 1349   AST 16 11/24/2011 1625   ALT 16 08/30/2012 1349   ALT 19 11/24/2011 1625   BILITOT 0.80 08/30/2012 1349   BILITOT 0.2* 11/24/2011 1625  Radiological Studies:  No results found.   IMPRESSIONS AND PLAN: A 51 y.o. 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 Patient is doing fairly well. She'll return in 2 weeks for cycle 2. Her clinical exams unremarkable today. Her labs are within normal limits.  Spent more than half the time coordinating care, as well as discussion of BMI and its  implications.      Naven Giambalvo 10/2/20132:51 PM Cell [phone]

## 2012-08-31 ENCOUNTER — Telehealth: Payer: Self-pay | Admitting: *Deleted

## 2012-08-31 ENCOUNTER — Other Ambulatory Visit: Payer: Self-pay | Admitting: *Deleted

## 2012-08-31 ENCOUNTER — Other Ambulatory Visit: Payer: Private Health Insurance - Indemnity | Admitting: Lab

## 2012-08-31 ENCOUNTER — Ambulatory Visit: Payer: Private Health Insurance - Indemnity | Admitting: Oncology

## 2012-08-31 DIAGNOSIS — G8918 Other acute postprocedural pain: Secondary | ICD-10-CM

## 2012-08-31 MED ORDER — HYDROCODONE-ACETAMINOPHEN 5-325 MG PO TABS: 1.0000 | ORAL_TABLET | ORAL | Status: DC | PRN

## 2012-08-31 NOTE — Telephone Encounter (Signed)
Patient called asking about pain medicine for back arm and leg pain she is feeling after neulasta on 08-26-2012.  Has taken claritin daily through Monday.  Started tylenol after this stopped working.  Took Norco yesterday which helped but once she woke up the pain was still there.  Instructed to try ibuprofen and a heating pad.  Has advil on hand and will try this.  Next scheduled f/u is 09-15-2012.

## 2012-09-01 ENCOUNTER — Other Ambulatory Visit: Payer: Self-pay | Admitting: *Deleted

## 2012-09-01 DIAGNOSIS — C50419 Malignant neoplasm of upper-outer quadrant of unspecified female breast: Secondary | ICD-10-CM

## 2012-09-01 MED ORDER — LORAZEPAM 0.5 MG PO TABS: 0.5000 mg | ORAL_TABLET | Freq: Four times a day (QID) | ORAL | Status: DC | PRN

## 2012-09-04 ENCOUNTER — Encounter: Payer: Self-pay | Admitting: Oncology

## 2012-09-04 NOTE — Progress Notes (Signed)
Called patient and advised her would need a copy of [facility]'s approval discount letter. She will bring into me on Tuesday 09/05/12.

## 2012-09-04 NOTE — Progress Notes (Signed)
Faxed discount letter to [employer], Diplomatic Services operational officer and [facility] [employer]. Ms. Mcglinn qualified for 100% ind 03/30/12 - 09/30/12.

## 2012-09-05 ENCOUNTER — Ambulatory Visit: Payer: Private Health Insurance - Indemnity

## 2012-09-14 ENCOUNTER — Other Ambulatory Visit: Payer: Self-pay | Admitting: *Deleted

## 2012-09-14 ENCOUNTER — Telehealth: Payer: Self-pay | Admitting: *Deleted

## 2012-09-14 DIAGNOSIS — C50912 Malignant neoplasm of unspecified site of left female breast: Secondary | ICD-10-CM

## 2012-09-14 NOTE — Telephone Encounter (Signed)
Called patient back from left voice message confirmed over the phone the correct time on 09-15-2012 starting at 9:00am patient stated that the automated recording stated patient to arrive at 10:00am

## 2012-09-15 ENCOUNTER — Other Ambulatory Visit (HOSPITAL_BASED_OUTPATIENT_CLINIC_OR_DEPARTMENT_OTHER): Payer: Managed Care, Other (non HMO) | Admitting: Lab

## 2012-09-15 ENCOUNTER — Ambulatory Visit (HOSPITAL_BASED_OUTPATIENT_CLINIC_OR_DEPARTMENT_OTHER): Payer: Managed Care, Other (non HMO)

## 2012-09-15 ENCOUNTER — Ambulatory Visit (HOSPITAL_BASED_OUTPATIENT_CLINIC_OR_DEPARTMENT_OTHER): Payer: Managed Care, Other (non HMO) | Admitting: Oncology

## 2012-09-15 VITALS — BP 118/79 | HR 87 | Temp 98.3°F | Resp 20 | Ht 66.0 in | Wt 206.0 lb

## 2012-09-15 DIAGNOSIS — Z170 Estrogen receptor positive status [ER+]: Secondary | ICD-10-CM

## 2012-09-15 DIAGNOSIS — C50419 Malignant neoplasm of upper-outer quadrant of unspecified female breast: Secondary | ICD-10-CM

## 2012-09-15 DIAGNOSIS — C50919 Malignant neoplasm of unspecified site of unspecified female breast: Secondary | ICD-10-CM

## 2012-09-15 DIAGNOSIS — C50912 Malignant neoplasm of unspecified site of left female breast: Secondary | ICD-10-CM

## 2012-09-15 DIAGNOSIS — Z5111 Encounter for antineoplastic chemotherapy: Secondary | ICD-10-CM

## 2012-09-15 LAB — CBC WITH DIFFERENTIAL/PLATELET
Eosinophils Absolute: 0 10*3/uL (ref 0.0–0.5)
LYMPH%: 14.5 % (ref 14.0–49.7)
MCH: 28.7 pg (ref 25.1–34.0)
MCHC: 33.4 g/dL (ref 31.5–36.0)
MCV: 86 fL (ref 79.5–101.0)
MONO%: 8.2 % (ref 0.0–14.0)
NEUT#: 6.5 10*3/uL (ref 1.5–6.5)
Platelets: 247 10*3/uL (ref 145–400)
RBC: 4.35 10*6/uL (ref 3.70–5.45)
nRBC: 0 % (ref 0–0)

## 2012-09-15 MED ORDER — DOCETAXEL CHEMO INJECTION 160 MG/16ML
75.0000 mg/m2 | Freq: Once | INTRAVENOUS | Status: AC
  Administered 2012-09-15: 160 mg via INTRAVENOUS
  Filled 2012-09-15: qty 16

## 2012-09-15 MED ORDER — ONDANSETRON 16 MG/50ML IVPB (CHCC)
16.0000 mg | Freq: Once | INTRAVENOUS | Status: AC
  Administered 2012-09-15: 16 mg via INTRAVENOUS

## 2012-09-15 MED ORDER — SODIUM CHLORIDE 0.9 % IV SOLN
Freq: Once | INTRAVENOUS | Status: AC
  Administered 2012-09-15: 11:00:00 via INTRAVENOUS

## 2012-09-15 MED ORDER — DEXAMETHASONE SODIUM PHOSPHATE 4 MG/ML IJ SOLN
20.0000 mg | Freq: Once | INTRAMUSCULAR | Status: AC
  Administered 2012-09-15: 20 mg via INTRAVENOUS

## 2012-09-15 MED ORDER — SODIUM CHLORIDE 0.9 % IV SOLN
600.0000 mg/m2 | Freq: Once | INTRAVENOUS | Status: AC
  Administered 2012-09-15: 1280 mg via INTRAVENOUS
  Filled 2012-09-15: qty 64

## 2012-09-15 NOTE — Patient Instructions (Addendum)
[facility] Discharge Instructions for Patients Receiving Chemotherapy  Today you received the following chemotherapy agents Taxotere and Cytoxan.  To help prevent nausea and vomiting after your treatment, we encourage you to take your nausea medication.   If you develop nausea and vomiting that is not controlled by your nausea medication, call the clinic. If it is after clinic hours your family physician or the after hours number for the clinic or go to the Emergency Department.   BELOW ARE SYMPTOMS THAT SHOULD BE REPORTED IMMEDIATELY:  *FEVER GREATER THAN 100.5 F  *CHILLS WITH OR WITHOUT FEVER  NAUSEA AND VOMITING THAT IS NOT CONTROLLED WITH YOUR NAUSEA MEDICATION  *UNUSUAL SHORTNESS OF BREATH  *UNUSUAL BRUISING OR BLEEDING  TENDERNESS IN MOUTH AND THROAT WITH OR WITHOUT PRESENCE OF ULCERS  *URINARY PROBLEMS  *BOWEL PROBLEMS  UNUSUAL RASH Items with * indicate a potential emergency and should be followed up as soon as possible.  One of the nurses will contact you 24 hours after your treatment. Please let the nurse know about any problems that you may have experienced. Feel free to call the clinic you have any questions or concerns. The clinic phone number is [phone].   I have been informed and understand all the instructions given to me. I know to contact the clinic, my physician, or go to the Emergency Department if any problems should occur. I do not have any questions at this time, but understand that I may call the clinic during office hours   should I have any questions or need assistance in obtaining follow up care.    __________________________________________  _____________  __________ Signature of Patient or Authorized Representative            Date                   Time    __________________________________________ Nurse's Signature

## 2012-09-15 NOTE — Progress Notes (Signed)
Hematology and Oncology Follow Up Visit  Joann Page [id number] 1961/05/12 51 y.o. 09/15/2012 11:00 AM   DIAGNOSIS:  ER/PR positive breast cancer No diagnosis found.   PAST THERAPY:  Lumpectomy sentinel lymph node evaluation This is day 1 cycle 2 of TC chemotherapy  Interim History:  Patient returns for followup. General doing pretty well. No specific complaints today. She did have an episode of diarrhea after 4 days of constipation with her first cycle of chemotherapy. This has resolved spontaneously. She is back to baseline. She had no other intercurrent problems with the other cycle of chemotherapy and so she is ready to take the next treatment today. Medications: I have reviewed the patient's current medications.  Allergies:  Allergies  Allergen Reactions  . Sulfa Antibiotics Shortness Of Breath and Swelling    Past Medical History, Surgical history, Social history, and Family History were reviewed and updated.  Review of Systems: Constitutional:  Negative for fever, chills, night sweats, anorexia, weight loss, pain. Cardiovascular: no chest pain or dyspnea on exertion Respiratory: no cough, shortness of breath, or wheezing Neurological: negative Dermatological: negative ENT: negative Skin Gastrointestinal: negative Genito-Urinary: negative Hematological and Lymphatic: negative Breast: negative Musculoskeletal: negative Remaining ROS negative.  Physical Exam:  Blood pressure 118/79, pulse 87, temperature 98.3 F (36.8 C), resp. rate 20, height 5\' 6"  (1.676 m), weight 206 lb (93.441 kg).  ECOG: 0  HEENT:  Sclerae anicteric, conjunctivae pink.  Oropharynx clear.  No mucositis or candidiasis.  Nodes:  No cervical, supraclavicular, or axillary lymphadenopathy palpated.  Breast Exam:  Right breast is benign.  No masses, discharge, skin change, or nipple inversion.  Clear to auscultation bilaterally.  No crackles, rhonchi, or wheezes.  Heart:  Regular rate and rhythm.   Abdomen:  Soft, nontender.  Positive bowel sounds.  No organomegaly or masses palpated.  Musculoskeletal:  No focal spinal tenderness to palpation.  Extremities:  Benign.  No peripheral edema or cyanosis.  Skin:  Benign.  Neuro:  Nonfocal.     Lab Results: Lab Results  Component Value Date   WBC 8.4 09/15/2012   HGB 12.5 09/15/2012   HCT 37.4 09/15/2012   MCV 86.0 09/15/2012   PLT 247 09/15/2012     Chemistry      Component Value Date/Time   NA 134* 08/30/2012 1349   NA 140 11/24/2011 1625   K 3.8 08/30/2012 1349   K 3.8 11/24/2011 1625   CL 102 08/30/2012 1349   CL 106 11/24/2011 1625   CO2 21* 08/30/2012 1349   CO2 26 11/24/2011 1625   BUN 13.0 08/30/2012 1349   BUN 9 11/24/2011 1625   CREATININE 0.9 08/30/2012 1349   CREATININE 0.79 11/24/2011 1625   CREATININE 0.72 05/02/2011 2018      Component Value Date/Time   CALCIUM 8.5 08/30/2012 1349   CALCIUM 9.0 11/24/2011 1625   ALKPHOS 64 08/30/2012 1349   ALKPHOS 43 11/24/2011 1625   AST 10 08/30/2012 1349   AST 16 11/24/2011 1625   ALT 16 08/30/2012 1349   ALT 19 11/24/2011 1625   BILITOT 0.80 08/30/2012 1349   BILITOT 0.2* 11/24/2011 1625       Radiological Studies:  No results found.   IMPRESSIONS AND PLAN: A 51 y.o. 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 Patient is doing fairly well. She'll return in 1 weeks for day 7  cycle 2. Her clinical exams unremarkable today. Her labs are within normal limits.  30 minutes spent with the patient half the time and patient-related counseling      Ceil Roderick 10/18/201311:00 AM Cell [phone]

## 2012-09-16 ENCOUNTER — Ambulatory Visit (HOSPITAL_BASED_OUTPATIENT_CLINIC_OR_DEPARTMENT_OTHER): Payer: Managed Care, Other (non HMO)

## 2012-09-16 VITALS — BP 135/86 | HR 76 | Temp 97.2°F

## 2012-09-16 DIAGNOSIS — C50419 Malignant neoplasm of upper-outer quadrant of unspecified female breast: Secondary | ICD-10-CM

## 2012-09-16 DIAGNOSIS — C50919 Malignant neoplasm of unspecified site of unspecified female breast: Secondary | ICD-10-CM

## 2012-09-16 MED ORDER — PEGFILGRASTIM INJECTION 6 MG/0.6ML
6.0000 mg | Freq: Once | SUBCUTANEOUS | Status: AC
  Administered 2012-09-16: 6 mg via SUBCUTANEOUS

## 2012-09-18 ENCOUNTER — Other Ambulatory Visit: Payer: Self-pay | Admitting: Certified Registered Nurse Anesthetist

## 2012-09-18 ENCOUNTER — Telehealth: Payer: Self-pay | Admitting: Genetic Counselor

## 2012-09-18 NOTE — Telephone Encounter (Signed)
Revealed negative BRCAPlus test results. 

## 2012-09-19 ENCOUNTER — Encounter: Payer: Self-pay | Admitting: Genetic Counselor

## 2012-09-21 ENCOUNTER — Encounter: Payer: Self-pay | Admitting: Oncology

## 2012-09-21 ENCOUNTER — Other Ambulatory Visit: Payer: Self-pay | Admitting: *Deleted

## 2012-09-21 DIAGNOSIS — C50912 Malignant neoplasm of unspecified site of left female breast: Secondary | ICD-10-CM

## 2012-09-21 NOTE — Progress Notes (Signed)
Patient left message on voicemail requesting help with personal bills. Per [employer] her remaining balance from the [employer] fund  is $10.74. Called Joann Page to advised [phone] number disconnected, called cell [phone] no answer. Left message to call me back.

## 2012-09-21 NOTE — Progress Notes (Signed)
Called patient back at [phone]. Advised her of balance in [employer] and not enough to pay another bill. Did advise her discount still good thru 09/30/12 and would new application for her to fill out for next 6 months. She inquired about other bills - advised her to call them and make arrangements if no other means. I advised her of the bills that Darlena faxed her discount letter to for her to get the discount. Mailing application to her today.

## 2012-09-22 ENCOUNTER — Ambulatory Visit (HOSPITAL_BASED_OUTPATIENT_CLINIC_OR_DEPARTMENT_OTHER): Payer: Managed Care, Other (non HMO) | Admitting: Oncology

## 2012-09-22 ENCOUNTER — Other Ambulatory Visit (HOSPITAL_BASED_OUTPATIENT_CLINIC_OR_DEPARTMENT_OTHER): Payer: Managed Care, Other (non HMO)

## 2012-09-22 VITALS — BP 119/83 | HR 106 | Temp 97.2°F | Resp 20 | Ht 66.0 in | Wt 196.0 lb

## 2012-09-22 DIAGNOSIS — C50912 Malignant neoplasm of unspecified site of left female breast: Secondary | ICD-10-CM

## 2012-09-22 DIAGNOSIS — Z170 Estrogen receptor positive status [ER+]: Secondary | ICD-10-CM

## 2012-09-22 DIAGNOSIS — C50919 Malignant neoplasm of unspecified site of unspecified female breast: Secondary | ICD-10-CM

## 2012-09-22 LAB — CBC WITH DIFFERENTIAL/PLATELET
BASO%: 0.4 % (ref 0.0–2.0)
LYMPH%: 15 % (ref 14.0–49.7)
MCHC: 33.3 g/dL (ref 31.5–36.0)
MCV: 87.1 fL (ref 79.5–101.0)
MONO%: 11.2 % (ref 0.0–14.0)
Platelets: 181 10*3/uL (ref 145–400)
RBC: 4.44 10*6/uL (ref 3.70–5.45)

## 2012-09-22 NOTE — Progress Notes (Signed)
Hematology and Oncology Follow Up Visit  MADILYNN MONTANTE [id number] 1961-01-12 51 y.o. 09/22/2012 1:38 PM   DIAGNOSIS:  ER/PR positive breast cancer No diagnosis found.   PAST THERAPY:  Lumpectomy sentinel lymph node evaluation This is day 7 cycle 2 of TC chemotherapy  Interim History:  Patient returns for followup. General doing pretty well. No specific complaints today. She has feels pretty well she has no specific complaints. She denies any problems with her hands tearing nausea vomiting. Appetite fairly good. Medications: I have reviewed the patient's current medications.  Allergies:  Allergies  Allergen Reactions  . Sulfa Antibiotics Shortness Of Breath and Swelling    Past Medical History, Surgical history, Social history, and Family History were reviewed and updated.  Review of Systems: Constitutional:  Negative for fever, chills, night sweats, anorexia, weight loss, pain. Cardiovascular: no chest pain or dyspnea on exertion Respiratory: no cough, shortness of breath, or wheezing Neurological: negative Dermatological: negative ENT: negative Skin Gastrointestinal: negative Genito-Urinary: negative Hematological and Lymphatic: negative Breast: negative Musculoskeletal: negative Remaining ROS negative.  Physical Exam:  Blood pressure 119/83, pulse 106, temperature 97.2 F (36.2 C), temperature source Oral, resp. rate 20, height 5\' 6"  (1.676 m), weight 196 lb (88.905 kg).  ECOG: 0  HEENT:  Sclerae anicteric, conjunctivae pink.  Oropharynx clear.  No mucositis or candidiasis.  Nodes:  No cervical, supraclavicular, or axillary lymphadenopathy palpated.  Breast Exam:  Right breast is benign.  No masses, discharge, skin change, or nipple inversion.  Clear to auscultation bilaterally.  No crackles, rhonchi, or wheezes.  Heart:  Regular rate and rhythm.  Abdomen:  Soft, nontender.  Positive bowel sounds.  No organomegaly or masses palpated.  Musculoskeletal:  No focal spinal  tenderness to palpation.  Extremities:  Benign.  No peripheral edema or cyanosis.  Skin:  Benign.  Neuro:  Nonfocal.     Lab Results: Lab Results  Component Value Date   WBC 5.3 09/22/2012   HGB 12.9 09/22/2012   HCT 38.7 09/22/2012   MCV 87.1 09/22/2012   PLT 181 09/22/2012     Chemistry      Component Value Date/Time   NA 134* 08/30/2012 1349   NA 140 11/24/2011 1625   K 3.8 08/30/2012 1349   K 3.8 11/24/2011 1625   CL 102 08/30/2012 1349   CL 106 11/24/2011 1625   CO2 21* 08/30/2012 1349   CO2 26 11/24/2011 1625   BUN 13.0 08/30/2012 1349   BUN 9 11/24/2011 1625   CREATININE 0.9 08/30/2012 1349   CREATININE 0.79 11/24/2011 1625   CREATININE 0.72 05/02/2011 2018      Component Value Date/Time   CALCIUM 8.5 08/30/2012 1349   CALCIUM 9.0 11/24/2011 1625   ALKPHOS 64 08/30/2012 1349   ALKPHOS 43 11/24/2011 1625   AST 10 08/30/2012 1349   AST 16 11/24/2011 1625   ALT 16 08/30/2012 1349   ALT 19 11/24/2011 1625   BILITOT 0.80 08/30/2012 1349   BILITOT 0.2* 11/24/2011 1625       Radiological Studies:  No results found.   IMPRESSIONS AND PLAN: A 51 y.o. 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 Patient is doing fairly well. She'll return in 1 weeks for day 7  cycle 2. Her clinical exams unremarkable today. Her labs are within normal limits. She'll return 2 weeks' time for cycle 3 day 1. 30 minutes spent with the patient half the time and patient-related counseling  Milo Schreier 10/25/20131:38 PM Cell [phone]

## 2012-09-26 ENCOUNTER — Telehealth (INDEPENDENT_AMBULATORY_CARE_PROVIDER_SITE_OTHER): Payer: Self-pay

## 2012-09-26 NOTE — Telephone Encounter (Signed)
I called and left the pt a message to call me back.  I reviewed her chart and the clips were removed at the time of surgery.

## 2012-09-26 NOTE — Telephone Encounter (Signed)
Message copied by Ivory Broad on Tue Sep 26, 2012  1:31 PM ------      Message from: [name]      Created: Tue Sep 26, 2012  9:21 AM      Regarding: Joann Page: [phone]       Patient stated that she is having some discomfort in her breast, mainly after chemotherapy. She also has some questions in regards to the placement of the clips before surgery and if they are still present in her breast now. Please call to discuss. Thank you.

## 2012-09-26 NOTE — Telephone Encounter (Signed)
The patient called me back.  I told her the clips were removed along with the lump and wire from the needle loc.  She states she has dull pain in the breast she had surgery on after she has chemo.  She can have the pain in different areas of her body such as her legs also.  She has no redness or swelling in the breast.  Once the chemo is out of her system she states the pain is gone.  I told her she may be still have some healing in the breast for up to 6 months.  I also said the bad thing about chemo is it affects the whole body so she may feel the effects all over.  She feels ok right now and will call if it doesn't get better.  I told her anytime she wants Dr Ezzard Standing to check her he can or Dr Donnie Coffin can check her.  She will call back if needed.

## 2012-10-04 ENCOUNTER — Other Ambulatory Visit: Payer: Self-pay | Admitting: *Deleted

## 2012-10-04 ENCOUNTER — Encounter: Payer: Self-pay | Admitting: *Deleted

## 2012-10-04 DIAGNOSIS — C50912 Malignant neoplasm of unspecified site of left female breast: Secondary | ICD-10-CM

## 2012-10-04 NOTE — Progress Notes (Signed)
Mailed after appt letter to pt. 

## 2012-10-05 ENCOUNTER — Encounter: Payer: Self-pay | Admitting: Oncology

## 2012-10-05 NOTE — Progress Notes (Signed)
Patient dropped off financial application. Called and left her a message to call me back. I need complete current month's bank statement. She had one page--it had the number 4 on it?? So we need 1-3 of those pages.

## 2012-10-06 ENCOUNTER — Ambulatory Visit (HOSPITAL_BASED_OUTPATIENT_CLINIC_OR_DEPARTMENT_OTHER): Payer: Managed Care, Other (non HMO) | Admitting: Oncology

## 2012-10-06 ENCOUNTER — Ambulatory Visit (HOSPITAL_BASED_OUTPATIENT_CLINIC_OR_DEPARTMENT_OTHER): Payer: Managed Care, Other (non HMO)

## 2012-10-06 ENCOUNTER — Encounter: Payer: Self-pay | Admitting: Oncology

## 2012-10-06 ENCOUNTER — Telehealth: Payer: Self-pay | Admitting: *Deleted

## 2012-10-06 ENCOUNTER — Other Ambulatory Visit (HOSPITAL_BASED_OUTPATIENT_CLINIC_OR_DEPARTMENT_OTHER): Payer: Managed Care, Other (non HMO) | Admitting: Lab

## 2012-10-06 VITALS — BP 136/84 | HR 80 | Temp 97.8°F | Resp 20 | Ht 66.0 in | Wt 203.3 lb

## 2012-10-06 DIAGNOSIS — Z5111 Encounter for antineoplastic chemotherapy: Secondary | ICD-10-CM

## 2012-10-06 DIAGNOSIS — C50919 Malignant neoplasm of unspecified site of unspecified female breast: Secondary | ICD-10-CM

## 2012-10-06 DIAGNOSIS — C50419 Malignant neoplasm of upper-outer quadrant of unspecified female breast: Secondary | ICD-10-CM

## 2012-10-06 DIAGNOSIS — C50912 Malignant neoplasm of unspecified site of left female breast: Secondary | ICD-10-CM

## 2012-10-06 DIAGNOSIS — Z170 Estrogen receptor positive status [ER+]: Secondary | ICD-10-CM

## 2012-10-06 LAB — CBC WITH DIFFERENTIAL/PLATELET
BASO%: 0.2 % (ref 0.0–2.0)
LYMPH%: 18.2 % (ref 14.0–49.7)
MCHC: 33.3 g/dL (ref 31.5–36.0)
MONO#: 0.9 10*3/uL (ref 0.1–0.9)
Platelets: 237 10*3/uL (ref 145–400)
RBC: 3.89 10*6/uL (ref 3.70–5.45)
WBC: 11.1 10*3/uL — ABNORMAL HIGH (ref 3.9–10.3)
lymph#: 2 10*3/uL (ref 0.9–3.3)

## 2012-10-06 LAB — CANCER ANTIGEN 27.29: CA 27.29: 24 U/mL (ref 0–39)

## 2012-10-06 MED ORDER — SODIUM CHLORIDE 0.9 % IV SOLN
600.0000 mg/m2 | Freq: Once | INTRAVENOUS | Status: AC
  Administered 2012-10-06: 1280 mg via INTRAVENOUS
  Filled 2012-10-06: qty 64

## 2012-10-06 MED ORDER — SODIUM CHLORIDE 0.9 % IV SOLN
Freq: Once | INTRAVENOUS | Status: AC
  Administered 2012-10-06: 15:00:00 via INTRAVENOUS

## 2012-10-06 MED ORDER — DOCETAXEL CHEMO INJECTION 160 MG/16ML
75.0000 mg/m2 | Freq: Once | INTRAVENOUS | Status: AC
  Administered 2012-10-06: 160 mg via INTRAVENOUS
  Filled 2012-10-06: qty 16

## 2012-10-06 MED ORDER — ONDANSETRON 16 MG/50ML IVPB (CHCC)
16.0000 mg | Freq: Once | INTRAVENOUS | Status: AC
  Administered 2012-10-06: 16 mg via INTRAVENOUS

## 2012-10-06 MED ORDER — DEXAMETHASONE SODIUM PHOSPHATE 4 MG/ML IJ SOLN
20.0000 mg | Freq: Once | INTRAMUSCULAR | Status: AC
  Administered 2012-10-06: 20 mg via INTRAVENOUS

## 2012-10-06 NOTE — Progress Notes (Signed)
Patient came by with complete bank statement. She qualifies for 95% discount 10/06/12-04/15/13. I will send her letter/green card.

## 2012-10-06 NOTE — Patient Instructions (Signed)
[facility] Discharge Instructions for Patients Receiving Chemotherapy  Today you received the following chemotherapy agents Taxotere and Cytoxan.  To help prevent nausea and vomiting after your treatment, we encourage you to take your nausea medication. Begin taking your nausea medication as often as prescribed for by Dr. Donnie Coffin.    If you develop nausea and vomiting that is not controlled by your nausea medication, call the clinic. If it is after clinic hours your family physician or the after hours number for the clinic or go to the Emergency Department.   BELOW ARE SYMPTOMS THAT SHOULD BE REPORTED IMMEDIATELY:  *FEVER GREATER THAN 100.5 F  *CHILLS WITH OR WITHOUT FEVER  NAUSEA AND VOMITING THAT IS NOT CONTROLLED WITH YOUR NAUSEA MEDICATION  *UNUSUAL SHORTNESS OF BREATH  *UNUSUAL BRUISING OR BLEEDING  TENDERNESS IN MOUTH AND THROAT WITH OR WITHOUT PRESENCE OF ULCERS  *URINARY PROBLEMS  *BOWEL PROBLEMS  UNUSUAL RASH Items with * indicate a potential emergency and should be followed up as soon as possible.  One of the nurses will contact you 24 hours after your treatment. Please let the nurse know about any problems that you may have experienced. Feel free to call the clinic you have any questions or concerns. The clinic phone number is [phone].   I have been informed and understand all the instructions given to me. I know to contact the clinic, my physician, or go to the Emergency Department if any problems should occur. I do not have any questions at this time, but understand that I may call the clinic during office hours   should I have any questions or need assistance in obtaining follow up care.    __________________________________________  _____________  __________ Signature of Patient or Authorized Representative            Date                   Time    __________________________________________ Nurse's Signature

## 2012-10-06 NOTE — Telephone Encounter (Signed)
Gave patient appointment for 10-16-2012 starting at 11:00am

## 2012-10-06 NOTE — Progress Notes (Signed)
Hematology and Oncology Follow Up Visit  Joann Page [id number] [birth year] 51 y.o. 10/06/2012 1:54 PM   DIAGNOSIS:  ER/PR positive breast cancer No diagnosis found.   PAST THERAPY:  Lumpectomy sentinel lymph node evaluation This is day 1 cycle 3 of TC chemotherapy  Interim History:  Patient returns for followup. General doing pretty well. No specific complaints today. She has feels pretty well she has no specific complaints. She denies any problems with her hands tearing nausea vomiting. Appetite fairly good. She feels somewhat weaker we get chemotherapy then recovers. Appetite is reduced. She is taking oral Geritol. Medications: I have reviewed the patient's current medications.  Allergies:  Allergies  Allergen Reactions  . Sulfa Antibiotics Shortness Of Breath and Swelling    Past Medical History, Surgical history, Social history, and Family History were reviewed and updated.  Review of Systems: Constitutional:  Negative for fever, chills, night sweats, anorexia, weight loss, pain. Cardiovascular: no chest pain or dyspnea on exertion Respiratory: no cough, shortness of breath, or wheezing Neurological: negative Dermatological: negative ENT: negative Skin Gastrointestinal: negative Genito-Urinary: negative Hematological and Lymphatic: negative Breast: negative Musculoskeletal: negative Remaining ROS negative.  Physical Exam:  Blood pressure 136/84, pulse 80, temperature 97.8 F (36.6 C), resp. rate 20, height 5\' 6"  (1.676 m), weight 203 lb 4.8 oz (92.216 kg).  ECOG: 0  HEENT:  Sclerae anicteric, conjunctivae pink.  Oropharynx clear.  No mucositis or candidiasis.  Nodes:  No cervical, supraclavicular, or axillary lymphadenopathy palpated. .  Clear to auscultation bilaterally.  No crackles, rhonchi, or wheezes.  Heart:  Regular rate and rhythm.  Abdomen:  Soft, nontender.  Positive bowel sounds.  No organomegaly or masses palpated.  Musculoskeletal:  No focal spinal  tenderness to palpation.  Extremities:  Benign.  No peripheral edema or cyanosis.  Skin:  Benign.  Neuro:  Nonfocal.     Lab Results: Lab Results  Component Value Date   WBC 11.1* 10/06/2012   HGB 11.4* 10/06/2012   HCT 34.4* 10/06/2012   MCV 88.4 10/06/2012   PLT 237 10/06/2012     Chemistry      Component Value Date/Time   NA 134* 08/30/2012 1349   NA 140 11/24/2011 1625   K 3.8 08/30/2012 1349   K 3.8 11/24/2011 1625   CL 102 08/30/2012 1349   CL 106 11/24/2011 1625   CO2 21* 08/30/2012 1349   CO2 26 11/24/2011 1625   BUN 13.0 08/30/2012 1349   BUN 9 11/24/2011 1625   CREATININE 0.9 08/30/2012 1349   CREATININE 0.79 11/24/2011 1625   CREATININE 0.72 05/02/2011 2018      Component Value Date/Time   CALCIUM 8.5 08/30/2012 1349   CALCIUM 9.0 11/24/2011 1625   ALKPHOS 64 08/30/2012 1349   ALKPHOS 43 11/24/2011 1625   AST 10 08/30/2012 1349   AST 16 11/24/2011 1625   ALT 16 08/30/2012 1349   ALT 19 11/24/2011 1625   BILITOT 0.80 08/30/2012 1349   BILITOT 0.2* 11/24/2011 1625       Radiological Studies:  No results found.   IMPRESSIONS AND PLAN: A 51 y.o. 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 Patient is doing fairly well. She'll return in 1 weeks for day 1  cycle 3. Her clinical exams unremarkable today. Her labs are within normal limits. I will see her return on 10/16/2012. 30 minutes spent with the patient half the time and patient-related counseling  Perl Kerney 11/8/20131:54 PM Cell [phone]

## 2012-10-07 ENCOUNTER — Ambulatory Visit (HOSPITAL_BASED_OUTPATIENT_CLINIC_OR_DEPARTMENT_OTHER): Payer: Managed Care, Other (non HMO)

## 2012-10-07 VITALS — BP 132/75 | HR 91 | Temp 97.5°F | Resp 16

## 2012-10-07 DIAGNOSIS — C50419 Malignant neoplasm of upper-outer quadrant of unspecified female breast: Secondary | ICD-10-CM

## 2012-10-07 DIAGNOSIS — Z5189 Encounter for other specified aftercare: Secondary | ICD-10-CM

## 2012-10-07 DIAGNOSIS — C50919 Malignant neoplasm of unspecified site of unspecified female breast: Secondary | ICD-10-CM

## 2012-10-07 MED ORDER — PEGFILGRASTIM INJECTION 6 MG/0.6ML
6.0000 mg | Freq: Once | SUBCUTANEOUS | Status: AC
  Administered 2012-10-07: 6 mg via SUBCUTANEOUS

## 2012-10-16 ENCOUNTER — Other Ambulatory Visit: Payer: Self-pay | Admitting: *Deleted

## 2012-10-16 ENCOUNTER — Telehealth: Payer: Self-pay | Admitting: *Deleted

## 2012-10-16 ENCOUNTER — Ambulatory Visit (HOSPITAL_BASED_OUTPATIENT_CLINIC_OR_DEPARTMENT_OTHER): Payer: Managed Care, Other (non HMO) | Admitting: Oncology

## 2012-10-16 ENCOUNTER — Other Ambulatory Visit: Payer: Managed Care, Other (non HMO) | Admitting: Lab

## 2012-10-16 VITALS — BP 111/77 | HR 83 | Temp 97.8°F | Resp 20 | Ht 66.0 in | Wt 197.8 lb

## 2012-10-16 DIAGNOSIS — C50419 Malignant neoplasm of upper-outer quadrant of unspecified female breast: Secondary | ICD-10-CM

## 2012-10-16 DIAGNOSIS — G8918 Other acute postprocedural pain: Secondary | ICD-10-CM

## 2012-10-16 DIAGNOSIS — C50919 Malignant neoplasm of unspecified site of unspecified female breast: Secondary | ICD-10-CM

## 2012-10-16 DIAGNOSIS — C50912 Malignant neoplasm of unspecified site of left female breast: Secondary | ICD-10-CM

## 2012-10-16 DIAGNOSIS — Z170 Estrogen receptor positive status [ER+]: Secondary | ICD-10-CM

## 2012-10-16 LAB — CBC WITH DIFFERENTIAL/PLATELET
BASO%: 0.2 % (ref 0.0–2.0)
Eosinophils Absolute: 0 10*3/uL (ref 0.0–0.5)
MCHC: 33.1 g/dL (ref 31.5–36.0)
MONO#: 1.1 10*3/uL — ABNORMAL HIGH (ref 0.1–0.9)
NEUT#: 16.4 10*3/uL — ABNORMAL HIGH (ref 1.5–6.5)
RBC: 3.72 10*6/uL (ref 3.70–5.45)
WBC: 19.1 10*3/uL — ABNORMAL HIGH (ref 3.9–10.3)
lymph#: 1.5 10*3/uL (ref 0.9–3.3)

## 2012-10-16 MED ORDER — HYDROCODONE-ACETAMINOPHEN 5-325 MG PO TABS: 1.0000 | ORAL_TABLET | ORAL | Status: DC | PRN

## 2012-10-16 NOTE — Telephone Encounter (Signed)
Per staff message and POF I have scheduled appts.  JMW  

## 2012-10-16 NOTE — Telephone Encounter (Signed)
Made patient appointment for dr.squire in 11-07-2012 at 10:00am

## 2012-10-16 NOTE — Progress Notes (Signed)
Hematology and Oncology Follow Up Visit  Joann Page [id number] [birth year] 51 y.o. 10/16/2012 12:11 PM   DIAGNOSIS:  ER/PR positive breast cancer No diagnosis found.   PAST THERAPY:  Lumpectomy sentinel lymph node evaluation This is day 7 cycle 3 of TC chemotherapy  Interim History:  Patient returns for followup. General doing pretty well. No specific complaints today. She has feels pretty well she has no specific complaints. She denies any problems with her hands tearing nausea  Or vomiting. Appetite fairly good. She feels somewhat weaker we get chemotherapy then recovers. Appetite is reduced. She is taking oral Geritol. Medications: I have reviewed the patient's current medications.  Allergies:  Allergies  Allergen Reactions  . Sulfa Antibiotics Shortness Of Breath and Swelling    Past Medical History, Surgical history, Social history, and Family History were reviewed and updated.  Review of Systems: Constitutional:  Negative for fever, chills, night sweats, anorexia, weight loss, pain. Cardiovascular: no chest pain or dyspnea on exertion Respiratory: no cough, shortness of breath, or wheezing Neurological: negative Dermatological: negative ENT: negative Skin Gastrointestinal: negative Genito-Urinary: negative Hematological and Lymphatic: negative Breast: negative Musculoskeletal: negative Remaining ROS negative.  Physical Exam:  Blood pressure 111/77, pulse 83, temperature 97.8 F (36.6 C), temperature source Oral, resp. rate 20, height 5\' 6"  (1.676 m), weight 197 lb 12.8 oz (89.721 kg).  ECOG: 0  HEENT:  Sclerae anicteric, conjunctivae pink.  Oropharynx clear.  No mucositis or candidiasis.  Nodes:  No cervical, supraclavicular, or axillary lymphadenopathy palpated. .  Clear to auscultation bilaterally.  No crackles, rhonchi, or wheezes.  Heart:  Regular rate and rhythm.  Abdomen:  Soft, nontender.  Positive bowel sounds.  No organomegaly or masses palpated.   Musculoskeletal:  No focal spinal tenderness to palpation.  Extremities:  Benign.  No peripheral edema or cyanosis.  Skin:  Benign.  Neuro:  Nonfocal.     Lab Results: Lab Results  Component Value Date   WBC 19.1* 10/16/2012   HGB 11.0* 10/16/2012   HCT 33.2* 10/16/2012   MCV 89.2 10/16/2012   PLT 190 10/16/2012     Chemistry      Component Value Date/Time   NA 134* 08/30/2012 1349   NA 140 11/24/2011 1625   K 3.8 08/30/2012 1349   K 3.8 11/24/2011 1625   CL 102 08/30/2012 1349   CL 106 11/24/2011 1625   CO2 21* 08/30/2012 1349   CO2 26 11/24/2011 1625   BUN 13.0 08/30/2012 1349   BUN 9 11/24/2011 1625   CREATININE 0.9 08/30/2012 1349   CREATININE 0.79 11/24/2011 1625   CREATININE 0.72 05/02/2011 2018      Component Value Date/Time   CALCIUM 8.5 08/30/2012 1349   CALCIUM 9.0 11/24/2011 1625   ALKPHOS 64 08/30/2012 1349   ALKPHOS 43 11/24/2011 1625   AST 10 08/30/2012 1349   AST 16 11/24/2011 1625   ALT 16 08/30/2012 1349   ALT 19 11/24/2011 1625   BILITOT 0.80 08/30/2012 1349   BILITOT 0.2* 11/24/2011 1625       Radiological Studies:  No results found.   IMPRESSIONS AND PLAN: A 51 y.o. 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 Patient is doing fairly well.  I will see her on the 29th which is her last cycle of TC chemotherapy. I will refer to see Dr. Karoline Caldwell in radiation/04 she can start radiation sometime in December. She is ER/PR positive and ultimately were will take  AI therapy.     Beverley Allender 11/18/201312:11 PM Cell [phone]

## 2012-10-24 ENCOUNTER — Other Ambulatory Visit: Payer: Self-pay | Admitting: *Deleted

## 2012-10-24 DIAGNOSIS — C50912 Malignant neoplasm of unspecified site of left female breast: Secondary | ICD-10-CM

## 2012-10-27 ENCOUNTER — Ambulatory Visit: Payer: Managed Care, Other (non HMO) | Admitting: Oncology

## 2012-10-27 ENCOUNTER — Ambulatory Visit (HOSPITAL_BASED_OUTPATIENT_CLINIC_OR_DEPARTMENT_OTHER): Payer: Managed Care, Other (non HMO)

## 2012-10-27 ENCOUNTER — Telehealth: Payer: Self-pay | Admitting: *Deleted

## 2012-10-27 ENCOUNTER — Other Ambulatory Visit (HOSPITAL_BASED_OUTPATIENT_CLINIC_OR_DEPARTMENT_OTHER): Payer: Managed Care, Other (non HMO) | Admitting: Lab

## 2012-10-27 ENCOUNTER — Other Ambulatory Visit: Payer: Self-pay | Admitting: *Deleted

## 2012-10-27 ENCOUNTER — Ambulatory Visit (HOSPITAL_BASED_OUTPATIENT_CLINIC_OR_DEPARTMENT_OTHER): Payer: Managed Care, Other (non HMO) | Admitting: Oncology

## 2012-10-27 VITALS — BP 127/84 | HR 69 | Temp 98.2°F | Resp 20 | Ht 66.0 in | Wt 206.2 lb

## 2012-10-27 DIAGNOSIS — C50919 Malignant neoplasm of unspecified site of unspecified female breast: Secondary | ICD-10-CM

## 2012-10-27 DIAGNOSIS — C50419 Malignant neoplasm of upper-outer quadrant of unspecified female breast: Secondary | ICD-10-CM

## 2012-10-27 DIAGNOSIS — C50912 Malignant neoplasm of unspecified site of left female breast: Secondary | ICD-10-CM

## 2012-10-27 DIAGNOSIS — Z5111 Encounter for antineoplastic chemotherapy: Secondary | ICD-10-CM

## 2012-10-27 DIAGNOSIS — Z170 Estrogen receptor positive status [ER+]: Secondary | ICD-10-CM

## 2012-10-27 LAB — CBC WITH DIFFERENTIAL/PLATELET
Basophils Absolute: 0 10*3/uL (ref 0.0–0.1)
EOS%: 0 % (ref 0.0–7.0)
Eosinophils Absolute: 0 10*3/uL (ref 0.0–0.5)
HGB: 10.8 g/dL — ABNORMAL LOW (ref 11.6–15.9)
MCV: 89.8 fL (ref 79.5–101.0)
MONO%: 9.1 % (ref 0.0–14.0)
NEUT#: 6.5 10*3/uL (ref 1.5–6.5)
RBC: 3.73 10*6/uL (ref 3.70–5.45)
RDW: 15.2 % — ABNORMAL HIGH (ref 11.2–14.5)
lymph#: 0.9 10*3/uL (ref 0.9–3.3)
nRBC: 0 % (ref 0–0)

## 2012-10-27 LAB — COMPREHENSIVE METABOLIC PANEL (CC13)
AST: 31 U/L (ref 5–34)
Albumin: 3.5 g/dL (ref 3.5–5.0)
Alkaline Phosphatase: 58 U/L (ref 40–150)
Potassium: 4.2 mEq/L (ref 3.5–5.1)
Sodium: 140 mEq/L (ref 136–145)
Total Bilirubin: 0.29 mg/dL (ref 0.20–1.20)
Total Protein: 6.3 g/dL — ABNORMAL LOW (ref 6.4–8.3)

## 2012-10-27 MED ORDER — DEXAMETHASONE SODIUM PHOSPHATE 4 MG/ML IJ SOLN
20.0000 mg | Freq: Once | INTRAMUSCULAR | Status: AC
  Administered 2012-10-27: 20 mg via INTRAVENOUS

## 2012-10-27 MED ORDER — ONDANSETRON 16 MG/50ML IVPB (CHCC)
16.0000 mg | Freq: Once | INTRAVENOUS | Status: AC
  Administered 2012-10-27: 16 mg via INTRAVENOUS

## 2012-10-27 MED ORDER — SODIUM CHLORIDE 0.9 % IV SOLN
Freq: Once | INTRAVENOUS | Status: AC
  Administered 2012-10-27: 11:00:00 via INTRAVENOUS

## 2012-10-27 MED ORDER — SODIUM CHLORIDE 0.9 % IV SOLN
600.0000 mg/m2 | Freq: Once | INTRAVENOUS | Status: AC
  Administered 2012-10-27: 1280 mg via INTRAVENOUS
  Filled 2012-10-27: qty 64

## 2012-10-27 MED ORDER — DOCETAXEL CHEMO INJECTION 160 MG/16ML
75.0000 mg/m2 | Freq: Once | INTRAVENOUS | Status: AC
  Administered 2012-10-27: 160 mg via INTRAVENOUS
  Filled 2012-10-27: qty 16

## 2012-10-27 NOTE — Telephone Encounter (Signed)
Gave patient appointment for 01-10-2013 starting at 10:00

## 2012-10-27 NOTE — Progress Notes (Signed)
Hematology and Oncology Follow Up Visit  Joann Page [id number] 1960/12/12 51 y.o. 10/27/2012 10:48 AM   DIAGNOSIS:  ER/PR positive breast cancer No diagnosis found.   PAST THERAPY:  Lumpectomy sentinel lymph node evaluation This is day 1 cycle 4 of TC chemotherapy  Interim History:  Patient returns for followup. General doing pretty well. No specific complaints today. She has feels pretty well she has no specific complaints. She denies any problems with her hands tearing nausea  Or vomiting. Appetite fairly good. She feels somewhat weaker we get chemotherapy then recovers. Appetite is reduced. She is taking oral Geritol. Medications: I have reviewed the patient's current medications.  Allergies:  Allergies  Allergen Reactions  . Sulfa Antibiotics Shortness Of Breath and Swelling    Past Medical History, Surgical history, Social history, and Family History were reviewed and updated.  Review of Systems: Constitutional:  Negative for fever, chills, night sweats, anorexia, weight loss, pain. Cardiovascular: no chest pain or dyspnea on exertion Respiratory: no cough, shortness of breath, or wheezing Neurological: negative Dermatological: negative ENT: negative Skin Gastrointestinal: negative Genito-Urinary: negative Hematological and Lymphatic: negative Breast: negative Musculoskeletal: negative Remaining ROS negative.  Physical Exam:  Blood pressure 127/84, pulse 69, temperature 98.2 F (36.8 C), temperature source Oral, resp. rate 20, height 5\' 6"  (1.676 m), weight 206 lb 3.2 oz (93.532 kg).  ECOG: 0  HEENT:  Sclerae anicteric, conjunctivae pink.  Oropharynx clear.  No mucositis or candidiasis.  Nodes:  No cervical, supraclavicular, or axillary lymphadenopathy palpated. .  Clear to auscultation bilaterally.  No crackles, rhonchi, or wheezes.  Heart:  Regular rate and rhythm.  Abdomen:  Soft, nontender.  Positive bowel sounds.  No organomegaly or masses palpated.   Musculoskeletal:  No focal spinal tenderness to palpation.  Extremities:  Benign.  No peripheral edema or cyanosis.  Skin:  Benign.  Neuro:  Nonfocal.     Lab Results: Lab Results  Component Value Date   WBC 8.2 10/27/2012   HGB 10.8* 10/27/2012   HCT 33.5* 10/27/2012   MCV 89.8 10/27/2012   PLT 241 10/27/2012     Chemistry      Component Value Date/Time   NA 134* 08/30/2012 1349   NA 140 11/24/2011 1625   K 3.8 08/30/2012 1349   K 3.8 11/24/2011 1625   CL 102 08/30/2012 1349   CL 106 11/24/2011 1625   CO2 21* 08/30/2012 1349   CO2 26 11/24/2011 1625   BUN 13.0 08/30/2012 1349   BUN 9 11/24/2011 1625   CREATININE 0.9 08/30/2012 1349   CREATININE 0.79 11/24/2011 1625   CREATININE 0.72 05/02/2011 2018      Component Value Date/Time   CALCIUM 8.5 08/30/2012 1349   CALCIUM 9.0 11/24/2011 1625   ALKPHOS 64 08/30/2012 1349   ALKPHOS 43 11/24/2011 1625   AST 10 08/30/2012 1349   AST 16 11/24/2011 1625   ALT 16 08/30/2012 1349   ALT 19 11/24/2011 1625   BILITOT 0.80 08/30/2012 1349   BILITOT 0.2* 11/24/2011 1625       Radiological Studies:  No results found.   IMPRESSIONS AND PLAN: A 51 y.o. 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 Patient is doing fairly well.  She wuill complete chemo today, she will see rad onc on 12/10. i will see her in f/u in early feb/14 to discuss AI therapy.    Joann Page 11/29/201310:48 AM Cell [phone]

## 2012-10-27 NOTE — Patient Instructions (Addendum)
 Cancer Center Discharge Instructions for Patients Receiving Chemotherapy  Today you received the following chemotherapy agents Taxotere and Cytoxan  To help prevent nausea and vomiting after your treatment, we encourage you to take your nausea medication as prescribed.   If you develop nausea and vomiting that is not controlled by your nausea medication, call the clinic. If it is after clinic hours your family physician or the after hours number for the clinic or go to the Emergency Department.   BELOW ARE SYMPTOMS THAT SHOULD BE REPORTED IMMEDIATELY:  *FEVER GREATER THAN 100.5 F  *CHILLS WITH OR WITHOUT FEVER  NAUSEA AND VOMITING THAT IS NOT CONTROLLED WITH YOUR NAUSEA MEDICATION  *UNUSUAL SHORTNESS OF BREATH  *UNUSUAL BRUISING OR BLEEDING  TENDERNESS IN MOUTH AND THROAT WITH OR WITHOUT PRESENCE OF ULCERS  *URINARY PROBLEMS  *BOWEL PROBLEMS  UNUSUAL RASH Items with * indicate a potential emergency and should be followed up as soon as possible.  Feel free to call the clinic you have any questions or concerns. The clinic phone number is [phone].   I have been informed and understand all the instructions given to me. I know to contact the clinic, my physician, or go to the Emergency Department if any problems should occur. I do not have any questions at this time, but understand that I may call the clinic during office hours   should I have any questions or need assistance in obtaining follow up care.    __________________________________________  _____________  __________ Signature of Patient or Authorized Representative            Date                   Time    __________________________________________ Nurse's Signature

## 2012-10-27 NOTE — Telephone Encounter (Signed)
Patient confirmed over the phone the new date and time for the lab only appointment

## 2012-10-28 ENCOUNTER — Ambulatory Visit (HOSPITAL_BASED_OUTPATIENT_CLINIC_OR_DEPARTMENT_OTHER): Payer: Managed Care, Other (non HMO)

## 2012-10-28 VITALS — BP 121/83 | HR 74 | Temp 97.3°F | Resp 20

## 2012-10-28 DIAGNOSIS — Z800 Family history of malignant neoplasm of digestive organs: Secondary | ICD-10-CM

## 2012-10-28 DIAGNOSIS — Z5189 Encounter for other specified aftercare: Secondary | ICD-10-CM

## 2012-10-28 DIAGNOSIS — C50919 Malignant neoplasm of unspecified site of unspecified female breast: Secondary | ICD-10-CM

## 2012-10-28 DIAGNOSIS — C50419 Malignant neoplasm of upper-outer quadrant of unspecified female breast: Secondary | ICD-10-CM

## 2012-10-28 DIAGNOSIS — C50912 Malignant neoplasm of unspecified site of left female breast: Secondary | ICD-10-CM

## 2012-10-28 MED ORDER — PEGFILGRASTIM INJECTION 6 MG/0.6ML
6.0000 mg | Freq: Once | SUBCUTANEOUS | Status: AC
  Administered 2012-10-28: 6 mg via SUBCUTANEOUS

## 2012-11-03 ENCOUNTER — Other Ambulatory Visit (HOSPITAL_BASED_OUTPATIENT_CLINIC_OR_DEPARTMENT_OTHER): Payer: Managed Care, Other (non HMO)

## 2012-11-03 DIAGNOSIS — C50912 Malignant neoplasm of unspecified site of left female breast: Secondary | ICD-10-CM

## 2012-11-03 DIAGNOSIS — C50919 Malignant neoplasm of unspecified site of unspecified female breast: Secondary | ICD-10-CM

## 2012-11-03 LAB — CBC WITH DIFFERENTIAL/PLATELET
BASO%: 0.7 % (ref 0.0–2.0)
Basophils Absolute: 0 10*3/uL (ref 0.0–0.1)
EOS%: 0.3 % (ref 0.0–7.0)
Eosinophils Absolute: 0 10*3/uL (ref 0.0–0.5)
HCT: 31.6 % — ABNORMAL LOW (ref 34.8–46.6)
HGB: 10.5 g/dL — ABNORMAL LOW (ref 11.6–15.9)
LYMPH%: 23.6 % (ref 14.0–49.7)
MCH: 29.9 pg (ref 25.1–34.0)
MCHC: 33.2 g/dL (ref 31.5–36.0)
MCV: 90.1 fL (ref 79.5–101.0)
MONO#: 0.8 10*3/uL (ref 0.1–0.9)
MONO%: 16.7 % — ABNORMAL HIGH (ref 0.0–14.0)
NEUT#: 3 10*3/uL (ref 1.5–6.5)
NEUT%: 58.7 % (ref 38.4–76.8)
Platelets: 187 10*3/uL (ref 145–400)
RBC: 3.51 10*6/uL — ABNORMAL LOW (ref 3.70–5.45)
RDW: 15.2 % — ABNORMAL HIGH (ref 11.2–14.5)
WBC: 5.1 10*3/uL (ref 3.9–10.3)
lymph#: 1.2 10*3/uL (ref 0.9–3.3)

## 2012-11-07 ENCOUNTER — Ambulatory Visit: Payer: Managed Care, Other (non HMO)

## 2012-11-07 ENCOUNTER — Ambulatory Visit: Payer: Managed Care, Other (non HMO) | Admitting: Radiation Oncology

## 2012-11-13 ENCOUNTER — Telehealth: Payer: Self-pay | Admitting: Oncology

## 2012-11-13 ENCOUNTER — Encounter: Payer: Self-pay | Admitting: Radiation Oncology

## 2012-11-13 NOTE — Telephone Encounter (Signed)
Pt called asking if she could register for [facility].   She stated she is really having a hard time- she thinks about this diagnosis constantly and is very worried although she knows she is an early stage.  She has not talked to a survivor at all- and feels very fearful.   I will refer her to [facility] that starts in Feb- and refer to [facility] for brief counseling and refer to reach to recovery for support.   She was very Adult nurse.  I will also pass this onto her radiation team as that is her next treatment.  Pt will call if she has more questions or concerns and expressed gratitude for these support services.

## 2012-11-14 ENCOUNTER — Encounter: Payer: Self-pay | Admitting: Specialist

## 2012-11-14 ENCOUNTER — Ambulatory Visit
Admission: RE | Admit: 2012-11-14 | Discharge: 2012-11-14 | Disposition: A | Discharge status: Home / Self Care | Payer: Managed Care, Other (non HMO) | Source: Ambulatory Visit | Attending: Radiation Oncology | Admitting: Radiation Oncology

## 2012-11-14 ENCOUNTER — Encounter: Payer: Self-pay | Admitting: Radiation Oncology

## 2012-11-14 VITALS — BP 118/81 | HR 97 | Temp 98.1°F | Resp 20 | Wt 208.0 lb

## 2012-11-14 DIAGNOSIS — J45909 Unspecified asthma, uncomplicated: Secondary | ICD-10-CM | POA: Insufficient documentation

## 2012-11-14 DIAGNOSIS — C50419 Malignant neoplasm of upper-outer quadrant of unspecified female breast: Secondary | ICD-10-CM

## 2012-11-14 DIAGNOSIS — M1990 Unspecified osteoarthritis, unspecified site: Secondary | ICD-10-CM | POA: Insufficient documentation

## 2012-11-14 DIAGNOSIS — K219 Gastro-esophageal reflux disease without esophagitis: Secondary | ICD-10-CM | POA: Insufficient documentation

## 2012-11-14 HISTORY — DX: Personal history of antineoplastic chemotherapy: Z92.21

## 2012-11-14 NOTE — Progress Notes (Signed)
At Dr. Colletta Maryland request, I called the patient to discuss her emotional well-being, as well as her up-coming radiation treatment, specifically her degree of being comfortable with the breath hold technique.  She and I will meet on December 31 when she is here for another appointment.  She is very interested in participating in the [facility] program that begins in mid-February.

## 2012-11-14 NOTE — Addendum Note (Signed)
Encounter addended by: Delynn Flavin, RN on: 11/14/2012  5:41 PM<BR>     Documentation filed: Charges VN

## 2012-11-14 NOTE — Progress Notes (Signed)
Please see the Nurse Progress Note in the MD Initial Consult Encounter for this patient. 

## 2012-11-14 NOTE — Progress Notes (Signed)
Pt completed chemo 10/27/12. She c/o weakness, loss of appetite, but denies fatigue.

## 2012-11-14 NOTE — Progress Notes (Signed)
Radiation Oncology         [phone] ________________________________  Name: Joann Page MRN: [id number]  Date: 11/14/2012  DOB: [birth year]  Follow-Up Visit Note  Outpatient  CC: Miguel Aschoff, MD  Pierce Crane, MD  Diagnosis:   [id number] ER/PR positive grade 3 left upper outer quadrant breast cancer  Narrative:  The patient returns today for routine follow-up. She is status post 4 cycles of TC chemotherapy, completed last infusion on 10/27/2012.     She tolerated this well.      She does report that after a couple cycles of chemotherapy, she began to feel more emotionally fragile, and tearful at times. Our breast navigator has made a referral to social work and the finding your new normal class. I have left a voice mail for our chaplain and look forward to speaking with her about the patient's emotional needs.  ALLERGIES:  is allergic to sulfa antibiotics.  Meds: Current Outpatient Prescriptions  Medication Sig Dispense Refill  . HYDROcodone-acetaminophen (NORCO/VICODIN) 5-325 MG per tablet Take 1 tablet by mouth every 4 (four) hours as needed (4- 6 hours prn). pain  30 tablet  1  . ibuprofen (ADVIL,MOTRIN) 200 MG tablet Take 400 mg by mouth every 6 (six) hours as needed. pain      . LORazepam (ATIVAN) 0.5 MG tablet Take 1 tablet (0.5 mg total) by mouth every 6 (six) hours as needed (Nausea or vomiting).  30 tablet  1  . ondansetron (ZOFRAN) 8 MG tablet Take 1 tablet two times a day starting the day after chemo for 3 days. Then take 1 tab two times a day as needed for nausea or vomiting.  30 tablet  1  . prochlorperazine (COMPAZINE) 10 MG tablet Take 1 tablet (10 mg total) by mouth every 6 (six) hours as needed (Nausea or vomiting).  30 tablet  1  . prochlorperazine (COMPAZINE) 25 MG suppository Place 1 suppository (25 mg total) rectally every 12 (twelve) hours as needed for nausea.  12 suppository  3  . ranitidine (ZANTAC) 150 MG tablet Take 150 mg by mouth daily as needed.          Physical Findings: The patient is in no acute distress. Patient is alert and oriented.  weight is 208 lb (94.348 kg). Her oral temperature is 98.1 F (36.7 C). Her blood pressure is 118/81 and her pulse is 97. Her respiration is 20. Marland Kitchen   She is a little nervous and tearful. Alopecia. Left breast shows no palpable seroma underneath the lumpectomy scar.  Lab Findings: Lab Results  Component Value Date   WBC 5.1 11/03/2012   HGB 10.5* 11/03/2012   HCT 31.6* 11/03/2012   MCV 90.1 11/03/2012   PLT 187 11/03/2012    CMP     Component Value Date/Time   NA 140 10/27/2012 0937   NA 140 11/24/2011 1625   K 4.2 10/27/2012 0937   K 3.8 11/24/2011 1625   CL 107 10/27/2012 0937   CL 106 11/24/2011 1625   CO2 24 10/27/2012 0937   CO2 26 11/24/2011 1625   GLUCOSE 102* 10/27/2012 0937   GLUCOSE 112* 11/24/2011 1625   BUN 11.0 10/27/2012 0937   BUN 9 11/24/2011 1625   CREATININE 0.7 10/27/2012 0937   CREATININE 0.79 11/24/2011 1625   CREATININE 0.72 05/02/2011 2018   CALCIUM 9.3 10/27/2012 0937   CALCIUM 9.0 11/24/2011 1625   PROT 6.3* 10/27/2012 0937   PROT 6.8 11/24/2011 1625   ALBUMIN 3.5 10/27/2012 4098  ALBUMIN 4.1 11/24/2011 1625   AST 31 10/27/2012 0937   AST 16 11/24/2011 1625   ALT 37 10/27/2012 0937   ALT 19 11/24/2011 1625   ALKPHOS 58 10/27/2012 0937   ALKPHOS 43 11/24/2011 1625   BILITOT 0.29 10/27/2012 0937   BILITOT 0.2* 11/24/2011 1625   GFRNONAA >60 05/02/2011 2018   GFRAA  Value: >60        The eGFR has been calculated using the MDRD equation. This calculation has not been validated in all clinical situations. eGFR's persistently <60 mL/min signify possible Chronic Kidney Disease. 05/02/2011 2018     Radiographic Findings: No results found.  Impression/Plan: We once again reviewed the risks benefits and side effects of whole breast radiotherapy. Side effects may include skin irritation and fatigue in the acute setting, and rare irritation of the heart and/ or lung  in the late setting. However, we may use the breath-hold technique to adequately block her heart from radiotherapy, if necessary. We talked about cosmetic changes in the breast that can result from radiotherapy. She is enthusiastic about proceeding with treatment. A consent form has been signed and placed in her chart. We will plan her treatment today. Due to a holiday vacation she has planned, we will  obtain confirmatory x-rays on December 31 and start her treatment on January 2.   _____________________________________   Lonie Peak, MD

## 2012-11-14 NOTE — Progress Notes (Signed)
Simulation / Treatment Planning Note  The patient has a diagnosis of  Left Breast Cancer- she is status post lumpectomy. She will receive whole breast radiotherapy. The patient was laid in the supine position on the treatment table with her arms over her head. Her head was in an Accuform device. I placed adhesive wiring over her lumpectomy scar and around the borders of her breast tissue . High-resolution CT axial imaging was obtained of the patient's chest. An isocenter was placed in her anterior left lung. Skin markings were made and she tolerated the procedure well without any complications.  The patient was then scanned in the CT simulator while holding her breath. I verified with our physicist that this increased the distance between her heart and chest wall. We will treat her with the breath-hold technique and base her planning on the breath-hold images.   Treatment planning note: the patient will be treated with opposed tangential fields using MLCs for custom blocks. I plan to prescribe 50 Gray in 25 fractions to the whole breast.   This will eventually be followed by a boost of 10 Gy in 5 fractions to the lumpectomy cavity.  -----------------------------------  Lonie Peak, MD

## 2012-11-17 NOTE — Addendum Note (Signed)
Encounter addended by: Maleta Pacha Mintz Waylon Koffler, RN on: 11/17/2012  6:18 PM<BR>     Documentation filed: Charges VN

## 2012-11-20 NOTE — Addendum Note (Signed)
Encounter addended by: Delynn Flavin, RN on: 11/20/2012  9:33 AM<BR>     Documentation filed: Charges VN

## 2012-11-21 ENCOUNTER — Ambulatory Visit: Payer: Managed Care, Other (non HMO)

## 2012-11-23 ENCOUNTER — Ambulatory Visit: Payer: Managed Care, Other (non HMO)

## 2012-11-24 ENCOUNTER — Ambulatory Visit: Payer: Managed Care, Other (non HMO)

## 2012-11-27 ENCOUNTER — Ambulatory Visit: Payer: Managed Care, Other (non HMO)

## 2012-11-28 ENCOUNTER — Encounter: Payer: Self-pay | Admitting: Radiation Oncology

## 2012-11-28 ENCOUNTER — Ambulatory Visit: Payer: Managed Care, Other (non HMO)

## 2012-11-28 ENCOUNTER — Ambulatory Visit
Admission: RE | Admit: 2012-11-28 | Discharge: 2012-11-28 | Disposition: A | Discharge status: Home / Self Care | Payer: Managed Care, Other (non HMO) | Source: Ambulatory Visit | Attending: Radiation Oncology | Admitting: Radiation Oncology

## 2012-11-30 ENCOUNTER — Encounter: Payer: Self-pay | Admitting: Radiation Oncology

## 2012-11-30 ENCOUNTER — Ambulatory Visit
Admission: RE | Admit: 2012-11-30 | Discharge: 2012-11-30 | Disposition: A | Discharge status: Home / Self Care | Payer: Managed Care, Other (non HMO) | Source: Ambulatory Visit | Attending: Radiation Oncology | Admitting: Radiation Oncology

## 2012-11-30 ENCOUNTER — Ambulatory Visit: Payer: Managed Care, Other (non HMO)

## 2012-11-30 NOTE — Progress Notes (Signed)
Simulation verification note: The patient underwent simulation verification for treatment to her left breast. Her isocenter is in good position and the multileaf collimators contoured the treatment volume appropriately. This was performed on 11/28/2012.

## 2012-12-01 ENCOUNTER — Ambulatory Visit
Admission: RE | Admit: 2012-12-01 | Discharge: 2012-12-01 | Disposition: A | Discharge status: Home / Self Care | Payer: Managed Care, Other (non HMO) | Source: Ambulatory Visit | Attending: Radiation Oncology | Admitting: Radiation Oncology

## 2012-12-01 ENCOUNTER — Ambulatory Visit: Payer: Managed Care, Other (non HMO)

## 2012-12-04 ENCOUNTER — Ambulatory Visit
Admission: RE | Admit: 2012-12-04 | Discharge: 2012-12-04 | Disposition: A | Discharge status: Home / Self Care | Payer: Managed Care, Other (non HMO) | Source: Ambulatory Visit | Attending: Radiation Oncology | Admitting: Radiation Oncology

## 2012-12-04 ENCOUNTER — Encounter: Payer: Self-pay | Admitting: Radiation Oncology

## 2012-12-04 ENCOUNTER — Ambulatory Visit: Payer: Managed Care, Other (non HMO)

## 2012-12-04 VITALS — BP 116/76 | HR 83 | Temp 97.7°F | Wt 202.0 lb

## 2012-12-04 DIAGNOSIS — C50419 Malignant neoplasm of upper-outer quadrant of unspecified female breast: Secondary | ICD-10-CM

## 2012-12-04 MED ORDER — ALRA NON-METALLIC DEODORANT (RAD-ONC)
1.0000 "application " | Freq: Once | TOPICAL | Status: AC
  Administered 2012-12-04: 1 via TOPICAL

## 2012-12-04 MED ORDER — RADIAPLEXRX EX GEL
Freq: Once | CUTANEOUS | Status: AC
  Administered 2012-12-04: 18:00:00 via TOPICAL

## 2012-12-04 NOTE — Progress Notes (Signed)
   Weekly Management Note:  Outpatient Current Dose:  6 Gy  Projected Dose: 60 Gy   Narrative:  The patient presents for routine under treatment assessment.  CBCT/MVCT images/Port film x-rays were reviewed.  The chart was checked. No complaints as far. She has joined a support group with other cancer patients on Thursday evenings  Physical Findings:  weight is 202 lb (91.627 kg). Her temperature is 97.7 F (36.5 C). Her blood pressure is 116/76 and her pulse is 83.  skin exam deferred today.  Impression:  The patient is tolerating radiotherapy.  Plan:  Continue radiotherapy as planned.  ________________________________   Lonie Peak, M.D.

## 2012-12-04 NOTE — Addendum Note (Signed)
Encounter addended by: Delynn Flavin, RN on: 12/04/2012  5:47 PM<BR>     Documentation filed: Inpatient MAR, Orders

## 2012-12-05 ENCOUNTER — Ambulatory Visit: Payer: Managed Care, Other (non HMO)

## 2012-12-05 ENCOUNTER — Ambulatory Visit
Admission: RE | Admit: 2012-12-05 | Discharge: 2012-12-05 | Disposition: A | Discharge status: Home / Self Care | Payer: Managed Care, Other (non HMO) | Source: Ambulatory Visit | Attending: Radiation Oncology | Admitting: Radiation Oncology

## 2012-12-06 ENCOUNTER — Ambulatory Visit
Admission: RE | Admit: 2012-12-06 | Discharge: 2012-12-06 | Disposition: A | Discharge status: Home / Self Care | Payer: Managed Care, Other (non HMO) | Source: Ambulatory Visit | Attending: Radiation Oncology | Admitting: Radiation Oncology

## 2012-12-06 ENCOUNTER — Ambulatory Visit: Payer: Managed Care, Other (non HMO)

## 2012-12-07 ENCOUNTER — Ambulatory Visit
Admission: RE | Admit: 2012-12-07 | Discharge: 2012-12-07 | Disposition: A | Discharge status: Home / Self Care | Payer: Managed Care, Other (non HMO) | Source: Ambulatory Visit | Attending: Radiation Oncology | Admitting: Radiation Oncology

## 2012-12-07 ENCOUNTER — Ambulatory Visit: Payer: Managed Care, Other (non HMO)

## 2012-12-08 ENCOUNTER — Ambulatory Visit
Admission: RE | Admit: 2012-12-08 | Discharge: 2012-12-08 | Disposition: A | Discharge status: Home / Self Care | Payer: Managed Care, Other (non HMO) | Source: Ambulatory Visit | Attending: Radiation Oncology | Admitting: Radiation Oncology

## 2012-12-08 ENCOUNTER — Ambulatory Visit: Payer: Managed Care, Other (non HMO)

## 2012-12-08 NOTE — Progress Notes (Signed)
  Radiation Oncology         [phone] ________________________________  Name: Joann Page MRN: [id number]  Date: 11/28/2012  DOB: Jan 08, 1961  Simulation Verification Note  Status: outpatient  NARRATIVE: The patient was brought to the treatment unit and placed in the planned treatment position. The clinical setup was verified. Then port films were obtained and uploaded to the radiation oncology medical record software.  The treatment beams were carefully compared against the planned radiation fields. The position location and shape of the radiation fields was reviewed. They targeted volume of tissue appears to be appropriately covered by the radiation beams. Organs at risk appear to be excluded as planned.  Based on my personal review, I approved the simulation verification. The patient's treatment will proceed as planned.  ------------------------------------------------  Joann Page, M.D.

## 2012-12-11 ENCOUNTER — Ambulatory Visit
Admission: RE | Admit: 2012-12-11 | Discharge: 2012-12-11 | Disposition: A | Discharge status: Home / Self Care | Payer: Managed Care, Other (non HMO) | Source: Ambulatory Visit | Attending: Radiation Oncology | Admitting: Radiation Oncology

## 2012-12-11 ENCOUNTER — Telehealth: Payer: Self-pay | Admitting: Emergency Medicine

## 2012-12-11 ENCOUNTER — Ambulatory Visit: Payer: Managed Care, Other (non HMO)

## 2012-12-11 NOTE — Telephone Encounter (Signed)
Spoke with patient and given her new appointment date and times for 2/20 at 2:30 for labs and 3:00 for Dr Welton Flakes.  Patient verbalized understanding.

## 2012-12-12 ENCOUNTER — Ambulatory Visit: Payer: Managed Care, Other (non HMO)

## 2012-12-12 ENCOUNTER — Ambulatory Visit
Admission: RE | Admit: 2012-12-12 | Discharge: 2012-12-12 | Disposition: A | Discharge status: Home / Self Care | Payer: Managed Care, Other (non HMO) | Source: Ambulatory Visit | Attending: Radiation Oncology | Admitting: Radiation Oncology

## 2012-12-12 ENCOUNTER — Encounter: Payer: Self-pay | Admitting: Radiation Oncology

## 2012-12-12 VITALS — BP 125/79 | HR 92 | Temp 97.1°F | Resp 20 | Wt 203.9 lb

## 2012-12-12 DIAGNOSIS — C50419 Malignant neoplasm of upper-outer quadrant of unspecified female breast: Secondary | ICD-10-CM

## 2012-12-12 NOTE — Progress Notes (Signed)
   Weekly Management Note:  outpatient Current Dose:  18 Gy  Projected Dose: 60 Gy   Narrative:  The patient presents for routine under treatment assessment.  CBCT/MVCT images/Port film x-rays were reviewed.  The chart was checked. No complaints  Physical Findings:  weight is 203 lb 14.4 oz (92.488 kg). Her oral temperature is 97.1 F (36.2 C). Her blood pressure is 125/79 and her pulse is 92. Her respiration is 20.  very faint erythema over the left breast.   Impression:  The patient is tolerating radiotherapy.  Plan:  Continue radiotherapy as planned.  ________________________________   Lonie Peak, M.D.

## 2012-12-12 NOTE — Progress Notes (Signed)
Pt denies pain, fatigue, loss of appetite. She is applying Radiaplex to left breast tx area. 

## 2012-12-13 ENCOUNTER — Ambulatory Visit: Payer: Managed Care, Other (non HMO)

## 2012-12-13 ENCOUNTER — Encounter (INDEPENDENT_AMBULATORY_CARE_PROVIDER_SITE_OTHER): Payer: Self-pay

## 2012-12-13 ENCOUNTER — Ambulatory Visit
Admission: RE | Admit: 2012-12-13 | Discharge: 2012-12-13 | Disposition: A | Discharge status: Home / Self Care | Payer: Managed Care, Other (non HMO) | Source: Ambulatory Visit | Attending: Radiation Oncology | Admitting: Radiation Oncology

## 2012-12-14 ENCOUNTER — Ambulatory Visit
Admission: RE | Admit: 2012-12-14 | Discharge: 2012-12-14 | Disposition: A | Discharge status: Home / Self Care | Payer: Managed Care, Other (non HMO) | Source: Ambulatory Visit | Attending: Radiation Oncology | Admitting: Radiation Oncology

## 2012-12-14 ENCOUNTER — Ambulatory Visit: Payer: Managed Care, Other (non HMO)

## 2012-12-15 ENCOUNTER — Ambulatory Visit
Admission: RE | Admit: 2012-12-15 | Discharge: 2012-12-15 | Disposition: A | Discharge status: Home / Self Care | Payer: Managed Care, Other (non HMO) | Source: Ambulatory Visit | Attending: Radiation Oncology | Admitting: Radiation Oncology

## 2012-12-15 ENCOUNTER — Ambulatory Visit: Payer: Managed Care, Other (non HMO)

## 2012-12-18 ENCOUNTER — Ambulatory Visit
Admission: RE | Admit: 2012-12-18 | Discharge: 2012-12-18 | Disposition: A | Discharge status: Home / Self Care | Payer: Managed Care, Other (non HMO) | Source: Ambulatory Visit | Attending: Radiation Oncology | Admitting: Radiation Oncology

## 2012-12-18 ENCOUNTER — Ambulatory Visit: Payer: Managed Care, Other (non HMO)

## 2012-12-19 ENCOUNTER — Encounter: Payer: Self-pay | Admitting: Radiation Oncology

## 2012-12-19 ENCOUNTER — Ambulatory Visit
Admission: RE | Admit: 2012-12-19 | Discharge: 2012-12-19 | Disposition: A | Discharge status: Home / Self Care | Payer: Managed Care, Other (non HMO) | Source: Ambulatory Visit | Attending: Radiation Oncology | Admitting: Radiation Oncology

## 2012-12-19 ENCOUNTER — Ambulatory Visit: Payer: Managed Care, Other (non HMO)

## 2012-12-19 VITALS — BP 118/77 | HR 84 | Temp 97.9°F | Wt 201.8 lb

## 2012-12-19 DIAGNOSIS — C50419 Malignant neoplasm of upper-outer quadrant of unspecified female breast: Secondary | ICD-10-CM

## 2012-12-19 NOTE — Progress Notes (Signed)
   Weekly Management Note:  outpatient Current Dose:  28 Gy  Projected Dose: 60 Gy   Narrative:  The patient presents for routine under treatment assessment.  CBCT/MVCT images/Port film x-rays were reviewed.  The chart was checked. Doing well.  Physical Findings:  weight is 201 lb 12.8 oz (91.536 kg). Her temperature is 97.9 F (36.6 C). Her blood pressure is 118/77 and her pulse is 84.  early erythema over the left breast, skin is intact  Impression:  The patient is tolerating radiotherapy.  Plan:  Continue radiotherapy as planned. Continue radiaplex 2-3 times daily  ________________________________   Lonie Peak, M.D.

## 2012-12-20 ENCOUNTER — Ambulatory Visit
Admission: RE | Admit: 2012-12-20 | Discharge: 2012-12-20 | Disposition: A | Discharge status: Home / Self Care | Payer: Managed Care, Other (non HMO) | Source: Ambulatory Visit | Attending: Radiation Oncology | Admitting: Radiation Oncology

## 2012-12-20 ENCOUNTER — Ambulatory Visit: Payer: Managed Care, Other (non HMO)

## 2012-12-21 ENCOUNTER — Ambulatory Visit
Admission: RE | Admit: 2012-12-21 | Discharge: 2012-12-21 | Disposition: A | Discharge status: Home / Self Care | Payer: Managed Care, Other (non HMO) | Source: Ambulatory Visit | Attending: Radiation Oncology | Admitting: Radiation Oncology

## 2012-12-21 ENCOUNTER — Encounter: Payer: Self-pay | Admitting: Oncology

## 2012-12-21 ENCOUNTER — Ambulatory Visit: Payer: Managed Care, Other (non HMO)

## 2012-12-22 ENCOUNTER — Ambulatory Visit: Payer: Managed Care, Other (non HMO)

## 2012-12-22 ENCOUNTER — Ambulatory Visit
Admission: RE | Admit: 2012-12-22 | Discharge: 2012-12-22 | Disposition: A | Discharge status: Home / Self Care | Payer: Managed Care, Other (non HMO) | Source: Ambulatory Visit | Attending: Radiation Oncology | Admitting: Radiation Oncology

## 2012-12-24 ENCOUNTER — Other Ambulatory Visit: Payer: Self-pay | Admitting: *Deleted

## 2012-12-24 ENCOUNTER — Encounter: Payer: Self-pay | Admitting: *Deleted

## 2012-12-24 DIAGNOSIS — C50419 Malignant neoplasm of upper-outer quadrant of unspecified female breast: Secondary | ICD-10-CM

## 2012-12-24 NOTE — Progress Notes (Signed)
Mailed intake form to pt. 

## 2012-12-25 ENCOUNTER — Ambulatory Visit
Admission: RE | Admit: 2012-12-25 | Discharge: 2012-12-25 | Disposition: A | Discharge status: Home / Self Care | Payer: Managed Care, Other (non HMO) | Source: Ambulatory Visit | Attending: Radiation Oncology | Admitting: Radiation Oncology

## 2012-12-25 ENCOUNTER — Ambulatory Visit: Payer: Managed Care, Other (non HMO)

## 2012-12-26 ENCOUNTER — Ambulatory Visit: Payer: Managed Care, Other (non HMO)

## 2012-12-27 ENCOUNTER — Ambulatory Visit: Payer: Managed Care, Other (non HMO)

## 2012-12-28 ENCOUNTER — Ambulatory Visit
Admission: RE | Admit: 2012-12-28 | Discharge: 2012-12-28 | Disposition: A | Discharge status: Home / Self Care | Payer: Managed Care, Other (non HMO) | Source: Ambulatory Visit | Attending: Radiation Oncology | Admitting: Radiation Oncology

## 2012-12-28 ENCOUNTER — Ambulatory Visit: Payer: Managed Care, Other (non HMO)

## 2012-12-29 ENCOUNTER — Ambulatory Visit
Admission: RE | Admit: 2012-12-29 | Discharge: 2012-12-29 | Disposition: A | Discharge status: Home / Self Care | Payer: Managed Care, Other (non HMO) | Source: Ambulatory Visit | Attending: Radiation Oncology | Admitting: Radiation Oncology

## 2012-12-29 ENCOUNTER — Ambulatory Visit: Payer: Managed Care, Other (non HMO)

## 2012-12-29 ENCOUNTER — Encounter: Payer: Self-pay | Admitting: Radiation Oncology

## 2012-12-29 VITALS — BP 110/68 | HR 89 | Resp 16 | Wt 198.0 lb

## 2012-12-29 DIAGNOSIS — C50419 Malignant neoplasm of upper-outer quadrant of unspecified female breast: Secondary | ICD-10-CM

## 2012-12-29 NOTE — Progress Notes (Signed)
   Weekly Management Note:  outpatient Current Dose:  40 Gy  Projected Dose: 60 Gy   Narrative:  The patient presents for routine under treatment assessment.  CBCT/MVCT images/Port film x-rays were reviewed.  The chart was checked. Doing well. No complaints. In good spirits.  Physical Findings:  weight is 198 lb (89.812 kg). Her blood pressure is 110/68 and her pulse is 89. Her respiration is 16.  left breast shows modest erythema, skin intact  Impression:  The patient is tolerating radiotherapy.  Plan:  Continue radiotherapy as planned. Continue Radiaplex. She is going to start finding your new normal class.  ________________________________   Lonie Peak, M.D.

## 2012-12-29 NOTE — Progress Notes (Signed)
Patient presents to the clinic today unaccompanied for PUT with Dr. Basilio Cairo. Patient alert and oriented to person, place, and time. No distress noted. Steady gait noted. Pleasant affect noted. Patient denies pain at this time. Patient denies hyperpigmentation of left/treated breast. Patient reports using radiaplex bid as directed. Patient reports that her energy level has been unaffected. Reported all findings to Dr. Basilio Cairo.

## 2013-01-01 ENCOUNTER — Ambulatory Visit
Admission: RE | Admit: 2013-01-01 | Discharge: 2013-01-01 | Disposition: A | Discharge status: Home / Self Care | Payer: Managed Care, Other (non HMO) | Source: Ambulatory Visit | Attending: Radiation Oncology | Admitting: Radiation Oncology

## 2013-01-01 ENCOUNTER — Ambulatory Visit: Payer: Managed Care, Other (non HMO)

## 2013-01-02 ENCOUNTER — Ambulatory Visit
Admission: RE | Admit: 2013-01-02 | Discharge: 2013-01-02 | Disposition: A | Discharge status: Home / Self Care | Payer: Managed Care, Other (non HMO) | Source: Ambulatory Visit | Attending: Radiation Oncology | Admitting: Radiation Oncology

## 2013-01-02 VITALS — BP 123/77 | HR 77 | Temp 98.3°F | Wt 199.9 lb

## 2013-01-02 DIAGNOSIS — C50419 Malignant neoplasm of upper-outer quadrant of unspecified female breast: Secondary | ICD-10-CM

## 2013-01-02 MED ORDER — RADIAPLEXRX EX GEL
Freq: Once | CUTANEOUS | Status: AC
  Administered 2013-01-02: 16:00:00 via TOPICAL

## 2013-01-02 NOTE — Progress Notes (Signed)
   Weekly Management Note:  outpatient Current Dose:  44 Gy  Projected Dose: 60 Gy   Narrative:  The patient presents for routine under treatment assessment.  CBCT/MVCT images/Port film x-rays were reviewed.  The chart was checked. Doing well. No new complaints.  Physical Findings:  weight is 199 lb 14.4 oz (90.674 kg). Her temperature is 98.3 F (36.8 C). Her blood pressure is 123/77 and her pulse is 77.  modest erythema over left  Breast - skin intact  Impression:  The patient is tolerating radiotherapy.  Plan:  Continue radiotherapy as planned.  ________________________________   Lonie Peak, M.D.

## 2013-01-02 NOTE — Progress Notes (Signed)
Patient here for routine weekly assessment of left  breast cancer radiation.Moderat redness.No peeling.Occasional  Fatigue.

## 2013-01-03 ENCOUNTER — Ambulatory Visit: Payer: Managed Care, Other (non HMO)

## 2013-01-03 ENCOUNTER — Ambulatory Visit
Admission: RE | Admit: 2013-01-03 | Discharge: 2013-01-03 | Disposition: A | Discharge status: Home / Self Care | Payer: Managed Care, Other (non HMO) | Source: Ambulatory Visit | Attending: Radiation Oncology | Admitting: Radiation Oncology

## 2013-01-04 ENCOUNTER — Ambulatory Visit: Payer: Managed Care, Other (non HMO)

## 2013-01-04 ENCOUNTER — Ambulatory Visit
Admission: RE | Admit: 2013-01-04 | Discharge: 2013-01-04 | Disposition: A | Discharge status: Home / Self Care | Payer: Managed Care, Other (non HMO) | Source: Ambulatory Visit | Attending: Radiation Oncology | Admitting: Radiation Oncology

## 2013-01-05 ENCOUNTER — Ambulatory Visit
Admission: RE | Admit: 2013-01-05 | Discharge: 2013-01-05 | Disposition: A | Discharge status: Home / Self Care | Payer: Managed Care, Other (non HMO) | Source: Ambulatory Visit | Attending: Radiation Oncology | Admitting: Radiation Oncology

## 2013-01-05 ENCOUNTER — Ambulatory Visit: Payer: Managed Care, Other (non HMO)

## 2013-01-08 ENCOUNTER — Encounter: Payer: Self-pay | Admitting: Radiation Oncology

## 2013-01-08 ENCOUNTER — Ambulatory Visit
Admission: RE | Admit: 2013-01-08 | Discharge: 2013-01-08 | Disposition: A | Discharge status: Home / Self Care | Payer: Managed Care, Other (non HMO) | Source: Ambulatory Visit | Attending: Radiation Oncology | Admitting: Radiation Oncology

## 2013-01-08 ENCOUNTER — Telehealth: Payer: Self-pay | Admitting: Emergency Medicine

## 2013-01-08 ENCOUNTER — Ambulatory Visit: Payer: Managed Care, Other (non HMO)

## 2013-01-08 DIAGNOSIS — C50419 Malignant neoplasm of upper-outer quadrant of unspecified female breast: Secondary | ICD-10-CM

## 2013-01-08 MED ORDER — LORAZEPAM 0.5 MG PO TABS: 0.5000 mg | ORAL_TABLET | Freq: Four times a day (QID) | ORAL | Status: DC | PRN

## 2013-01-08 NOTE — Telephone Encounter (Signed)
Patient calling with complaints of difficulty sleeping.  She is also complaining of nausea with her radiation.  Patient requesting a refill of her Ativan that was previously prescribed by Dr Donnie Coffin.

## 2013-01-08 NOTE — Telephone Encounter (Signed)
Give her #20 of ativan 0.5 mg until she comes to see me on 2/20

## 2013-01-09 ENCOUNTER — Ambulatory Visit
Admission: RE | Admit: 2013-01-09 | Discharge: 2013-01-09 | Disposition: A | Discharge status: Home / Self Care | Payer: Managed Care, Other (non HMO) | Source: Ambulatory Visit | Attending: Radiation Oncology | Admitting: Radiation Oncology

## 2013-01-09 ENCOUNTER — Ambulatory Visit: Payer: Managed Care, Other (non HMO)

## 2013-01-09 ENCOUNTER — Encounter: Payer: Self-pay | Admitting: Radiation Oncology

## 2013-01-09 VITALS — BP 121/74 | HR 88 | Temp 98.1°F | Wt 197.7 lb

## 2013-01-09 DIAGNOSIS — C50419 Malignant neoplasm of upper-outer quadrant of unspecified female breast: Secondary | ICD-10-CM

## 2013-01-09 NOTE — Progress Notes (Signed)
Simulation Verification Note:  Outpatient, 01-08-13, Left Breast boost  The patient was brought to the treatment unit and placed in the planned treatment position. The clinical setup was verified. Then port films were obtained and uploaded to the radiation oncology medical record software.  The treatment beams were carefully compared against the planned radiation fields. The position location and shape of the radiation fields was reviewed. They targeted volume of tissue appears to be appropriately covered by the radiation beams. Organs at risk appear to be excluded as planned.  Based on my personal review, I approved the simulation verification. The patient's treatment will proceed as planned.  -----------------------------------  Lonie Peak, MD

## 2013-01-09 NOTE — Progress Notes (Signed)
27 fractions to left breast.  Skin intact with Bright erythema with hyperpigmentation noted in the inframmary fold and in the axilla.  Joann Page reports mild tenderness and reports she has a "good" energy level.

## 2013-01-09 NOTE — Progress Notes (Signed)
   Weekly Management Note:  outpatient Current Dose:  54 Gy  Projected Dose: 60 Gy   Narrative:  The patient presents for routine under treatment assessment.  CBCT/MVCT images/Port film x-rays were reviewed.  The chart was checked. Doing well. No new complaints.  Physical Findings:  weight is 197 lb 11.2 oz (89.676 kg). Her temperature is 98.1 F (36.7 C). Her blood pressure is 121/74 and her pulse is 88.  skin looks excellent. Diffuse erythema throughout the left breast. Skin intact.  Impression:  The patient is tolerating radiotherapy.  Plan:  Continue radiotherapy as planned. Patient given a card to followup with me in one month. I am very proud of her!  ________________________________   Lonie Peak, M.D.

## 2013-01-10 ENCOUNTER — Ambulatory Visit: Payer: Managed Care, Other (non HMO) | Admitting: Oncology

## 2013-01-10 ENCOUNTER — Other Ambulatory Visit: Payer: Managed Care, Other (non HMO) | Admitting: Lab

## 2013-01-10 ENCOUNTER — Ambulatory Visit: Payer: Managed Care, Other (non HMO)

## 2013-01-10 ENCOUNTER — Ambulatory Visit
Admission: RE | Admit: 2013-01-10 | Discharge: 2013-01-10 | Disposition: A | Discharge status: Home / Self Care | Payer: Managed Care, Other (non HMO) | Source: Ambulatory Visit | Attending: Radiation Oncology | Admitting: Radiation Oncology

## 2013-01-11 ENCOUNTER — Ambulatory Visit
Admission: RE | Admit: 2013-01-11 | Discharge: 2013-01-11 | Disposition: A | Discharge status: Home / Self Care | Payer: Managed Care, Other (non HMO) | Source: Ambulatory Visit | Attending: Radiation Oncology | Admitting: Radiation Oncology

## 2013-01-11 ENCOUNTER — Ambulatory Visit: Payer: Managed Care, Other (non HMO)

## 2013-01-12 ENCOUNTER — Encounter: Payer: Self-pay | Admitting: Radiation Oncology

## 2013-01-12 ENCOUNTER — Ambulatory Visit
Admission: RE | Admit: 2013-01-12 | Discharge: 2013-01-12 | Disposition: A | Discharge status: Home / Self Care | Payer: Managed Care, Other (non HMO) | Source: Ambulatory Visit | Attending: Radiation Oncology | Admitting: Radiation Oncology

## 2013-01-12 ENCOUNTER — Ambulatory Visit: Payer: Managed Care, Other (non HMO)

## 2013-01-13 NOTE — Progress Notes (Signed)
  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  She developed diffuse erythema through the left breast, but her skin remained intact  Plan: The patient has completed radiation treatment. The patient will return to radiation oncology clinic for routine followup in one month. I advised them to call or return sooner if they have any questions or concerns related to their recovery or treatment.  -----------------------------------  Lonie Peak, MD

## 2013-01-13 NOTE — Progress Notes (Signed)
Name: DEYA BIGOS   MRN: [id number]  Date:  01/08/2013    DOB: July 11, 1961  Status:outpatient    DIAGNOSIS: Breast cancer.  CONSENT VERIFIED: yes   SET UP: Patient is setup supine   IMMOBILIZATION:  The following immobilization was used:Custom Moldable Pillow, breast board.   NARRATIVE: [name] underwent complex simulation and treatment planning for her lumpectomy cavity boost treatment today.  Her tumor volume was outlined on the planning CT scan.  Due to the depth of her cavity, electrons could not be used and a photon plan was developed. The plan will be prescribed to the 100% Isodose line.   3   fields will be used with  3 MLCs comprising 3  treatment devices.  10Gy in 5 fractions has been prescribed.

## 2013-01-15 ENCOUNTER — Ambulatory Visit: Payer: Managed Care, Other (non HMO)

## 2013-01-18 ENCOUNTER — Telehealth: Payer: Self-pay | Admitting: Oncology

## 2013-01-18 ENCOUNTER — Ambulatory Visit (HOSPITAL_BASED_OUTPATIENT_CLINIC_OR_DEPARTMENT_OTHER): Payer: Managed Care, Other (non HMO) | Admitting: Oncology

## 2013-01-18 ENCOUNTER — Other Ambulatory Visit: Payer: Managed Care, Other (non HMO) | Admitting: Lab

## 2013-01-18 VITALS — BP 135/87 | HR 84 | Temp 98.2°F | Resp 20 | Ht 66.0 in | Wt 196.5 lb

## 2013-01-18 DIAGNOSIS — Z79810 Long term (current) use of selective estrogen receptor modulators (SERMs): Secondary | ICD-10-CM

## 2013-01-18 DIAGNOSIS — Z170 Estrogen receptor positive status [ER+]: Secondary | ICD-10-CM

## 2013-01-18 DIAGNOSIS — C50419 Malignant neoplasm of upper-outer quadrant of unspecified female breast: Secondary | ICD-10-CM

## 2013-01-18 DIAGNOSIS — C50919 Malignant neoplasm of unspecified site of unspecified female breast: Secondary | ICD-10-CM

## 2013-01-18 HISTORY — DX: Long term (current) use of selective estrogen receptor modulators (serms): Z79.810

## 2013-01-18 LAB — COMPREHENSIVE METABOLIC PANEL (CC13)
ALT: 17 U/L (ref 0–55)
Alkaline Phosphatase: 53 U/L (ref 40–150)
Creatinine: 0.8 mg/dL (ref 0.6–1.1)
Glucose: 97 mg/dl (ref 70–99)
Sodium: 139 mEq/L (ref 136–145)
Total Bilirubin: 0.25 mg/dL (ref 0.20–1.20)
Total Protein: 6.9 g/dL (ref 6.4–8.3)

## 2013-01-18 LAB — CBC WITH DIFFERENTIAL/PLATELET
BASO%: 0.3 % (ref 0.0–2.0)
HCT: 38.9 % (ref 34.8–46.6)
LYMPH%: 28.1 % (ref 14.0–49.7)
MCHC: 33.4 g/dL (ref 31.5–36.0)
MCV: 87.4 fL (ref 79.5–101.0)
MONO%: 6.3 % (ref 0.0–14.0)
NEUT%: 63.7 % (ref 38.4–76.8)
Platelets: 181 10*3/uL (ref 145–400)
RBC: 4.45 10*6/uL (ref 3.70–5.45)

## 2013-01-18 MED ORDER — TAMOXIFEN CITRATE 20 MG PO TABS: 20.0000 mg | ORAL_TABLET | Freq: Every day | ORAL | Status: DC

## 2013-01-18 NOTE — Telephone Encounter (Signed)
gv pt appt schedule for May. °

## 2013-01-18 NOTE — Patient Instructions (Addendum)
Proceed with tamoxifen 20 mg daily  We discussed the benefits and potential side effects  Tamoxifen oral tablet What is this medicine? TAMOXIFEN (ta MOX i fen) blocks the effects of estrogen. It is commonly used to treat breast cancer. It is also used to decrease the chance of breast cancer coming back in women who have received treatment for the disease. It may also help prevent breast cancer in women who have a high risk of developing breast cancer. This medicine may be used for other purposes; ask your health care provider or pharmacist if you have questions. What should I tell my health care provider before I take this medicine? They need to know if you have any of these conditions: -blood clots -blood disease -cataracts or impaired eyesight -endometriosis -high calcium levels -high cholesterol -irregular menstrual cycles -liver disease -stroke -uterine fibroids -an unusual or allergic reaction to tamoxifen, other medicines, foods, dyes, or preservatives -pregnant or trying to get pregnant -breast-feeding How should I use this medicine? Take this medicine by mouth with a glass of water. Follow the directions on the prescription label. You can take it with or without food. Take your medicine at regular intervals. Do not take your medicine more often than directed. Do not stop taking except on your doctor's advice. A special MedGuide will be given to you by the pharmacist with each prescription and refill. Be sure to read this information carefully each time. Talk to your pediatrician regarding the use of this medicine in children. While this drug may be prescribed for selected conditions, precautions do apply. Overdosage: If you think you have taken too much of this medicine contact a poison control center or emergency room at once. NOTE: This medicine is only for you. Do not share this medicine with others. What if I miss a dose? If you miss a dose, take it as soon as you can. If it  is almost time for your next dose, take only that dose. Do not take double or extra doses. What may interact with this medicine? -aminoglutethimide -bromocriptine -chemotherapy drugs -female hormones, like estrogens and birth control pills -letrozole -medroxyprogesterone -phenobarbital -rifampin -warfarin This list may not describe all possible interactions. Give your health care provider a list of all the medicines, herbs, non-prescription drugs, or dietary supplements you use. Also tell them if you smoke, drink alcohol, or use illegal drugs. Some items may interact with your medicine. What should I watch for while using this medicine? Visit your doctor or health care professional for regular checks on your progress. You will need regular pelvic exams, breast exams, and mammograms. If you are taking this medicine to reduce your risk of getting breast cancer, you should know that this medicine does not prevent all types of breast cancer. If breast cancer or other problems occur, there is no guarantee that it will be found at an early stage. Do not become pregnant while taking this medicine or for 2 months after stopping this medicine. Stop taking this medicine if you get pregnant or think you are pregnant and contact your doctor. This medicine may harm your unborn baby. Women who can possibly become pregnant should use birth control methods that do not use hormones during tamoxifen treatment and for 2 months after therapy has stopped. Talk with your health care provider for birth control advice. Do not breast feed while taking this medicine. What side effects may I notice from receiving this medicine? Side effects that you should report to your doctor or health  care professional as soon as possible: -changes in vision (blurred vision) -changes in your menstrual cycle -difficulty breathing or shortness of breath -difficulty walking or talking -new breast lumps -numbness -pelvic pain or  pressure -redness, blistering, peeling or loosening of the skin, including inside the mouth -skin rash or itching (hives) -sudden chest pain -swelling of lips, face, or tongue -swelling, pain or tenderness in your calf or leg -unusual bruising or bleeding -vaginal discharge that is bloody, brown, or rust -weakness -yellowing of the whites of the eyes or skin Side effects that usually do not require medical attention (report to your doctor or health care professional if they continue or are bothersome): -fatigue -hair loss, although uncommon and is usually mild -headache -hot flashes -impotence (in men) -nausea, vomiting (mild) -vaginal discharge (white or clear) This list may not describe all possible side effects. Call your doctor for medical advice about side effects. You may report side effects to FDA at 1-800-FDA-1088. Where should I keep my medicine? Keep out of the reach of children. Store at room temperature between 20 and 25 degrees C (68 and 77 degrees F). Protect from light. Keep container tightly closed. Throw away any unused medicine after the expiration date. NOTE: This sheet is a summary. It may not cover all possible information. If you have questions about this medicine, talk to your doctor, pharmacist, or health care provider.  2012, Elsevier/Gold Standard. (08/01/2008 12:01:56 PM)

## 2013-01-24 ENCOUNTER — Encounter: Payer: Self-pay | Admitting: Oncology

## 2013-01-24 NOTE — Progress Notes (Signed)
Patient left message in regard to [facility] fund. I called her back to her know she has 361.01 left. She said they told her she had used it up. The [employer] is just about depleted but only 38.99 used of [facility].

## 2013-01-30 NOTE — Progress Notes (Signed)
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  PRIOR THERAPY: #52 y.o. female who was found on screening mammography to have an abnormality in her left breast. This was in the 12:00 position. Ultrasound measured the lesion to be 1.2 cm greatest dimension. Initial biopsy was nondiagnostic. A stereotactic biopsy on 07/03/2012 showed invasive ductal carcinoma.RI of her breasts was performed on a 913. This demonstrated a 2.3 x 1.8 x 1.6 cm enhancing mass with irregular margins at the 12:00 position of the left breast. There are no additional masses or areas of enhancement that were suspicious in either breast. There were no abnormal appearing lymph nodes  #2 She went on to undergo a lumpectomy and sentinel lymph node biopsy on 07/17/2012. All 4 sentinel lymph nodes were negative. The tumor measured 1.7 cm. The margins are clear. There is no LVSI. 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 Therefore she was treated adjuvantly with Taxotere Cytoxan every 3 weeks for a total of 4 cycles.  #5 she then went on to receive radiation therapyfrom 11/30/2012 through02/14/2014.  #6 patient will begin adjuvant tamoxifen 20 mg daily for 10 years. I explained the rationale for treatment with tamoxifen. Risks and benefits of tamoxifen were discussed with her. She will begin this 01/18/2013.    CURRENT THERAPY:tamoxifen 20 mg daily  INTERVAL HISTORY: Joann Page 52 y.o. female returns for followup visit post completion of  radiation therapy. Prior to today patient was being followed by Dr. Pierce Crane. Clinically patient seems to be doing well without any complaints. She denies any nausea vomiting fevers chills night sweats headaches no shortness of breath chest pains or palpitations no myalgias and arthralgias. Overall she tolerated the radiation well. Remainder of the 10 point review of systems is negative.  MEDICAL HISTORY: Past Medical History  Diagnosis Date  . Knee pain, right   . Obesity   . Cancer 07/07/12    Left Breast Inv Ductal Ca.  . No pertinent past medical history   . H/O hiatal hernia     small  . Hyperlipidemia   . Status post chemotherapy comp 10/27/12     4 CyclesTaxotere/ Cytoxan  . Breast cancer 07/17/12    at age 58, ER/PR +, hER 2 -  . Asthma   . 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 Degenerative disk disease 2010    Lumbar spine  . GERD (gastroesophageal reflux disease)     hx    ALLERGIES:  is allergic to sulfa antibiotics.  MEDICATIONS:  Current Outpatient Prescriptions  Medication Sig Dispense Refill  . famotidine (PEPCID) 10 MG tablet Take 10 mg by mouth 2 (two) times daily.      Marland Kitchen HYDROcodone-acetaminophen (NORCO/VICODIN) 5-325 MG per tablet Take 1 tablet by mouth every 4 (four) hours as needed (4- 6 hours prn). pain  30 tablet  1  . ibuprofen (ADVIL,MOTRIN) 200 MG tablet Take 400 mg by mouth every 6 (six) hours as needed. pain      . LORazepam (ATIVAN) 0.5 MG tablet Take  1 tablet (0.5 mg total) by mouth every 6 (six) hours as needed (Nausea or vomiting).  20 tablet  0  . hyaluronate sodium (RADIAPLEXRX) GEL Apply topically 2 (two) times daily.      . tamoxifen (NOLVADEX) 20 MG tablet Take 1 tablet (20 mg total) by mouth daily.  90 tablet  12   No current facility-administered medications for this visit.    SURGICAL HISTORY:  Past Surgical History  Procedure Laterality Date  . Breath tek h pylori  12/07/2011    Procedure: BREATH TEK H PYLORI;  Surgeon: Kandis Cocking,  MD;  Location: [facility];  Service: Endoscopy;  Laterality: N/A;  . Needle core biopsy  06/30/12    Left Breast: 12 O'clock/ Benign Parenchyma  . 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  . Abdominal hysterectomy  04/2000    partial, fibroids    REVIEW OF SYSTEMS:  Pertinent items are noted in HPI.   HEALTH MAINTENANCE:  PHYSICAL EXAMINATION: Blood pressure 135/87, pulse 84, temperature 98.2 F (36.8 C), temperature source Oral, resp. rate 20, height 5\' 6"  (1.676 m), weight 196 lb 8 oz (89.132 kg). Body mass index is 31.73 kg/(m^2). ECOG PERFORMANCE STATUS: 0 - Asymptomatic   General appearance: alert, cooperative and appears stated age Resp: clear to auscultation bilaterally Back: symmetric, no curvature. ROM normal. No CVA tenderness. Cardio: regular rate and rhythm GI: soft, non-tender; bowel sounds normal; no masses,  no organomegaly Extremities: extremities normal, atraumatic, no cyanosis or edema Neurologic: Grossly normal Right breast examination no masses nipple discharge or skin changes. Left breast reveals well-healing incisional scar no masses or nipple discharge.  LABORATORY DATA: Lab Results  Component Value Date   WBC 3.2* 01/18/2013   HGB 13.0 01/18/2013   HCT 38.9 01/18/2013   MCV 87.4 01/18/2013   PLT 181 01/18/2013      Chemistry      Component Value Date/Time   NA 139 01/18/2013 1428   NA 140 11/24/2011 1625   K 3.6 01/18/2013 1428   K 3.8 11/24/2011 1625   CL 105 01/18/2013 1428   CL 106 11/24/2011 1625   CO2 25 01/18/2013 1428   CO2 26 11/24/2011 1625   BUN 8.6 01/18/2013 1428   BUN 9 11/24/2011 1625   CREATININE 0.8 01/18/2013 1428   CREATININE 0.79 11/24/2011 1625   CREATININE 0.72 05/02/2011 2018      Component Value  Date/Time   CALCIUM 9.5 01/18/2013 1428   CALCIUM 9.0 11/24/2011 1625   ALKPHOS 53 01/18/2013 1428   ALKPHOS 43 11/24/2011 1625   AST 18 01/18/2013 1428   AST 16 11/24/2011 1625   ALT 17 01/18/2013 1428   ALT 19 11/24/2011 1625   BILITOT 0.25 01/18/2013 1428   BILITOT 0.2* 11/24/2011 1625       RADIOGRAPHIC STUDIES:  No results found.  ASSESSMENT: 52 year old female with  #1 stage I (T1 C. N0) invasive ductal carcinoma that was ER positive PR positive HER-2/neu equivocal. Patient has undergone a lumpectomy with sentinel lymph node biopsy. She had Oncotype DX testing which showed her risk of breast cancer recurrence to be in the intermediate risk category. Therefore she was recommended adjuvant chemotherapy by Dr. Pierce Crane. She received Taxotere Cytoxan for a total of 4 cycles.  After that she went on to receive radiation therapy to the left breast. She completed this on 01/12/2013.  #2 patient will now begin adjuvant antiestrogen therapy with tamoxifen 20 mg daily. Risks and benefits are understood by the patient literature was given to her. She understands that her treatment will be for 10 years.   PLAN:   #1 proceed with tamoxifen 20 mg daily.  #2 patient will be seen back by me in about 3 months time.  #3 she knows to call me with any problems.   All questions were answered. The patient knows to call the clinic with any problems, questions or concerns. We can certainly see the patient much sooner if necessary.  I spent 40 minutes counseling the patient face to face. The total time spent in the appointment was 40 minutes.    Drue Second, MD Medical/Oncology [facility] [phone] (beeper) [phone] (Office)  01/30/2013, 10:39 PM

## 2013-01-31 ENCOUNTER — Telehealth: Payer: Self-pay | Admitting: Medical Oncology

## 2013-01-31 NOTE — Telephone Encounter (Signed)
Patient with question regarding tamoxifen, stated one night she forgot to take it on time and took it two hours late, concerned that this was a problem. Informed patient that it is important to take the medication on time but an occasional late time is not a problem, informed patient to take as soon as she remembers and to take next dose as usual, patient stated she did do that. Will inform MD.  Also had question regarding a bill she received gave pt number to call [phone] regarding billing questions. Patient expressed thanks. No further questions at this time. Patient knows to call with any questions or concerns.

## 2013-02-13 ENCOUNTER — Ambulatory Visit: Payer: Managed Care, Other (non HMO) | Admitting: Radiation Oncology

## 2013-02-19 ENCOUNTER — Encounter: Payer: Self-pay | Admitting: Oncology

## 2013-02-19 NOTE — Progress Notes (Signed)
Patient left a message and I called her back. She said she received a bill 3000.00 odd????? I told her I would check and see if I saw a charge and call her back. I looked at our billings and nothing that high.

## 2013-02-22 ENCOUNTER — Encounter: Payer: Self-pay | Admitting: Radiation Oncology

## 2013-02-23 ENCOUNTER — Encounter: Payer: Self-pay | Admitting: Radiation Oncology

## 2013-02-23 ENCOUNTER — Ambulatory Visit
Admission: RE | Admit: 2013-02-23 | Discharge: 2013-02-23 | Disposition: A | Discharge status: Home / Self Care | Payer: Managed Care, Other (non HMO) | Source: Ambulatory Visit | Attending: Radiation Oncology | Admitting: Radiation Oncology

## 2013-02-23 VITALS — BP 120/90 | HR 85 | Temp 98.0°F | Wt 191.2 lb

## 2013-02-23 DIAGNOSIS — C50412 Malignant neoplasm of upper-outer quadrant of left female breast: Secondary | ICD-10-CM

## 2013-02-23 HISTORY — DX: Personal history of irradiation: Z92.3

## 2013-02-23 HISTORY — DX: Long term (current) use of selective estrogen receptor modulators (serms): Z79.810

## 2013-02-23 NOTE — Progress Notes (Signed)
  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  She is doing well. She recently went to a college basketball game with other breast cancer survivors and had a great time. She seems to be doing better and better emotionally. She has no complaints related to her breast . She recently started tamoxifen        ALLERGIES:  is allergic to sulfa antibiotics.  Meds: Current Outpatient Prescriptions  Medication Sig Dispense Refill  . Glucosamine-Chondroitin-MSM 500-200-150 MG TABS Take 1 tablet by mouth 3 (three) times daily.      Marland Kitchen ibuprofen (ADVIL,MOTRIN) 200 MG tablet Take 400 mg by mouth every 6 (six) hours as needed. pain      . tamoxifen (NOLVADEX) 10 MG tablet Take 10 mg by mouth daily.      . famotidine (PEPCID) 10 MG tablet Take 10 mg by mouth at bedtime as needed.       . hyaluronate sodium (RADIAPLEXRX) GEL Apply topically 2 (two) times daily.      Marland Kitchen HYDROcodone-acetaminophen (NORCO/VICODIN) 5-325 MG per tablet Take 1 tablet by mouth every 4 (four) hours as needed (4- 6 hours prn). pain  30 tablet  1  . LORazepam (ATIVAN) 0.5 MG tablet Take 1 tablet (0.5 mg total) by mouth every 6 (six) hours as needed (Nausea or vomiting).  20 tablet  0   No current facility-administered medications for this encounter.    Physical Findings: The patient is in no acute distress. Patient is alert and oriented.  weight is 191 lb 3.2 oz (86.728 kg). Her temperature is 98 F (36.7 C). Her blood pressure is 120/90 and her pulse is 85. Marland Kitchen  She has some residual hyperpigmentation and dryness  over her left breast  Lab Findings: Lab Results  Component Value Date   WBC 3.2* 01/18/2013   HGB 13.0 01/18/2013   HCT 38.9 01/18/2013   MCV 87.4 01/18/2013   PLT 181 01/18/2013     Radiographic Findings: No results found.  Impression/Plan: I recommended vitamin E lotion over her left breast for further healing. I encouraged her to continue with yearly mammography and followup with medical oncology. I will see her back on an as-needed basis. I have encouraged her to call if she has any issues or concerns in the future. I wished her the very best.  I spent 25 minutes minutes face to face with the patient and more than 50% of that time was spent in counseling and/or coordination of care. _____________________________________   Lonie Peak, MD

## 2013-02-23 NOTE — Progress Notes (Signed)
Joann Page here for follow up after 30 fractions to her left breast.  She denies pain and fatigue.  She started taking Tamoxifen last week.  The skin on her left breast is intact with some hyperpigmentation.

## 2013-02-28 ENCOUNTER — Encounter: Payer: Self-pay | Admitting: Oncology

## 2013-02-28 NOTE — Progress Notes (Signed)
Called the patient to advise 566.00 billing she has to call them and set up pmts with them. The [facility] and [employer] bills were faxed. I advised her she needed to call them and get correct balance now and then I can get those paid from her bal 315 [facility] fund. I called her back and left on vmail their ph#s and acct#s for her to call them and get all info. Advised the Monia Pouch would have to be reviewed as to why denied.

## 2013-03-05 ENCOUNTER — Encounter: Payer: Self-pay | Admitting: Oncology

## 2013-03-05 NOTE — Progress Notes (Signed)
Called the patient back and she was not at home and would call me when she got home. She had 7000 bill I advised looked like discount 95% had been taken, but she needs to call billing to make sure.

## 2013-03-07 ENCOUNTER — Encounter: Payer: Self-pay | Admitting: Oncology

## 2013-03-07 NOTE — Progress Notes (Signed)
Per Elizabeth/Darlena I will forward the bill form [employer] that the patient bought in to [employer]. $3350.00.

## 2013-03-08 ENCOUNTER — Encounter: Payer: Self-pay | Admitting: Oncology

## 2013-03-08 NOTE — Progress Notes (Signed)
Called and spoke with Joann Page at [facility] and they will not honor our discount(95%) but will take 40% discount off of her balance, making new balance 2010.00. I called and let her know to call them and get monthly pmts set up on new bal 2010.00. I gave her ph# and acct#

## 2013-03-14 ENCOUNTER — Ambulatory Visit (INDEPENDENT_AMBULATORY_CARE_PROVIDER_SITE_OTHER): Payer: Self-pay | Admitting: Surgery

## 2013-03-30 ENCOUNTER — Encounter: Payer: Self-pay | Admitting: Oncology

## 2013-03-30 NOTE — Progress Notes (Signed)
Patient had left a message and I called her back and had to leave her a message.

## 2013-04-11 ENCOUNTER — Ambulatory Visit (INDEPENDENT_AMBULATORY_CARE_PROVIDER_SITE_OTHER): Payer: Private Health Insurance - Indemnity | Admitting: Surgery

## 2013-04-11 ENCOUNTER — Encounter (INDEPENDENT_AMBULATORY_CARE_PROVIDER_SITE_OTHER): Payer: Self-pay | Admitting: Surgery

## 2013-04-11 VITALS — BP 120/86 | HR 76 | Temp 97.0°F | Resp 18 | Ht 67.0 in | Wt 193.6 lb

## 2013-04-11 DIAGNOSIS — C50912 Malignant neoplasm of unspecified site of left female breast: Secondary | ICD-10-CM

## 2013-04-11 DIAGNOSIS — C50919 Malignant neoplasm of unspecified site of unspecified female breast: Secondary | ICD-10-CM

## 2013-04-11 NOTE — Progress Notes (Signed)
Re:   Joann Page DOB:   12-02-1960 MRN:   [id number]  ASSESSMENT AND PLAN: 1.  Left breast cancer, 1 o'clock.  T1c, N0.  Left breast lumpectomy and left axillary SLNBx - 07/17/2012  Final path: 1.7 cm IDC, 0/4 nodes, margins clear  ER - 100%, PR - 31%, Her2Neu - neg, Ki-67 - 40%  Oncotype DX - 29, 19% recurrence rate.    Medical and radiation oncology - Drs. Welton Flakes Donnie Coffin) and [place].  Finished radiation tx 11/11/2012.  On tamoxifen.  I will see her back in 6 months.  1a. Genetics - [Neg BRCA - 09/19/2012]   2.  Morbid obesity.  She had thought about weight loss surgery in Apr 17, 2011, but decided not to go through with a lap band. 3.  Arthritis of the knees, see Dr. Otis Brace 4.  Hyperlipidemia.  Chief Complaint  Patient presents with  . Follow-up    LTF   REFERRING PHYSICIAN:  Dr. Lucita Ferrara  HISTORY OF PRESENT ILLNESS: Joann Page is a 52 y.o. (DOB: [birth year])   female whose primary care physician is [facility], MD and comes to follow up of a left breast cancer.  She is doing well.  She still has a sore left breast.  She is concerned about how well she will tolerate the mammograms.  We talked about follow up.  Breast History: She had an abnormality identified on mammogram and underwent 2 left core biopsies.  The first, 06/30/2012, was negative.  The second, 07/03/2012, showed invasive ductal ca. She has no prior history of breast abnormalities.  She had a hysterectomy in 04-16-00 for fibroids.  Still has her ovaries and is no hormone meds. Her sister, Joann Page's mother, had breast cancer in her late 2023/04/17 and died of breast cancer.  This is the only family member with breast cancer.   Past Medical History  Diagnosis Date  . Knee pain, right   . Obesity   . Cancer 07/07/12    Left Breast Inv Ductal Ca.  . No pertinent past medical history   . H/O hiatal hernia     small  . Hyperlipidemia   . Status post chemotherapy comp 10/27/12     4 CyclesTaxotere/ Cytoxan  . Breast cancer  07/17/12    at age 6, ER/PR +, hER 2 -  . Asthma   . 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 Degenerative disk disease Apr 16, 2009    Lumbar spine  . GERD (gastroesophageal reflux disease)     hx  . S/P radiation therapy 11/30/12 - 01/12/13    Left Breast / 50 gray in 25 Fractions with boost 10 gray in 5 Fractions  . Status post chemotherapy Comp. 01/12/13    neoadjuvant chemotherapy: 4 Cycles of Taxotere/Cytoxan  . Use of tamoxifen (Nolvadex) 01/18/13    Current Outpatient Prescriptions  Medication Sig Dispense Refill  . tamoxifen (NOLVADEX) 10 MG tablet Take 10 mg by mouth daily.      . famotidine (PEPCID) 10 MG tablet Take 10 mg by mouth at bedtime as needed.       . Glucosamine-Chondroitin-MSM 500-200-150 MG TABS Take 1 tablet by mouth 3 (three) times daily.      Marland Kitchen HYDROcodone-acetaminophen (NORCO/VICODIN) 5-325 MG per tablet Take 1 tablet by mouth every 4 (four) hours as needed (4- 6 hours prn). pain  30 tablet  1  . ibuprofen (ADVIL,MOTRIN) 200 MG tablet Take 400 mg by mouth every 6 (six) hours as needed. pain  No current facility-administered medications for this visit.    Allergies  Allergen Reactions  . Sulfa Antibiotics Shortness Of Breath and Swelling    REVIEW OF SYSTEMS: Musculoskeletal:  Sees Dr. Otis Brace for knee pain/arthritis.  Has never had surgery.  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 She is marries and has one daughter 9 YO. She does not work.  PHYSICAL EXAM: BP 120/86  Pulse 76  Temp(Src) 97 F (36.1 C) (Temporal)  Resp 18  Ht 5\' 7"  (1.702 m)  Wt 193 lb 9.6 oz (87.816 kg)  BMI 30.31 kg/m2  HEENT:  Pupils equal.  Dentition good. NECK:  Supple.  No thyroid mass. LYMPH NODES:  No cervical, supraclavicular, or axillary adenopathy. BREASTS -  RIGHT:  No palpable mass or nodule.  No nipple discharge.   LEFT:  Scar UOQ of left breast.  Some tenderness on palpation of the breast.  No palpable mass or nodule.  No nipple  discharge. UPPER EXTREMITIES:  No evidence of lymphedema.  DATA REVIEWED: Reports in Epic.  Ovidio Kin, MD,  [facility] [address] [place]  [address]   [place], [place]    [address] Phone:  [phone] FAX:  [phone]

## 2013-04-16 ENCOUNTER — Other Ambulatory Visit: Payer: Self-pay | Admitting: Obstetrics and Gynecology

## 2013-04-16 DIAGNOSIS — Z853 Personal history of malignant neoplasm of breast: Secondary | ICD-10-CM

## 2013-04-25 ENCOUNTER — Telehealth: Payer: Self-pay | Admitting: Oncology

## 2013-04-25 ENCOUNTER — Other Ambulatory Visit (HOSPITAL_BASED_OUTPATIENT_CLINIC_OR_DEPARTMENT_OTHER): Payer: Managed Care, Other (non HMO) | Admitting: Lab

## 2013-04-25 ENCOUNTER — Ambulatory Visit (HOSPITAL_BASED_OUTPATIENT_CLINIC_OR_DEPARTMENT_OTHER): Payer: Managed Care, Other (non HMO) | Admitting: Oncology

## 2013-04-25 ENCOUNTER — Other Ambulatory Visit: Payer: Self-pay | Admitting: Emergency Medicine

## 2013-04-25 VITALS — BP 132/77 | HR 80 | Temp 98.3°F | Resp 20 | Ht 67.0 in | Wt 195.8 lb

## 2013-04-25 DIAGNOSIS — C50919 Malignant neoplasm of unspecified site of unspecified female breast: Secondary | ICD-10-CM

## 2013-04-25 DIAGNOSIS — C50912 Malignant neoplasm of unspecified site of left female breast: Secondary | ICD-10-CM

## 2013-04-25 DIAGNOSIS — Z170 Estrogen receptor positive status [ER+]: Secondary | ICD-10-CM

## 2013-04-25 LAB — CBC WITH DIFFERENTIAL/PLATELET
BASO%: 0.4 % (ref 0.0–2.0)
EOS%: 1.8 % (ref 0.0–7.0)
HCT: 35.2 % (ref 34.8–46.6)
MCH: 29.1 pg (ref 25.1–34.0)
MCHC: 33.6 g/dL (ref 31.5–36.0)
MONO#: 0.3 10*3/uL (ref 0.1–0.9)
NEUT%: 54.6 % (ref 38.4–76.8)
RBC: 4.07 10*6/uL (ref 3.70–5.45)
WBC: 3.9 10*3/uL (ref 3.9–10.3)
lymph#: 1.4 10*3/uL (ref 0.9–3.3)

## 2013-04-25 LAB — COMPREHENSIVE METABOLIC PANEL (CC13)
ALT: 18 U/L (ref 0–55)
AST: 18 U/L (ref 5–34)
CO2: 24 mEq/L (ref 22–29)
Calcium: 8.9 mg/dL (ref 8.4–10.4)
Chloride: 108 mEq/L — ABNORMAL HIGH (ref 98–107)
Creatinine: 0.7 mg/dL (ref 0.6–1.1)
Sodium: 140 mEq/L (ref 136–145)
Total Bilirubin: 0.28 mg/dL (ref 0.20–1.20)
Total Protein: 6.6 g/dL (ref 6.4–8.3)

## 2013-04-25 NOTE — Telephone Encounter (Signed)
, °

## 2013-04-25 NOTE — Patient Instructions (Addendum)
You are doing well  Continue taking tamoxifen 20 mg daily  I will see you back you in 6 months

## 2013-05-07 ENCOUNTER — Encounter: Payer: Self-pay | Admitting: Oncology

## 2013-05-07 NOTE — Progress Notes (Signed)
Patient left message and I called her back. She wanted to use funds to pay bill. I advised her to make sure here discount was taken and to get new application for assistance from billing. She said she has several bills and she can't pay. She wanted to use her funds. I advised her to call me back and she is aware of her funds that are left.

## 2013-05-20 NOTE — Progress Notes (Signed)
OFFICE PROGRESS NOTE  CC  ROSS,ALLAN, MD 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  PRIOR THERAPY: #52 y.o. female who was found on screening mammography to have an abnormality in her left breast. This was in the 12:00 position. Ultrasound measured the lesion to be 1.2 cm greatest dimension. Initial biopsy was nondiagnostic. A stereotactic biopsy on 07/03/2012 showed invasive ductal carcinoma.RI of her breasts was performed on a 913. This demonstrated a 2.3 x 1.8 x 1.6 cm enhancing mass with irregular margins at the 12:00 position of the left breast. There are no additional masses or areas of enhancement that were suspicious in either breast. There were no abnormal appearing lymph nodes  #2 She went on to undergo a lumpectomy and sentinel lymph node biopsy on 07/17/2012. All 4 sentinel lymph nodes were negative. The tumor measured 1.7 cm. The margins are clear. There is no LVSI. 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 Therefore she was treated adjuvantly with Taxotere Cytoxan every 3 weeks for a total of 4 cycles.  #5 she then went on to receive radiation therapyfrom 11/30/2012 through02/14/2014.  #6 patient will begin adjuvant tamoxifen 20 mg daily for 10 years. I explained the rationale for treatment with tamoxifen. Risks and benefits of tamoxifen were discussed with her. She will begin this 01/18/2013.    CURRENT THERAPY:tamoxifen 20 mg daily  INTERVAL HISTORY: Joann Page 52 y.o. female returns for followup visit post completion of  radiation therapy. Prior to today patient was being followed by Dr. Pierce Crane. Clinically patient seems to be doing well without any complaints. She denies any nausea vomiting fevers chills night sweats headaches no shortness of breath chest pains or palpitations no myalgias and arthralgias. Overall she tolerated the radiation well. Remainder of the 10 point review of systems is negative.  MEDICAL HISTORY: Past Medical History  Diagnosis Date  . Knee pain, right   . Obesity   . Cancer 07/07/12    Left Breast Inv Ductal Ca.  . No pertinent past medical history   . H/O hiatal hernia     small  . Hyperlipidemia   . Status post chemotherapy comp 10/27/12     4 CyclesTaxotere/ Cytoxan  . Breast cancer 07/17/12    at age 89, ER/PR +, hER 2 -  . Asthma   . 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 Degenerative disk disease 2010    Lumbar spine  . GERD (gastroesophageal reflux disease)     hx  . S/P radiation therapy 11/30/12 - 01/12/13    Left Breast / 50 gray in 25 Fractions with boost 10 gray in 5 Fractions  . Status post chemotherapy Comp. 01/12/13    neoadjuvant chemotherapy: 4 Cycles of Taxotere/Cytoxan  . Use of tamoxifen (Nolvadex) 01/18/13    ALLERGIES:  is allergic to sulfa antibiotics.  MEDICATIONS:  Current Outpatient Prescriptions  Medication Sig Dispense Refill  . famotidine (PEPCID) 10 MG tablet Take 10 mg by mouth at bedtime as needed.       . Glucosamine-Chondroitin-MSM 500-200-150 MG TABS Take 1 tablet by  mouth 3 (three) times daily.      Marland Kitchen ibuprofen (ADVIL,MOTRIN) 200 MG tablet Take 400 mg by mouth every 6 (six) hours as needed. pain      . tamoxifen (NOLVADEX) 10 MG tablet Take 10 mg by mouth daily.      Marland Kitchen HYDROcodone-acetaminophen (NORCO/VICODIN) 5-325 MG per tablet Take 1 tablet by mouth every 4 (four) hours as needed (4- 6 hours prn). pain  30 tablet  1   No current facility-administered medications for this visit.    SURGICAL HISTORY:  Past Surgical History  Procedure  Laterality Date  . Breath tek h pylori  12/07/2011    Procedure: BREATH TEK H PYLORI;  Surgeon: Kandis Cocking, MD;  Location: [facility];  Service: Endoscopy;  Laterality: N/A;  . Needle core biopsy  06/30/12    Left Breast: 12 O'clock/ Benign Parenchyma  . 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  . Abdominal hysterectomy  04/2000    partial, fibroids    REVIEW OF SYSTEMS:  Pertinent items are noted in HPI.   HEALTH MAINTENANCE:  PHYSICAL EXAMINATION: Blood pressure 132/77, pulse 80, temperature 98.3 F (36.8 C), temperature source Oral, resp. rate 20, height 5\' 7"  (1.702 m), weight 195 lb 12.8 oz (88.814 kg). Body mass index is 30.66 kg/(m^2). ECOG PERFORMANCE STATUS: 0 - Asymptomatic   General appearance: alert, cooperative and appears stated age Resp: clear to auscultation bilaterally Back: symmetric, no curvature. ROM normal. No CVA tenderness. Cardio: regular rate and rhythm GI: soft, non-tender; bowel sounds normal; no masses,  no organomegaly Extremities: extremities normal, atraumatic, no cyanosis or edema Neurologic: Grossly normal Right breast examination no masses nipple discharge or skin changes. Left breast reveals well-healing incisional scar no masses or nipple discharge.  LABORATORY DATA: Lab Results  Component Value Date   WBC 3.9 04/25/2013   HGB 11.8 04/25/2013   HCT 35.2 04/25/2013   MCV 86.5 04/25/2013   PLT 186 04/25/2013      Chemistry      Component Value Date/Time   NA 140 04/25/2013 1340   NA 140 11/24/2011 1625   K 3.6 04/25/2013 1340   K 3.8 11/24/2011 1625   CL 108* 04/25/2013 1340   CL 106 11/24/2011 1625   CO2 24 04/25/2013 1340   CO2 26 11/24/2011 1625   BUN 8.3 04/25/2013 1340   BUN 9 11/24/2011 1625    CREATININE 0.7 04/25/2013 1340   CREATININE 0.79 11/24/2011 1625   CREATININE 0.72 05/02/2011 2018      Component Value Date/Time   CALCIUM 8.9 04/25/2013 1340   CALCIUM 9.0 11/24/2011 1625   ALKPHOS 51 04/25/2013 1340   ALKPHOS 43 11/24/2011 1625   AST 18 04/25/2013 1340   AST 16 11/24/2011 1625   ALT 18 04/25/2013 1340   ALT 19 11/24/2011 1625   BILITOT 0.28 04/25/2013 1340   BILITOT 0.2* 11/24/2011 1625       RADIOGRAPHIC STUDIES:  No results found.  ASSESSMENT: 52 year old female with  #1 stage I (T1 C. N0) invasive ductal carcinoma that was ER positive PR positive HER-2/neu equivocal. Patient has undergone a lumpectomy with sentinel lymph node biopsy. She had Oncotype DX testing which showed her risk of breast cancer recurrence to be in the intermediate risk category.  Therefore she was recommended adjuvant chemotherapy by Dr. Pierce Crane. She received Taxotere Cytoxan for a total of 4 cycles. After that she went on to receive radiation therapy to the left breast. She completed this on 01/12/2013.  #2 patient will now begin adjuvant antiestrogen therapy with tamoxifen 20 mg daily. Risks and benefits are understood by the patient literature was given to her. She understands that her treatment will be for 10 years.   PLAN:   #1 patient is doing extremely well tolerating tamoxifen without any significant problems. She will continue taking this 20 mg daily total of 10 years of therapy is planned.  #2 she'll be seen back in 6 months time for followup.   All questions were answered. The patient knows to call the clinic with any problems, questions or concerns. We can certainly see the patient much sooner if necessary.  I spent 25 minutes counseling the patient face to face. The total time spent in the appointment was 30 minutes.    Drue Second, MD Medical/Oncology [facility] [phone] (beeper) [phone] (Office)

## 2013-07-02 ENCOUNTER — Ambulatory Visit
Admission: RE | Admit: 2013-07-02 | Discharge: 2013-07-02 | Disposition: A | Discharge status: Home / Self Care | Payer: Managed Care, Other (non HMO) | Source: Ambulatory Visit | Attending: Obstetrics and Gynecology | Admitting: Obstetrics and Gynecology

## 2013-07-02 DIAGNOSIS — Z853 Personal history of malignant neoplasm of breast: Secondary | ICD-10-CM

## 2013-07-12 ENCOUNTER — Other Ambulatory Visit: Payer: Self-pay | Admitting: Sports Medicine

## 2013-07-12 DIAGNOSIS — M545 Low back pain, unspecified: Secondary | ICD-10-CM

## 2013-07-12 DIAGNOSIS — M5410 Radiculopathy, site unspecified: Secondary | ICD-10-CM

## 2013-07-17 ENCOUNTER — Other Ambulatory Visit: Payer: Managed Care, Other (non HMO)

## 2013-07-29 ENCOUNTER — Ambulatory Visit
Admission: RE | Admit: 2013-07-29 | Discharge: 2013-07-29 | Disposition: A | Discharge status: Home / Self Care | Payer: 59 | Source: Ambulatory Visit | Attending: Sports Medicine | Admitting: Sports Medicine

## 2013-07-29 DIAGNOSIS — M5410 Radiculopathy, site unspecified: Secondary | ICD-10-CM

## 2013-07-29 DIAGNOSIS — M545 Low back pain, unspecified: Secondary | ICD-10-CM

## 2013-08-09 ENCOUNTER — Other Ambulatory Visit: Payer: Self-pay | Admitting: Sports Medicine

## 2013-08-09 DIAGNOSIS — M545 Low back pain, unspecified: Secondary | ICD-10-CM

## 2013-08-14 ENCOUNTER — Ambulatory Visit
Admission: RE | Admit: 2013-08-14 | Discharge: 2013-08-14 | Disposition: A | Discharge status: Home / Self Care | Payer: Managed Care, Other (non HMO) | Source: Ambulatory Visit | Attending: Sports Medicine | Admitting: Sports Medicine

## 2013-08-14 DIAGNOSIS — M545 Low back pain, unspecified: Secondary | ICD-10-CM

## 2013-08-14 MED ORDER — IOHEXOL 180 MG/ML  SOLN: 1.0000 mL | Freq: Once | INTRAMUSCULAR | Status: AC | PRN

## 2013-08-14 MED ORDER — METHYLPREDNISOLONE ACETATE 40 MG/ML INJ SUSP (RADIOLOG: 120.0000 mg | Freq: Once | INTRAMUSCULAR | Status: DC

## 2013-09-24 ENCOUNTER — Telehealth: Payer: Self-pay | Admitting: Oncology

## 2013-09-24 NOTE — Telephone Encounter (Signed)
, °

## 2013-10-08 ENCOUNTER — Telehealth: Payer: Self-pay | Admitting: Oncology

## 2013-10-08 NOTE — Telephone Encounter (Signed)
pt called to rs 11/11 appt bc she has been able to get a new job and starts orientation on 11/11 first avail 30 min for KK was 11/23/13 shh

## 2013-10-09 ENCOUNTER — Other Ambulatory Visit: Payer: Managed Care, Other (non HMO) | Admitting: Lab

## 2013-10-09 ENCOUNTER — Ambulatory Visit: Payer: Self-pay | Admitting: Oncology

## 2013-10-12 ENCOUNTER — Ambulatory Visit (INDEPENDENT_AMBULATORY_CARE_PROVIDER_SITE_OTHER): Payer: Self-pay | Admitting: Surgery

## 2013-10-12 ENCOUNTER — Ambulatory Visit: Payer: Private Health Insurance - Indemnity | Admitting: Oncology

## 2013-10-12 ENCOUNTER — Other Ambulatory Visit: Payer: Private Health Insurance - Indemnity | Admitting: Lab

## 2013-10-17 ENCOUNTER — Ambulatory Visit (INDEPENDENT_AMBULATORY_CARE_PROVIDER_SITE_OTHER): Payer: Private Health Insurance - Indemnity | Admitting: Surgery

## 2013-10-17 ENCOUNTER — Encounter (INDEPENDENT_AMBULATORY_CARE_PROVIDER_SITE_OTHER): Payer: Self-pay | Admitting: Surgery

## 2013-10-17 VITALS — BP 124/80 | HR 80 | Temp 98.2°F | Resp 15 | Ht 67.0 in | Wt 207.8 lb

## 2013-10-17 DIAGNOSIS — C50912 Malignant neoplasm of unspecified site of left female breast: Secondary | ICD-10-CM

## 2013-10-17 DIAGNOSIS — C50919 Malignant neoplasm of unspecified site of unspecified female breast: Secondary | ICD-10-CM

## 2013-10-17 NOTE — Progress Notes (Signed)
Re:   Joann Page DOB:   Dec 26, 1960 MRN:   [id number]  ASSESSMENT AND PLAN: 1.  Left breast cancer, 1 o'clock.  T1c, N0.  Left breast lumpectomy and left axillary SLNBx - 07/17/2012  Final path: 1.7 cm IDC, 0/4 nodes, margins clear, ER - 100%, PR - 31%, Her2Neu - neg, Ki-67 - 40%  Oncotype DX - 29, 19% recurrence rate.    Medical and radiation oncology - Drs. Welton Flakes Donnie Coffin) and [place].  Finished radiation tx 11/11/2012.  On tamoxifen.  Disease free.  I will see her back in 6 months.  1a. Genetics - [Neg BRCA - 09/19/2012]   2.  Morbid obesity.  She had thought about weight loss surgery in Apr 25, 2011, but decided not to go through with a lap band. 3.  Arthritis of the knees, see Dr. Otis Brace 4.  Hyperlipidemia.  Chief Complaint  Patient presents with  . Breast Cancer Long Term Follow Up    LTFU 6 mo lf br recheck   REFERRING PHYSICIAN:  Dr. Lucita Ferrara  HISTORY OF PRESENT ILLNESS: Joann Page is a 52 y.o. (DOB: [birth year])   female whose primary care physician is [facility], MD and comes to follow up of a left breast cancer.  She is doing well.  She does not like breast exams, but knows that they need to be done.  She has no area of concern.  The tamoxifen does give her hot flashes.  Breast History: She had an abnormality identified on mammogram and underwent 2 left core biopsies.  The first, 06/30/2012, was negative.  The second, 07/03/2012, showed invasive ductal ca. She has no prior history of breast abnormalities.  She had a hysterectomy in 04-24-00 for fibroids.  Still has her ovaries and is no hormone meds. Her sister, Shapelle's mother, had breast cancer in her late 04-25-2023 and died of breast cancer.  This is the only family member with breast cancer.   Past Medical History  Diagnosis Date  . Knee pain, right   . Obesity   . Cancer 07/07/12    Left Breast Inv Ductal Ca.  . No pertinent past medical history   . H/O hiatal hernia     small  . Hyperlipidemia   . Status post  chemotherapy comp 10/27/12     4 CyclesTaxotere/ Cytoxan  . Breast cancer 07/17/12    at age 60, ER/PR +, hER 2 -  . Asthma   . 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 Degenerative disk disease Apr 24, 2009    Lumbar spine  . GERD (gastroesophageal reflux disease)     hx  . S/P radiation therapy 11/30/12 - 01/12/13    Left Breast / 50 gray in 25 Fractions with boost 10 gray in 5 Fractions  . Status post chemotherapy Comp. 01/12/13    neoadjuvant chemotherapy: 4 Cycles of Taxotere/Cytoxan  . Use of tamoxifen (Nolvadex) 01/18/13    Current Outpatient Prescriptions  Medication Sig Dispense Refill  . famotidine (PEPCID) 10 MG tablet Take 10 mg by mouth at bedtime as needed.       . Glucosamine-Chondroitin-MSM 500-200-150 MG TABS Take 1 tablet by mouth 3 (three) times daily.      Marland Kitchen HYDROcodone-acetaminophen (NORCO/VICODIN) 5-325 MG per tablet Take 1 tablet by mouth every 4 (four) hours as needed (4- 6 hours prn). pain  30 tablet  1  . ibuprofen (ADVIL,MOTRIN) 200 MG tablet Take 400 mg by mouth every 6 (six) hours as needed. pain      .  tamoxifen (NOLVADEX) 10 MG tablet Take 10 mg by mouth daily.       No current facility-administered medications for this visit.    Allergies  Allergen Reactions  . Sulfa Antibiotics Shortness Of Breath and Swelling    REVIEW OF SYSTEMS: Musculoskeletal:  Sees Dr. Otis Brace for knee pain/arthritis.  Has never had surgery.  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 She does not work. 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  She finishes Dec. 2015.  PHYSICAL EXAM: BP 124/80  Pulse 80  Temp(Src) 98.2 F (36.8 C) (Temporal)  Resp 15  Ht 5\' 7"  (1.702 m)  Wt 207 lb 12.8 oz (94.257 kg)  BMI 32.54 kg/m2  HEENT:  Pupils equal.  Dentition good. NECK:  Supple.  No thyroid mass. LYMPH NODES:  No cervical, supraclavicular, or axillary adenopathy. BREASTS -  RIGHT:  No palpable mass or nodule.   No nipple discharge.   LEFT:  Scar UOQ of left breast.  Some tenderness on palpation of the breast.  No palpable mass or nodule.  No nipple discharge. UPPER EXTREMITIES:  No evidence of lymphedema.  DATA REVIEWED: Last mammogram ([facility]) - 07/02/2013 - normal  Ovidio Kin, MD,  [facility] [address] [place]  [address]   [place], [place]    [address] Phone:  [phone] FAX:  [phone]

## 2013-10-18 ENCOUNTER — Telehealth: Payer: Self-pay | Admitting: Oncology

## 2013-10-18 NOTE — Telephone Encounter (Signed)
, °

## 2013-11-23 ENCOUNTER — Other Ambulatory Visit: Payer: 59

## 2013-11-23 ENCOUNTER — Ambulatory Visit: Payer: Self-pay | Admitting: Oncology

## 2013-11-23 ENCOUNTER — Other Ambulatory Visit: Payer: 59 | Admitting: Lab

## 2013-11-23 ENCOUNTER — Ambulatory Visit: Payer: Self-pay | Admitting: Adult Health

## 2013-12-12 ENCOUNTER — Encounter: Payer: Self-pay | Admitting: Oncology

## 2013-12-12 ENCOUNTER — Telehealth: Payer: Self-pay | Admitting: *Deleted

## 2013-12-12 ENCOUNTER — Ambulatory Visit (HOSPITAL_BASED_OUTPATIENT_CLINIC_OR_DEPARTMENT_OTHER): Payer: 59 | Admitting: Oncology

## 2013-12-12 ENCOUNTER — Other Ambulatory Visit (HOSPITAL_BASED_OUTPATIENT_CLINIC_OR_DEPARTMENT_OTHER): Payer: 59

## 2013-12-12 ENCOUNTER — Encounter (INDEPENDENT_AMBULATORY_CARE_PROVIDER_SITE_OTHER): Payer: Self-pay

## 2013-12-12 VITALS — BP 128/74 | HR 83 | Temp 98.1°F | Resp 18 | Ht 67.0 in | Wt 208.2 lb

## 2013-12-12 DIAGNOSIS — C50912 Malignant neoplasm of unspecified site of left female breast: Secondary | ICD-10-CM

## 2013-12-12 DIAGNOSIS — C50419 Malignant neoplasm of upper-outer quadrant of unspecified female breast: Secondary | ICD-10-CM

## 2013-12-12 DIAGNOSIS — Z170 Estrogen receptor positive status [ER+]: Secondary | ICD-10-CM

## 2013-12-12 DIAGNOSIS — C50919 Malignant neoplasm of unspecified site of unspecified female breast: Secondary | ICD-10-CM

## 2013-12-12 LAB — COMPREHENSIVE METABOLIC PANEL (CC13)
ALBUMIN: 3.4 g/dL — AB (ref 3.5–5.0)
ALT: 19 U/L (ref 0–55)
ANION GAP: 10 meq/L (ref 3–11)
AST: 16 U/L (ref 5–34)
Alkaline Phosphatase: 51 U/L (ref 40–150)
BUN: 8 mg/dL (ref 7.0–26.0)
CALCIUM: 9 mg/dL (ref 8.4–10.4)
CHLORIDE: 107 meq/L (ref 98–109)
CO2: 25 meq/L (ref 22–29)
Creatinine: 0.7 mg/dL (ref 0.6–1.1)
GLUCOSE: 88 mg/dL (ref 70–140)
POTASSIUM: 3.9 meq/L (ref 3.5–5.1)
Sodium: 142 mEq/L (ref 136–145)
Total Bilirubin: <0.2 mg/dL (ref 0.20–1.20)
Total Protein: 6.8 g/dL (ref 6.4–8.3)

## 2013-12-12 LAB — CBC WITH DIFFERENTIAL/PLATELET
BASO%: 0.7 % (ref 0.0–2.0)
BASOS ABS: 0 10*3/uL (ref 0.0–0.1)
EOS ABS: 0.1 10*3/uL (ref 0.0–0.5)
EOS%: 2.1 % (ref 0.0–7.0)
HCT: 38.4 % (ref 34.8–46.6)
HEMOGLOBIN: 12.7 g/dL (ref 11.6–15.9)
LYMPH#: 1.6 10*3/uL (ref 0.9–3.3)
LYMPH%: 39.8 % (ref 14.0–49.7)
MCH: 29.1 pg (ref 25.1–34.0)
MCHC: 33.1 g/dL (ref 31.5–36.0)
MCV: 88 fL (ref 79.5–101.0)
MONO#: 0.3 10*3/uL (ref 0.1–0.9)
MONO%: 8.3 % (ref 0.0–14.0)
NEUT%: 49.1 % (ref 38.4–76.8)
NEUTROS ABS: 1.9 10*3/uL (ref 1.5–6.5)
Platelets: 204 10*3/uL (ref 145–400)
RBC: 4.36 10*6/uL (ref 3.70–5.45)
RDW: 11.4 % (ref 11.2–14.5)
WBC: 4 10*3/uL (ref 3.9–10.3)

## 2013-12-12 NOTE — Telephone Encounter (Signed)
appts made and printed...td 

## 2013-12-12 NOTE — Progress Notes (Signed)
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  PRIOR THERAPY: #53 y.o. female who was found on screening mammography to have an abnormality in her left breast. This was in the 12:00 position. Ultrasound measured the lesion to be 1.2 cm greatest dimension. Initial biopsy was nondiagnostic. A stereotactic biopsy on 07/03/2012 showed invasive ductal carcinoma.RI of her breasts was performed on a 913. This demonstrated a 2.3 x 1.8 x 1.6 cm enhancing mass with irregular margins at the 12:00 position of the left breast. There are no additional masses or areas of enhancement that were suspicious in either breast. There were no abnormal appearing lymph nodes  #2 She went on to undergo a lumpectomy and sentinel lymph node biopsy on 07/17/2012. All 4 sentinel lymph nodes were negative. The tumor measured 1.7 cm. The margins are clear. There is no LVSI. 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 Therefore she was treated adjuvantly with Taxotere Cytoxan every 3 weeks for a total of 4 cycles.  #5 she then went on to receive radiation therapyfrom 11/30/2012 through02/14/2014.  #6  adjuvant tamoxifen 20 mg daily for 10 years. I explained the rationale for treatment with tamoxifen. Risks and benefits of tamoxifen were discussed with her. She will begin this 01/18/2013.    CURRENT THERAPY: tamoxifen 20 mg daily  INTERVAL HISTORY: Joann Page 53 y.o. female returns for followup visit Clinically patient seems to be doing  well without any complaints. She denies any nausea vomiting fevers chills night sweats headaches no shortness of breath chest pains or palpitations no myalgias and arthralgias. Overall she tolerated the radiation well. Remainder of the 10 point review of systems is negative.  MEDICAL HISTORY: Past Medical History  Diagnosis Date  . Knee pain, right   . Obesity   . Cancer 07/07/12    Left Breast Inv Ductal Ca.  . No pertinent past medical history   . H/O hiatal hernia     small  . Hyperlipidemia   . Status post chemotherapy comp 10/27/12     4 CyclesTaxotere/ Cytoxan  . Breast cancer 07/17/12    at age 13, ER/PR +, hER 2 -  . Asthma   . 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 Degenerative disk disease 2010    Lumbar spine  . GERD (gastroesophageal reflux disease)     hx  . S/P radiation therapy 11/30/12 - 01/12/13    Left Breast / 50 gray in 25 Fractions with boost 10 gray in 5 Fractions  . Status post chemotherapy Comp. 01/12/13    neoadjuvant chemotherapy: 4 Cycles of Taxotere/Cytoxan  . Use of tamoxifen (Nolvadex) 01/18/13    ALLERGIES:  is allergic to sulfa antibiotics.  MEDICATIONS:  Current Outpatient Prescriptions  Medication Sig Dispense Refill  . famotidine (PEPCID) 10 MG tablet Take 10 mg by mouth at bedtime as needed.       . Glucosamine-Chondroitin-MSM 500-200-150 MG TABS Take 1 tablet by mouth 3 (three) times daily.      Marland Kitchen HYDROcodone-acetaminophen (NORCO/VICODIN) 5-325 MG per tablet  Take 1 tablet by mouth every 4 (four) hours as needed (4- 6 hours prn). pain  30 tablet  1  . ibuprofen (ADVIL,MOTRIN) 200 MG tablet Take 400 mg by mouth every 6 (six) hours as needed. pain      . tamoxifen (NOLVADEX) 20 MG tablet Take 20 mg by mouth daily.       No current facility-administered medications for this visit.    SURGICAL HISTORY:  Past Surgical History  Procedure Laterality Date  . Breath tek h pylori  12/07/2011    Procedure: BREATH TEK H PYLORI;  Surgeon: Shann Medal, MD;   Location: [facility];  Service: Endoscopy;  Laterality: N/A;  . Needle core biopsy  06/30/12    Left Breast: 12 O'clock/ Benign Parenchyma  . 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  . Abdominal hysterectomy  04/2000    partial, fibroids    REVIEW OF SYSTEMS:  Pertinent items are noted in HPI.   HEALTH MAINTENANCE:  PHYSICAL EXAMINATION: Blood pressure 128/74, pulse 83, temperature 98.1 F (36.7 C), temperature source Oral, resp. rate 18, height _0  (1.702 m), weight 208 lb 3.2 oz (94.439 kg), SpO2 97.00%. Body mass index is 32.6 kg/(m^2). ECOG PERFORMANCE STATUS: 0 - Asymptomatic   General appearance: alert, cooperative and appears stated age Resp: clear to auscultation bilaterally Back: symmetric, no curvature. ROM normal. No CVA tenderness. Cardio: regular rate and rhythm GI: soft, non-tender; bowel sounds normal; no masses,  no organomegaly Extremities: extremities normal, atraumatic, no cyanosis or edema Neurologic: Grossly normal Right breast examination no masses nipple discharge or skin changes. Left breast reveals well-healing incisional scar no masses or nipple discharge.  LABORATORY DATA: Lab Results  Component Value Date   WBC 4.0 12/12/2013   HGB 12.7 12/12/2013   HCT 38.4 12/12/2013   MCV 88.0 12/12/2013   PLT 204 12/12/2013      Chemistry      Component Value Date/Time   NA 140 04/25/2013 1340   NA 140 11/24/2011 1625   K 3.6 04/25/2013 1340   K 3.8 11/24/2011 1625   CL 108* 04/25/2013 1340   CL 106 11/24/2011 1625   CO2 24 04/25/2013 1340   CO2 26 11/24/2011 1625   BUN 8.3 04/25/2013 1340   BUN 9 11/24/2011 1625   CREATININE 0.7 04/25/2013 1340   CREATININE 0.79 11/24/2011 1625   CREATININE 0.72 05/02/2011 2018      Component  Value Date/Time   CALCIUM 8.9 04/25/2013 1340   CALCIUM 9.0 11/24/2011 1625   ALKPHOS 51 04/25/2013 1340   ALKPHOS 43 11/24/2011 1625   AST 18 04/25/2013 1340   AST 16 11/24/2011 1625   ALT 18 04/25/2013 1340   ALT 19 11/24/2011 1625   BILITOT 0.28 04/25/2013 1340   BILITOT 0.2* 11/24/2011 1625       RADIOGRAPHIC STUDIES:  No results found.  ASSESSMENT: 53 year old female with  #1 stage I (T1 C. N0) invasive ductal carcinoma that was ER positive PR positive HER-2/neu equivocal. Patient has undergone a lumpectomy with sentinel lymph node biopsy. She had Oncotype DX testing which showed her risk of breast cancer recurrence to be in the intermediate risk category. Therefore she was recommended adjuvant chemotherapy by Dr. Eston Esters. She received Taxotere Cytoxan for  a total of 4 cycles. After that she went on to receive radiation therapy to the left breast. She completed this on 01/12/2013.  #2  adjuvant antiestrogen therapy with tamoxifen 20 mg daily. Risks and benefits are understood by the patient literature was given to her. She understands that her treatment will be for 10 years.   PLAN:   #1 patient is doing extremely well tolerating tamoxifen without any significant problems. She will continue taking this 20 mg daily total of 10 years of therapy is planned.  #2 she'll be seen back in 6 months time for followup.   All questions were answered. The patient knows to call the clinic with any problems, questions or concerns. We can certainly see the patient much sooner if necessary.  I spent 15 minutes counseling the patient face to face. The total time spent in the appointment was 30 minutes.    Marcy Panning, MD Medical/Oncology [facility] [phone] (beeper) [phone] (Office)

## 2013-12-12 NOTE — Patient Instructions (Signed)
Breast Cancer Survivor Follow-Up Breast cancer begins when cells in the breast divide too rapidly. The extra cells form a lump (tumor). When the cancer is treated, the goal is to get rid of all cancer cells. However, sometimes a few cells survive. These cancer cells can then grow. They become recurrent cancer. This means the cancer comes back after treatment.  Most cases of recurrent breast cancer develop 3 to 5 years after treatment. However, sometimes it comes back just a few months after treatment. Other times, it does not come back until years later. If the cancer comes back in the same area as the first breast cancer, it is called a local recurrence. If the cancer comes back somewhere else in the body, it is called regional recurrence if the site is fairly near the breast or distant recurrence if it is far from the breast. Your caregiver may also use the term metastasize to indicate a cancer that has gone to another part of your body. Treatment is still possible after either kind of recurrence. The cancer can still be controlled.  CAUSES OF RECURRENT CANCER No one knows exactly why breast cancer starts in the first place. Why the cancer comes back after treatment is also not clear. It is known that certain conditions, called risk factors, can make this more likely. They include:  Developing breast cancer for the first time before age 60.  Having breast cancer that involves the lymph nodes. These are small, round pieces of tissue found all over the body. Their job is to help fight infections.  Having a large tumor. Cancer is more apt to come back if the first tumor was bigger than 2 inches (5 cm).  Having certain types of breast cancer, such as:  Inflammatory breast cancer. This rare type grows rapidly and causes the breast to become red and swollen.  A high-grade tumor. The grade of a tumor indicates how fast it will grow and spread. High-grade tumors grow more quickly than other types.  HER2  cancer. This refers to the tumor's genetic makeup. Tumors that have this type of gene are more likely to come back after treatment.  Having close tumor margins. This refers to the space between the tumor and normal, noncancerous cells. If the space is small, the tumor has a greater chance of coming back.  Having treatment involving a surgery to remove the tumor but not the entire breast (lumpectomy) and no radiation therapy. CARE AFTER BREAST CANCER Home Monitoring Women who have had breast cancer should continue to examine their breasts every month. The goal is to catch the cancer quickly if it comes back. Many women find it helpful to do so on the same day each month and to mark the calendar as a reminder. Let your caregiver know immediately if you have any signs of recurrent breast cancer. Symptoms will vary, depending on where the cancer recurs. The original type of treatment can also make a difference. Symptoms of local recurrence after a lumpectomy or a recurrence in the opposite breast may include:  A new lump or thickening in the breast.  A change in the way the skin looks on the breast (such as a rash, dimpling, or wrinkling).  Redness or swelling of the breast.  Changes in the nipple (such as being red, puckered, swollen, or leaking fluid). Symptoms of a recurrence after a breast removal surgery (mastectomy) may include:  A lump or thickening under the skin.  A thickening around the mastectomy scar. Symptoms   of regional recurrence in the lymph nodes near the breast may include:  A lump under the arm or above the collarbone.  Swelling of the arm.  Pain in the arm, shoulder, or chest.  Numbness in the hand or arm. Symptoms of distant recurrence may include:  A cough that does not go away.  Trouble breathing or shortness of breath.  Pain in the bones or the chest. This is pain that lasts or does not respond to rest and medicine.  Headaches.  Sudden vision  problems.  Dizziness.  Nausea or vomiting.  Losing weight without trying to.  Persistent abdominal pain.  Changes in bowel movements or blood in the stool.  Yellowing of the skin or eyes (jaundice).  Blood in the urine or bloody vaginal discharge. Clinical Monitoring  It is helpful to keep a schedule of appointments for needed tests and exams. This includes physical exams, breast exams, exams of the lymph nodes, and general exams.  For the first 3 years after being treated for breast cancer, see your caregiver every 3 to 6 months.  For years 4 and 5 after breast cancer, see your caregiver every 6 to 12 months.  After 5 years, see your caregiver at least once a year.  Regular breast X-rays (mammograms) should continue even if you had a mastectomy.  A mammogram should be done 1 year after the mammogram that first detected breast cancer.  A mammogram should be done every 6 to 12 months after that. Follow your caregiver's advice.  A pelvic exam done by your caregiver checks whether female organs are the normal size and shape. The exam is usually done every year. Ask your caregiver if that schedule is right for you.  Women taking tamoxifen should report any vaginal bleeding immediately to their caregiver. Tamoxifen is often given to women with a certain type of breast cancer. It has been shown to help prevent recurrence.  You will need to decide who your primary caregiver will be.  Most people continue to see their cancer specialist (oncologist) every 3 to 6 months for the first year after cancer treatment.  At some point, you may want to go back to seeing your family caregiver. You would no longer see your oncologist for regular checkups. Many women do this about 1 year after their first diagnosis of breast cancer.  You will still need to be seen every so often by your oncologist. Ask how often that should be. Coordinate this with your family or primary caregiver.  Think about  having genetic counseling. This would provide information on traits that can be passed or inherited from one generation to the next. In some cases, breast cancer runs in families. Tell your caregiver if you:  Are of Ashkenazi Jewish heritage.  Have any family member who has had ovarian cancer.  Have a mother, sister, or daughter who had breast cancer before age 7.  Have 2 or more close relatives who have had breast cancer. This means a mother, sister, daughter, aunt, or grandmother.  Had breast cancer in both breasts.  Have a female relative who has had breast cancer.  Some tests are not recommended for routine screening. Someone recovering from breast cancer does not need to have these tests if there are no problems. The tests have risks, such as radiation exposure, and can be costly. The risks of these tests are thought to be greater than the benefits:  Blood tests.  Chest X-rays.  Bone scans.  Liver ultrasound.  Computed tomography (CT scan).  Positron emission tomography (PET scan).  Magnetic resonance imaging (MRI scan). DIAGNOSIS OF RECURRENT CANCER Recurrent breast cancer may be suspected for various reasons. A mammogram may not look normal. You might feel a lump or have other symptoms. Your caregiver may find something unusual during an exam. To be sure, your caregiver will probably order some tests. The tests are needed because there are symptoms or hints of a problem. They could include:  Blood tests, including a test to check how well the liver is working. The liver is a common site for a distant cancer recurrence.  Imaging tests that create pictures of the inside of the body. These tests include:  Chest X-rays to show if the cancer has come back in the lungs.  CT scans to create detailed pictures of various areas of the body and help find a distant recurrence.  MRI scans to find anything unusual in the breast, chest, or lymph nodes.  Breast ultrasound tests to  examine the breasts.  Bone scans to create a picture of your whole skeleton and find cancer in bony areas.  PET scans to create an image of the whole body. PET scans can be used together with CT scans to show more detail.  Biopsy. A small sample of tissue is taken and checked under a microscope. If cancer cells are found, they may be tested to see if they contain the HER2 gene or the hormones estrogen and progesterone. This will help your caregiver decide how to treat the recurrent cancer. TREATMENT  How recurrent breast cancer is treated depends on where the new cancer is found. The type of treatment that was used for the first breast cancer makes a difference, too. A combination of treatments may be used. Options include:  Surgery.  If the cancer comes back in the breast that was not treated before, you may need a lumpectomy or mastectomy.  If the cancer comes back in the breast that was treated before, you may need a mastectomy.  The lymph nodes under the arm may need to be removed.  Radiation therapy.  For a local recurrence, radiation may be used if it was not used during the first treatment.  For a distance recurrence, radiation is sometimes used.  Chemotherapy.  This may be used before surgery to treat recurrent breast cancer.  This may be used to treat recurrent cancer that cannot be treated with surgery.  This may be used to treat a distant recurrence.  Hormone therapy.  Women with the HER2 gene may be given hormone therapy to attack this gene. Document Released: 07/14/2011 Document Revised: 02/07/2012 Document Reviewed: 07/14/2011 Exit[employer] Patient Information 2014 [employer].  

## 2014-03-15 ENCOUNTER — Other Ambulatory Visit: Payer: Self-pay | Admitting: Oncology

## 2014-03-15 DIAGNOSIS — C50919 Malignant neoplasm of unspecified site of unspecified female breast: Secondary | ICD-10-CM

## 2014-04-08 ENCOUNTER — Telehealth: Payer: Self-pay

## 2014-04-08 NOTE — Telephone Encounter (Signed)
Pt called and would like to know if it is okay for her to take supplement Forskolin.   Routed - [facility]

## 2014-04-10 NOTE — Telephone Encounter (Signed)
Let pt know that as far as tamoxifen is concerned, supplement Forskolin should not be a problem.  However, this supplement can have other side effects and the patient is strongly encouraged to consult with her PCP.  Pt voiced understanding.

## 2014-04-25 ENCOUNTER — Other Ambulatory Visit (INDEPENDENT_AMBULATORY_CARE_PROVIDER_SITE_OTHER): Payer: Self-pay | Admitting: Surgery

## 2014-04-25 ENCOUNTER — Ambulatory Visit (INDEPENDENT_AMBULATORY_CARE_PROVIDER_SITE_OTHER): Payer: Private Health Insurance - Indemnity | Admitting: Surgery

## 2014-04-25 ENCOUNTER — Encounter (INDEPENDENT_AMBULATORY_CARE_PROVIDER_SITE_OTHER): Payer: Self-pay | Admitting: Surgery

## 2014-04-25 VITALS — BP 124/76 | HR 60 | Temp 98.3°F | Ht 66.0 in | Wt 208.0 lb

## 2014-04-25 DIAGNOSIS — C50919 Malignant neoplasm of unspecified site of unspecified female breast: Secondary | ICD-10-CM

## 2014-04-25 DIAGNOSIS — C50912 Malignant neoplasm of unspecified site of left female breast: Secondary | ICD-10-CM

## 2014-04-25 DIAGNOSIS — Z853 Personal history of malignant neoplasm of breast: Secondary | ICD-10-CM

## 2014-04-25 NOTE — Progress Notes (Signed)
Re:   Joann Page DOB:   19-Jan-1961 MRN:   [id number]  ASSESSMENT AND PLAN: 1.  Left breast cancer, 1 o'clock.  T1c, N0.  Left breast lumpectomy and left axillary SLNBx - 07/17/2012  Final path: 1.7 cm IDC, 0/4 nodes, margins clear, ER - 100%, PR - 31%, Her2Neu - neg, Ki-67 - 40%  Oncotype DX - 29, 19% recurrence rate.    Medical and radiation oncology - Drs. Humphrey Rolls Truddie Coco) and [place].  Finished radiation tx 11/11/2012.  On tamoxifen.  Disease free.  I will see her back in 6 months.  1a. Genetics - Neg BRCA - 09/19/2012   2.  Morbid obesity.  She had thought about weight loss surgery in 2011/02/28, but decided not to go through with a lap band. 3.  Arthritis of the knees, see Dr. Roney Marion 4.  Hyperlipidemia.  Chief Complaint  Patient presents with  . Breast Cancer Long Term Follow Up   REFERRING PHYSICIAN:  Dr. Glee Arvin  HISTORY OF PRESENT ILLNESS: Joann Page is a 53 y.o. (DOB: 06/28/1961)   female whose primary care physician is [facility], MD and comes to follow up of a left breast cancer.  She is doing well.  She does not like breast exams, but knows that they need to be done.  She has no area of concern.  She is doing better on Tamoxifen. She will be one more semester with her school - so she finished Spring 2016.  Breast History: She had an abnormality identified on mammogram and underwent 2 left core biopsies.  The first, 06/30/2012, was negative.  The second, 07/03/2012, showed invasive ductal ca. She has no prior history of breast abnormalities.  She had a hysterectomy in 28-Feb-2000 for fibroids.  Still has her ovaries and is no hormone meds. Her sister, Shapelle's mother, had breast cancer in her late 2023/02/28 and died of breast cancer.  This is the only family member with breast cancer.   Past Medical History  Diagnosis Date  . Knee pain, right   . Obesity   . Cancer 07/07/12    Left Breast Inv Ductal Ca.  . No pertinent past medical history   . H/O hiatal hernia     small  .  Hyperlipidemia   . Status post chemotherapy comp 10/27/12     4 CyclesTaxotere/ Cytoxan  . Breast cancer 07/17/12    at age 44, ER/PR +, hER 2 -  . Asthma   . Arthritis February 27, 2009    lumbar spine  . Degenerative disk disease 02/27/2009    Lumbar spine  . GERD (gastroesophageal reflux disease)     hx  . S/P radiation therapy 11/30/12 - 01/12/13    Left Breast / 50 gray in 25 Fractions with boost 10 gray in 5 Fractions  . Status post chemotherapy Comp. 01/12/13    neoadjuvant chemotherapy: 4 Cycles of Taxotere/Cytoxan  . Use of tamoxifen (Nolvadex) 01/18/13    Current Outpatient Prescriptions  Medication Sig Dispense Refill  . famotidine (PEPCID) 10 MG tablet Take 10 mg by mouth at bedtime as needed.       . Glucosamine-Chondroitin-MSM 500-200-150 MG TABS Take 1 tablet by mouth 3 (three) times daily.      Marland Kitchen HYDROcodone-acetaminophen (NORCO/VICODIN) 5-325 MG per tablet Take 1 tablet by mouth every 4 (four) hours as needed (4- 6 hours prn). pain  30 tablet  1  . ibuprofen (ADVIL,MOTRIN) 200 MG tablet Take 400 mg by mouth every 6 (six)  hours as needed. pain      . tamoxifen (NOLVADEX) 20 MG tablet TAKE 1 TABLET BY MOUTH ONCE DAILY  90 tablet  1   No current facility-administered medications for this visit.    Allergies  Allergen Reactions  . Sulfa Antibiotics Shortness Of Breath and Swelling    REVIEW OF SYSTEMS: Musculoskeletal:  Sees Dr. Roney Marion for knee pain/arthritis.  Has never had surgery.  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  Hyman Bible has since left our office - April 2015.  She is now at [employer]. She is married and has one daughter 4 YO. She does not work. 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  She is delayed a semester, so she'll finish spring 2016.  PHYSICAL EXAM: BP 124/76  Pulse 60  Temp(Src) 98.3 F (36.8 C)  Ht $R'5\' 6"'mI$  (1.676 m)  Wt 208 lb (94.348 kg)  BMI 33.59 kg/m2  HEENT:  Pupils equal.  Dentition good. NECK:  Supple.   No thyroid mass. LYMPH NODES:  No cervical, supraclavicular, or axillary adenopathy. BREASTS -  RIGHT:  No palpable mass or nodule.  No nipple discharge.   LEFT:  Scar UOQ of left breast.  Some tenderness on palpation of the breast.  No palpable mass or nodule.  No nipple discharge. UPPER EXTREMITIES:  No evidence of lymphedema.  DATA REVIEWED: Last mammogram ([facility]) - 07/02/2013 - normal  Alphonsa Overall, MD,  [facility] [address] [place]  [place], [place]    [place] Phone:  [phone] FAX:  [phone]

## 2014-06-05 ENCOUNTER — Telehealth: Payer: Self-pay | Admitting: Hematology and Oncology

## 2014-06-05 NOTE — Telephone Encounter (Signed)
, °

## 2014-06-10 ENCOUNTER — Ambulatory Visit: Payer: 59 | Admitting: Oncology

## 2014-06-10 ENCOUNTER — Other Ambulatory Visit: Payer: 59

## 2014-06-20 ENCOUNTER — Other Ambulatory Visit: Payer: Self-pay | Admitting: Obstetrics and Gynecology

## 2014-06-21 LAB — CYTOLOGY - PAP

## 2014-06-24 ENCOUNTER — Encounter: Payer: Self-pay | Admitting: Internal Medicine

## 2014-07-26 ENCOUNTER — Encounter (INDEPENDENT_AMBULATORY_CARE_PROVIDER_SITE_OTHER): Payer: Self-pay

## 2014-07-29 ENCOUNTER — Encounter (INDEPENDENT_AMBULATORY_CARE_PROVIDER_SITE_OTHER): Payer: Self-pay

## 2014-08-13 ENCOUNTER — Encounter: Payer: Self-pay | Admitting: Internal Medicine

## 2014-08-13 ENCOUNTER — Ambulatory Visit (INDEPENDENT_AMBULATORY_CARE_PROVIDER_SITE_OTHER): Payer: Managed Care, Other (non HMO) | Admitting: Internal Medicine

## 2014-08-13 VITALS — BP 120/82 | HR 68 | Ht 66.0 in | Wt 214.8 lb

## 2014-08-13 DIAGNOSIS — K219 Gastro-esophageal reflux disease without esophagitis: Secondary | ICD-10-CM

## 2014-08-13 MED ORDER — SUCRALFATE 1 GM/10ML PO SUSP: 1.0000 g | Freq: Two times a day (BID) | ORAL | Status: DC

## 2014-08-13 NOTE — Progress Notes (Signed)
Joann Page 20-May-1961 [id number]  Note: This dictation was prepared with Dragon digital system. Any transcriptional errors that result from this procedure are unintentional.   History of Present Illness:  This is a 53 year old African American female with a history of breast cancer who has episodes of heartburn and gastroesophageal reflux. This occurs about once a month and she takes Pepcid 10 mg or her brothers Carafate slurry which seems to help. She denies dysphagia to solids or liquids. We did a colonoscopy in July 2012 which was incomplete due to tortuosity and redundancy of the colon. A subsequent barium enema was essentially normal. She had an upper GI series in January 2013 as part of  preop evaluation for lapband surgery and she was found to have a small sliding hiatal hernia and marked esophageal reflux up to the level of cervical esophagus.    Past Medical History  Diagnosis Date  . Knee pain, right   . Obesity   . Cancer 07/07/12    Left Breast Inv Ductal Ca.  . No pertinent past medical history   . H/O hiatal hernia     small  . Hyperlipidemia   . Status post chemotherapy comp 10/27/12     4 CyclesTaxotere/ Cytoxan  . Breast cancer 07/17/12    at age 77, ER/PR +, hER 2 -  . Asthma   . 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 Degenerative disk disease 2010    Lumbar spine  . GERD (gastroesophageal reflux disease)     hx  . S/P radiation therapy 11/30/12 - 01/12/13    Left Breast / 50 gray in 25 Fractions with boost 10 gray in 5 Fractions  . Status post chemotherapy Comp. 01/12/13    neoadjuvant chemotherapy: 4 Cycles of Taxotere/Cytoxan  . Use of tamoxifen (Nolvadex) 01/18/13    Past Surgical History  Procedure Laterality Date  . Breath tek h pylori  12/07/2011    Procedure: BREATH TEK H PYLORI;  Surgeon: Shann Medal, MD;  Location: [facility];  Service: Endoscopy;  Laterality: N/A;  . Needle core biopsy  06/30/12    Left Breast: 12 O'clock/ Benign Parenchyma  . 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  . Abdominal hysterectomy  04/2000    partial, fibroids    Allergies  Allergen Reactions  . Sulfa Antibiotics Shortness Of Breath and Swelling    Family history and social history have been reviewed.  Review of Systems: Negative for dysphagia odynophagia  The remainder of the 10 point ROS is negative except as outlined in the H&P  Physical Exam: General Appearance Well developed, in no distress Eyes  Non icteric  HEENT  Non traumatic, normocephalic  Mouth No lesion, tongue papillated, no cheilosis Neck Supple without adenopathy, thyroid not enlarged, no carotid bruits, no JVD Lungs Clear to auscultation bilaterally COR Normal S1, normal S2, regular rhythm, no murmur, quiet precordium Abdomen soft with minimal tenderness in the subxiphoid area. No distention. Normal active bowel sounds Rectal not done Extremities  No pedal edema Skin No lesions Neurological Alert and oriented x 3 Psychological Normal mood and affect  Assessment and Plan:   Problem #75 53 year old [place] American female with episodic gastroesophageal reflux and evidence of marked reflux on an upper GI  series 2 years ago. She does not want to take medication everyday but is willing to undergo an upper endoscopy to assess her for the possibility of Barrett's esophagus or hiatal hernia. Depending on the results, she may be a candidate for everyday acid reducing therapy. For now, I asked her to purchase Prilosec 20 mg over the counter and take 2 tablets at a time if she develops heartburn or epigastric discomfort.  Problem #2 Colorectal screening. Her last colonoscopy and barium enema were in July 2012. Her next exam is due in July 2022 with  propofol.    Joann Page 08/13/2014

## 2014-08-13 NOTE — Patient Instructions (Signed)
You have been scheduled for an endoscopy. Please follow written instructions given to you at your visit today. If you use inhalers (even only as needed), please bring them with you on the day of your procedure. Your physician has requested that you go to www.startemmi.com and enter the access code given to you at your visit today. This web site gives a general overview about your procedure. However, you should still follow specific instructions given to you by our office regarding your preparation for the procedure.  Please purchase omeprazole 20 mg over the counter and take 2 tablets once daily as needed.  We have sent the following medications to your pharmacy for you to pick up at your convenience: carafate 10 ml twice daily  CC:Dr A Harrington Challenger

## 2014-08-14 ENCOUNTER — Ambulatory Visit (AMBULATORY_SURGERY_CENTER): Payer: Managed Care, Other (non HMO) | Admitting: Internal Medicine

## 2014-08-14 ENCOUNTER — Encounter: Payer: Self-pay | Admitting: Internal Medicine

## 2014-08-14 VITALS — BP 135/82 | HR 69 | Temp 97.8°F | Resp 19 | Ht 66.0 in | Wt 214.0 lb

## 2014-08-14 DIAGNOSIS — K219 Gastro-esophageal reflux disease without esophagitis: Secondary | ICD-10-CM

## 2014-08-14 DIAGNOSIS — K2100 Gastro-esophageal reflux disease with esophagitis, without bleeding: Secondary | ICD-10-CM

## 2014-08-14 MED ORDER — SODIUM CHLORIDE 0.9 % IV SOLN: 500.0000 mL | INTRAVENOUS | Status: DC

## 2014-08-14 MED ORDER — OMEPRAZOLE 20 MG PO CPDR: 20.0000 mg | DELAYED_RELEASE_CAPSULE | Freq: Every day | ORAL | Status: DC

## 2014-08-14 NOTE — Patient Instructions (Signed)
Impressions/recommendations:  Esophagitis (handout given) Hiatal hernia (handout given) Nonobstructing hiatal hernia  Prilosec 20 mg every morning  YOU HAD AN ENDOSCOPIC PROCEDURE TODAY AT [facility]: Refer to the procedure report that was given to you for any specific questions about what was found during the examination.  If the procedure report does not answer your questions, please call your gastroenterologist to clarify.  If you requested that your care partner not be given the details of your procedure findings, then the procedure report has been included in a sealed envelope for you to review at your convenience later.  YOU SHOULD EXPECT: Some feelings of bloating in the abdomen. Passage of more gas than usual.  Walking can help get rid of the air that was put into your GI tract during the procedure and reduce the bloating. If you had a lower endoscopy (such as a colonoscopy or flexible sigmoidoscopy) you may notice spotting of blood in your stool or on the toilet paper. If you underwent a bowel prep for your procedure, then you may not have a normal bowel movement for a few days.  DIET: Your first meal following the procedure should be a light meal and then it is ok to progress to your normal diet.  A half-sandwich or bowl of soup is an example of a good first meal.  Heavy or fried foods are harder to digest and may make you feel nauseous or bloated.  Likewise meals heavy in dairy and vegetables can cause extra gas to form and this can also increase the bloating.  Drink plenty of fluids but you should avoid alcoholic beverages for 24 hours.  ACTIVITY: Your care partner should take you home directly after the procedure.  You should plan to take it easy, moving slowly for the rest of the day.  You can resume normal activity the day after the procedure however you should NOT DRIVE or use heavy machinery for 24 hours (because of the sedation medicines used during the test).     SYMPTOMS TO REPORT IMMEDIATELY: A gastroenterologist can be reached at any hour.  During normal business hours, 8:30 AM to 5:00 PM Monday through Friday, call [phone].  After hours and on weekends, please call the GI answering service at [phone] who will take a message and have the physician on call contact you.    Following upper endoscopy (EGD)  Vomiting of blood or coffee ground material  New chest pain or pain under the shoulder blades  Painful or persistently difficult swallowing  New shortness of breath  Fever of 100F or higher  Black, tarry-looking stools  FOLLOW UP: If any biopsies were taken you will be contacted by phone or by letter within the next 1-3 weeks.  Call your gastroenterologist if you have not heard about the biopsies in 3 weeks.  Our staff will call the home number listed on your records the next business day following your procedure to check on you and address any questions or concerns that you may have at that time regarding the information given to you following your procedure. This is a courtesy call and so if there is no answer at the home number and we have not heard from you through the emergency physician on call, we will assume that you have returned to your regular daily activities without incident.  SIGNATURES/CONFIDENTIALITY: You and/or your care partner have signed paperwork which will be entered into your electronic medical record.  These signatures attest to the  fact that that the information above on your After Visit Summary has been reviewed and is understood.  Full responsibility of the confidentiality of this discharge information lies with you and/or your care-partner.

## 2014-08-14 NOTE — Progress Notes (Signed)
Called to room to assist during endoscopic procedure.  Patient ID and intended procedure confirmed with present staff. Received instructions for my participation in the procedure from the performing physician.  

## 2014-08-14 NOTE — Progress Notes (Signed)
Report to PACU, RN, vss, BBS= Clear.  

## 2014-08-14 NOTE — Op Note (Signed)
[place]  [address]. [place], [address]   ENDOSCOPY PROCEDURE REPORT  PATIENT: Joann Page, Joann Page  MR#: [id number] BIRTHDATE: 01-20-1961 , 53  yrs. old GENDER: Female ENDOSCOPIST: Lafayette Dragon, MD REFERRED BY:  Dr Melinda Crutch PROCEDURE DATE:  08/14/2014 PROCEDURE:  EGD w/ biopsy ASA CLASS:     Class II INDICATIONS:  Heartburn.   History of esophageal reflux.,upper GI series showed reflux to the level of the cervical esophagus MEDICATIONS: MAC sedation, administered by CRNA and Propofol (Diprivan) 220 mg IV TOPICAL ANESTHETIC: none  DESCRIPTION OF PROCEDURE: After the risks benefits and alternatives of the procedure were thoroughly explained, informed consent was obtained.  The LB [id number] endoscope was introduced through the mouth and advanced to the second portion of the duodenum. Without limitations.  The instrument was slowly withdrawn as the mucosa was fully examined.      [Esophagus: proximal and mid-esophageal mucosa appeared normal. In the distal esophagus there were at least 3 linear erosions covered with exudates which extended over a length of 15-20 mm. There was a benign-appearing nonobstructing esophageal stricture at the level of fall 34 centimeters from the incisors. Multiple biopsies were obtained to rule out dysplasia. Esophagitis was consistent with grade 2 Stomach: the gastric mucosa was normal. The rugal pattern and gastric antrum were unremarkable. Gastric outlet was normal. Retroflexion of the endoscope revealing normal fundus and cardia. There was a 3 cm hiatal hernia extending from 36-33 cm 2 of these from the incisors Duodenum: duodenal bulb and descending duodenum was normal The scope was then withdrawn from the patient and the procedure completed.  COMPLICATIONS: There were no complications. ENDOSCOPIC IMPRESSION:  grade 2 reflux esophagitis. Status post biopsies 3 cm hiatal hernia Nonobstructing mild distal  esophageal stricture not dilated  RECOMMENDATIONS: antireflux measures Prilosec 20 mg every morning Await results of the pathology Recall upper endoscopy pending path report   REPEAT EXAM:  eSigned:  Lafayette Dragon, MD 08/14/2014 9:55 AM   CC:  PATIENT NAME:  Joann Page, Joann Page MR#: [id number]

## 2014-08-14 NOTE — Progress Notes (Signed)
Patient denies any allergies to eggs or soy. Patient denies any problems with anesthesia/sedation. Patient denies any oxygen use at home and does not take any diet/weight loss medications.  

## 2014-08-15 ENCOUNTER — Telehealth: Payer: Self-pay | Admitting: *Deleted

## 2014-08-15 NOTE — Telephone Encounter (Signed)
  Follow up Call-  Call back number 08/14/2014  Post procedure Call Back phone  # [phone]  Permission to leave phone message Yes     Patient questions:  Do you have a fever, pain , or abdominal swelling? No. Pain Score  0 *  Have you tolerated food without any problems? Yes.    Have you been able to return to your normal activities? Yes.    Do you have any questions about your discharge instructions: Diet   No. Medications  No. Follow up visit  No.  Do you have questions or concerns about your Care? No.  Actions: * If pain score is 4 or above: No action needed, pain <4.

## 2014-08-20 ENCOUNTER — Encounter: Payer: Self-pay | Admitting: Internal Medicine

## 2014-08-21 ENCOUNTER — Encounter: Payer: Self-pay | Admitting: *Deleted

## 2014-09-02 ENCOUNTER — Other Ambulatory Visit: Payer: Self-pay

## 2014-09-02 DIAGNOSIS — C50412 Malignant neoplasm of upper-outer quadrant of left female breast: Secondary | ICD-10-CM

## 2014-09-03 ENCOUNTER — Ambulatory Visit (HOSPITAL_BASED_OUTPATIENT_CLINIC_OR_DEPARTMENT_OTHER): Payer: Managed Care, Other (non HMO) | Admitting: Hematology and Oncology

## 2014-09-03 ENCOUNTER — Telehealth: Payer: Self-pay | Admitting: Hematology and Oncology

## 2014-09-03 ENCOUNTER — Other Ambulatory Visit (HOSPITAL_BASED_OUTPATIENT_CLINIC_OR_DEPARTMENT_OTHER): Payer: Managed Care, Other (non HMO)

## 2014-09-03 VITALS — BP 151/88 | HR 89 | Temp 98.1°F | Resp 18 | Ht 66.0 in | Wt 219.0 lb

## 2014-09-03 DIAGNOSIS — C50412 Malignant neoplasm of upper-outer quadrant of left female breast: Secondary | ICD-10-CM

## 2014-09-03 DIAGNOSIS — Z79810 Long term (current) use of selective estrogen receptor modulators (SERMs): Secondary | ICD-10-CM

## 2014-09-03 LAB — COMPREHENSIVE METABOLIC PANEL (CC13)
ALT: 19 U/L (ref 0–55)
ANION GAP: 6 meq/L (ref 3–11)
AST: 17 U/L (ref 5–34)
Albumin: 3.3 g/dL — ABNORMAL LOW (ref 3.5–5.0)
Alkaline Phosphatase: 50 U/L (ref 40–150)
BUN: 11.3 mg/dL (ref 7.0–26.0)
CHLORIDE: 109 meq/L (ref 98–109)
CO2: 26 meq/L (ref 22–29)
Calcium: 9.2 mg/dL (ref 8.4–10.4)
Creatinine: 0.8 mg/dL (ref 0.6–1.1)
Glucose: 111 mg/dl (ref 70–140)
Potassium: 3.7 mEq/L (ref 3.5–5.1)
SODIUM: 141 meq/L (ref 136–145)
TOTAL PROTEIN: 7 g/dL (ref 6.4–8.3)

## 2014-09-03 LAB — CBC WITH DIFFERENTIAL/PLATELET
BASO%: 0.5 % (ref 0.0–2.0)
Basophils Absolute: 0 10*3/uL (ref 0.0–0.1)
EOS%: 2.4 % (ref 0.0–7.0)
Eosinophils Absolute: 0.1 10*3/uL (ref 0.0–0.5)
HCT: 39 % (ref 34.8–46.6)
HGB: 12.4 g/dL (ref 11.6–15.9)
LYMPH#: 1.7 10*3/uL (ref 0.9–3.3)
LYMPH%: 35.5 % (ref 14.0–49.7)
MCH: 27.9 pg (ref 25.1–34.0)
MCHC: 31.8 g/dL (ref 31.5–36.0)
MCV: 87.5 fL (ref 79.5–101.0)
MONO#: 0.3 10*3/uL (ref 0.1–0.9)
MONO%: 5.3 % (ref 0.0–14.0)
NEUT#: 2.6 10*3/uL (ref 1.5–6.5)
NEUT%: 56.3 % (ref 38.4–76.8)
Platelets: 220 10*3/uL (ref 145–400)
RBC: 4.46 10*6/uL (ref 3.70–5.45)
RDW: 12.1 % (ref 11.2–14.5)
WBC: 4.7 10*3/uL (ref 3.9–10.3)

## 2014-09-03 NOTE — Telephone Encounter (Signed)
per pof to sch pt appt-pt stated will sch own mamma @ [facility]-gave pt copy of sch

## 2014-09-03 NOTE — Assessment & Plan Note (Signed)
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 She does have hot flashes intermittently.  Surveillance: I reviewed the ultrasound and mammogram done in August 2015 which showed a small nodule; position left breast which is felt to be a lymph node. A six-month followup mammogram is recommended which have ordered today. Today's breast exam did not reveal any lumps or nodules in the breast. Patient got very uncomfortable with a breast exam. She reports that she is always uncomfortable when anybody does breast exam.  Survivorship: Encouraged her to stay active and do physical exercise is 6 days a week for at least 30 minutes. I encouraged her to increase her fruits and vegetables as well as to eat less red meat.

## 2014-09-03 NOTE — Progress Notes (Signed)
Patient Care Team: Gus Height, MD as PCP - General (Obstetrics and Gynecology) Berle Mull, MD as Consulting Physician (Family Medicine) Lafayette Dragon, MD as Consulting Physician (Gastroenterology) Bethann Goo Himmelrich, RD as Dietitian  DIAGNOSIS: 53 year old female with stage I emphasis T1, N0, M0) ER/PR positive HER-2/neu equivocal invasive ductal carcinoma grade 3 left upper outer quadrant diagnosed September 2013.   PRIOR THERAPY:  #53 y.o. female who was found on screening mammography to have an abnormality in her left breast. This was in the 12:00 position. Ultrasound measured the lesion to be 1.2 cm greatest dimension. Initial biopsy was nondiagnostic. A stereotactic biopsy on 07/03/2012 showed invasive ductal carcinoma.RI of her breasts was performed on a 913. This demonstrated a 2.3 x 1.8 x 1.6 cm enhancing mass with irregular margins at the 12:00 position of the left breast. There are no additional masses or areas of enhancement that were suspicious in either breast. There were no abnormal appearing lymph nodes  #2 She went on to undergo a lumpectomy and sentinel lymph node biopsy on 07/17/2012. All 4 sentinel lymph nodes were negative. The tumor measured 1.7 cm. The margins are clear. There is no LVSI. 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 Therefore she was treated adjuvantly with Taxotere Cytoxan every 3 weeks for a total of 4 cycles.  #5 she then went on to receive radiation therapyfrom 11/30/2012 through02/14/2014.  #6 adjuvant tamoxifen 20 mg daily for 10 years. I explained the rationale for treatment with tamoxifen. Risks and benefits of tamoxifen were discussed with her. She will begin this 01/18/2013.   CURRENT THERAPY:  tamoxifen 20 mg daily  CHIEF COMPLIANT: Six-month followup of history of breast cancer  INTERVAL HISTORY: Joann Page is a 53 year old Caucasian with above-mentioned history of left breast cancer diagnosed screening mammogram she underwent lumpectomy of by systemic chemotherapy followed by radiation and is currently on tamoxifen daily since February 2014. She wants to know how long she needs to take tamoxifen as a fibrous is 10 years. She denies any lumps or nodules but on a recent mammogram she was noted to have a 7 mm nodule in the left breast for which she is scheduled to have another mammogram in 6 months. She denies any major problems tolerating tamoxifen.  REVIEW OF SYSTEMS:   Constitutional: Denies fevers, chills or abnormal weight loss Eyes: Denies blurriness of vision Ears, nose, mouth, throat, and face: Denies mucositis or sore throat Respiratory: Denies cough, dyspnea or wheezes Cardiovascular: Denies palpitation, chest discomfort or lower extremity swelling Gastrointestinal:  Denies nausea, heartburn or change in bowel habits Skin: Denies abnormal skin rashes Lymphatics: Denies new lymphadenopathy or easy bruising Neurological:Denies numbness, tingling or new weaknesses Behavioral/Psych: Mood is stable, no new changes  Breast:  denies any pain or lumps or nodules in either breasts, slightly uncomfortable in the breast. All other systems were reviewed with the patient and are negative.  I have reviewed the past medical history, past surgical history, social history and family history with the patient and they are unchanged from previous note.  ALLERGIES:  is allergic to sulfa antibiotics.  MEDICATIONS:  Current Outpatient Prescriptions  Medication Sig Dispense Refill  . esomeprazole (NEXIUM) 20 MG capsule Take 20 mg by mouth daily at 12 noon.      Marland Kitchen  famotidine (PEPCID) 10 MG tablet Take 10 mg by mouth at bedtime as needed.       . Glucosamine-Chondroitin-MSM 500-200-150 MG  TABS Take 1 tablet by mouth 3 (three) times daily.      Marland Kitchen HYDROcodone-acetaminophen (NORCO/VICODIN) 5-325 MG per tablet Take 1 tablet by mouth every 4 (four) hours as needed (4- 6 hours prn). pain  30 tablet  1  . ibuprofen (ADVIL,MOTRIN) 200 MG tablet Take 400 mg by mouth every 6 (six) hours as needed. pain      . sucralfate (CARAFATE) 1 GM/10ML suspension Take 10 mLs (1 g total) by mouth 2 (two) times daily.  420 mL  0  . tamoxifen (NOLVADEX) 20 MG tablet TAKE 1 TABLET BY MOUTH ONCE DAILY  90 tablet  1   No current facility-administered medications for this visit.    PHYSICAL EXAMINATION: ECOG PERFORMANCE STATUS: 1 - Symptomatic but completely ambulatory  Filed Vitals:   09/03/14 1410  BP: 151/88  Pulse: 89  Temp: 98.1 F (36.7 C)  Resp: 18   Filed Weights   09/03/14 1410  Weight: 219 lb (99.338 kg)    GENERAL:alert, no distress and comfortable SKIN: skin color, texture, turgor are normal, no rashes or significant lesions EYES: normal, Conjunctiva are pink and non-injected, sclera clear OROPHARYNX:no exudate, no erythema and lips, buccal mucosa, and tongue normal  NECK: supple, thyroid normal size, non-tender, without nodularity LYMPH:  no palpable lymphadenopathy in the cervical, axillary or inguinal LUNGS: clear to auscultation and percussion with normal breathing effort HEART: regular rate & rhythm and no murmurs and no lower extremity edema ABDOMEN:abdomen soft, non-tender and normal bowel sounds Musculoskeletal:no cyanosis of digits and no clubbing  NEURO: alert & oriented x 3 with fluent speech, no focal motor/sensory deficits BREAST: No palpable masses or nodules in either right or left breasts. No palpable axillary supraclavicular or infraclavicular adenopathy no breast tenderness or nipple discharge.   LABORATORY DATA:  I have reviewed the data as listed   Chemistry      Component Value Date/Time   NA 141 09/03/2014 1316   NA 140 11/24/2011 1625   K 3.7  09/03/2014 1316   K 3.8 11/24/2011 1625   CL 108* 04/25/2013 1340   CL 106 11/24/2011 1625   CO2 26 09/03/2014 1316   CO2 26 11/24/2011 1625   BUN 11.3 09/03/2014 1316   BUN 9 11/24/2011 1625   CREATININE 0.8 09/03/2014 1316   CREATININE 0.79 11/24/2011 1625   CREATININE 0.72 05/02/2011 2018      Component Value Date/Time   CALCIUM 9.2 09/03/2014 1316   CALCIUM 9.0 11/24/2011 1625   ALKPHOS 50 09/03/2014 1316   ALKPHOS 43 11/24/2011 1625   AST 17 09/03/2014 1316   AST 16 11/24/2011 1625   ALT 19 09/03/2014 1316   ALT 19 11/24/2011 1625   BILITOT <0.20 09/03/2014 1316   BILITOT 0.2* 11/24/2011 1625       Lab Results  Component Value Date   WBC 4.7 09/03/2014   HGB 12.4 09/03/2014   HCT 39.0 09/03/2014   MCV 87.5 09/03/2014   PLT 220 09/03/2014   NEUTROABS 2.6 09/03/2014     RADIOGRAPHIC STUDIES: I have personally reviewed the radiology reports and agreed with their findings. No results found.   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 She does have hot flashes intermittently.  Surveillance: I reviewed the ultrasound and mammogram done in August 2015 which showed a small nodule; position left breast which is felt to be a lymph node. A six-month followup mammogram is recommended which have ordered today. Today's breast exam did not reveal any lumps or nodules in the breast. Patient got very uncomfortable with a breast exam. She reports that she is always uncomfortable when anybody does breast exam.  Survivorship: Encouraged her to stay active and do physical exercise is 6 days a week for at least 30 minutes. I encouraged her  to increase her fruits and vegetables as well as to eat less red meat.   Orders Placed This Encounter  Procedures  . MM Digital Diagnostic Unilat L    Standing Status: Future     Number of Occurrences:      Standing Expiration Date: 09/03/2015    Order Specific Question:  Reason for Exam (SYMPTOM  OR DIAGNOSIS REQUIRED)    Answer:  H/O left breast cancer. New nodule in left breast    Order Specific Question:  Is the patient pregnant?    Answer:  No    Order Specific Question:  Preferred imaging location?    Answer:  External     Comments:  [facility]   The patient has a good understanding of the overall plan. she agrees with it. She will call with any problems that may develop before her next visit here.  I spent 20 minutes counseling the patient face to face. The total time spent in the appointment was 25 minutes and more than 50% was on counseling and review of test results    Rulon Eisenmenger, MD 09/03/2014 2:59 PM

## 2014-09-18 ENCOUNTER — Other Ambulatory Visit: Payer: Self-pay | Admitting: Oncology

## 2014-09-18 DIAGNOSIS — C50419 Malignant neoplasm of upper-outer quadrant of unspecified female breast: Secondary | ICD-10-CM

## 2014-09-30 ENCOUNTER — Encounter: Payer: Self-pay | Admitting: Internal Medicine

## 2015-02-10 ENCOUNTER — Telehealth: Payer: Self-pay | Admitting: *Deleted

## 2015-02-10 ENCOUNTER — Other Ambulatory Visit: Payer: Self-pay | Admitting: Hematology and Oncology

## 2015-02-10 DIAGNOSIS — C50912 Malignant neoplasm of unspecified site of left female breast: Secondary | ICD-10-CM

## 2015-02-10 NOTE — Telephone Encounter (Signed)
Patient called to say [place] weekend.  She had about a month's worth in the bottle.  She needs an early refill.

## 2015-03-10 ENCOUNTER — Ambulatory Visit: Payer: Managed Care, Other (non HMO) | Admitting: Hematology and Oncology

## 2015-03-10 ENCOUNTER — Telehealth: Payer: Self-pay | Admitting: Hematology and Oncology

## 2015-03-10 NOTE — Telephone Encounter (Signed)
Patient called to reschedule appointment to a Mon/Wed/Fri afternoon. Next available May 2. Patient confirmed, mailed calendar.

## 2015-03-30 NOTE — Assessment & Plan Note (Signed)
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 She does have hot flashes intermittently.  Breast Cancer Surveillance: 1. Breast exam 03/31/15: Normal 2. Mammogram 07/03/14 No abnormalities.   Survivorship: Discussed the importance of physical exercise in decreasing the likelihood of breast cancer recurrence. Recommended 30 mins daily 6 days a week of either brisk walking or cycling or swimming. Encouraged patient to eat more fruits and vegetables and decrease red meat.

## 2015-03-31 ENCOUNTER — Ambulatory Visit (HOSPITAL_BASED_OUTPATIENT_CLINIC_OR_DEPARTMENT_OTHER): Payer: BLUE CROSS/BLUE SHIELD | Admitting: Hematology and Oncology

## 2015-03-31 VITALS — BP 126/82 | HR 86 | Temp 98.1°F | Resp 18 | Ht 66.0 in | Wt 214.6 lb

## 2015-03-31 DIAGNOSIS — Z79810 Long term (current) use of selective estrogen receptor modulators (SERMs): Secondary | ICD-10-CM | POA: Diagnosis not present

## 2015-03-31 DIAGNOSIS — Z853 Personal history of malignant neoplasm of breast: Secondary | ICD-10-CM | POA: Diagnosis not present

## 2015-03-31 DIAGNOSIS — C50912 Malignant neoplasm of unspecified site of left female breast: Secondary | ICD-10-CM

## 2015-03-31 NOTE — Progress Notes (Signed)
Patient Care Team: Harle Battiest, MD as PCP - General (Obstetrics and Gynecology) Berle Mull, MD as Consulting Physician (Family Medicine) Lafayette Dragon, MD as Consulting Physician (Gastroenterology) Bryson Ha Himmelrich, RD as Dietitian  DIAGNOSIS: No matching staging information was found for the patient.  SUMMARY OF ONCOLOGIC HISTORY:   Breast cancer, left   07/03/2012 Initial Diagnosis Left: IDC, MRI 2.3 cm mass   07/17/2012 Surgery Left Lumpectomy: 1.7 cm, IDC, High grade, 4 SN neg,  with HG DCIS with necrosis, Oncotype Dx Intermed grade   07/31/2012 - 11/23/2012 Chemotherapy Adjuvant chemo TC x 4   11/30/2012 - 01/12/2013 Radiation Therapy Adjuvant XRT   01/18/2013 -  Anti-estrogen oral therapy Adjuvant  tamoxifen 10 years    CHIEF COMPLIANT:  Follow-up of breast cancer  INTERVAL HISTORY: Joann Page is a  54 year old lady with above-mentioned history of left-sided breast cancer treated with lumpectomy adjuvant chemotherapy adjuvant radiation and is now on antiestrogen therapy tamoxifen.  She is tolerating tamoxifen extremely well except for hot flashes. She denies any lumps or nodules in breast. She has mammograms every  6 months because it was not dramatically but they're following very closely. Previously was biopsied and was negative.  REVIEW OF SYSTEMS:   Constitutional: Denies fevers, chills or abnormal weight loss Eyes: Denies blurriness of vision Ears, nose, mouth, throat, and face: Denies mucositis or sore throat Respiratory: Denies cough, dyspnea or wheezes Cardiovascular: Denies palpitation, chest discomfort or lower extremity swelling Gastrointestinal:  Denies nausea, heartburn or change in bowel habits Skin: Denies abnormal skin rashes Lymphatics: Denies new lymphadenopathy or easy bruising Neurological:Denies numbness, tingling or new weaknesses Behavioral/Psych: Mood is stable, no new changes  Breast:  denies any pain or lumps or nodules in either breasts All other  systems were reviewed with the patient and are negative.  I have reviewed the past medical history, past surgical history, social history and family history with the patient and they are unchanged from previous note.  ALLERGIES:  is allergic to sulfa antibiotics.  MEDICATIONS:  Current Outpatient Prescriptions  Medication Sig Dispense Refill  . HYDROcodone-acetaminophen (NORCO/VICODIN) 5-325 MG per tablet Take 1 tablet by mouth every 4 (four) hours as needed (4- 6 hours prn). pain 30 tablet 1  . tamoxifen (NOLVADEX) 20 MG tablet TAKE 1 TABLET BY MOUTH ONCE DAILY 90 tablet 1  . esomeprazole (NEXIUM) 20 MG capsule Take 20 mg by mouth daily at 12 noon.    Marland Kitchen ibuprofen (ADVIL,MOTRIN) 200 MG tablet Take 400 mg by mouth every 6 (six) hours as needed. pain     No current facility-administered medications for this visit.    PHYSICAL EXAMINATION: ECOG PERFORMANCE STATUS: 0 - Asymptomatic  Filed Vitals:   03/31/15 1406  BP: 126/82  Pulse: 86  Temp: 98.1 F (36.7 C)  Resp: 18   Filed Weights   03/31/15 1406  Weight: 214 lb 9.6 oz (97.342 kg)    GENERAL:alert, no distress and comfortable SKIN: skin color, texture, turgor are normal, no rashes or significant lesions EYES: normal, Conjunctiva are pink and non-injected, sclera clear OROPHARYNX:no exudate, no erythema and lips, buccal mucosa, and tongue normal  NECK: supple, thyroid normal size, non-tender, without nodularity LYMPH:  no palpable lymphadenopathy in the cervical, axillary or inguinal LUNGS: clear to auscultation and percussion with normal breathing effort HEART: regular rate & rhythm and no murmurs and no lower extremity edema ABDOMEN:abdomen soft, non-tender and normal bowel sounds Musculoskeletal:no cyanosis of digits and no clubbing  NEURO: alert &  oriented x 3 with fluent speech, no focal motor/sensory deficits BREAST: No palpable masses or nodules in either right or left breasts. No palpable axillary supraclavicular or  infraclavicular adenopathy no breast tenderness or nipple discharge. (exam performed in the presence of a chaperone)  LABORATORY DATA:  I have reviewed the data as listed   Chemistry      Component Value Date/Time   NA 141 09/03/2014 1316   NA 140 11/24/2011 1625   K 3.7 09/03/2014 1316   K 3.8 11/24/2011 1625   CL 108* 04/25/2013 1340   CL 106 11/24/2011 1625   CO2 26 09/03/2014 1316   CO2 26 11/24/2011 1625   BUN 11.3 09/03/2014 1316   BUN 9 11/24/2011 1625   CREATININE 0.8 09/03/2014 1316   CREATININE 0.79 11/24/2011 1625   CREATININE 0.72 05/02/2011 2018      Component Value Date/Time   CALCIUM 9.2 09/03/2014 1316   CALCIUM 9.0 11/24/2011 1625   ALKPHOS 50 09/03/2014 1316   ALKPHOS 43 11/24/2011 1625   AST 17 09/03/2014 1316   AST 16 11/24/2011 1625   ALT 19 09/03/2014 1316   ALT 19 11/24/2011 1625   BILITOT <0.20 09/03/2014 1316   BILITOT 0.2* 11/24/2011 1625       Lab Results  Component Value Date   WBC 4.7 09/03/2014   HGB 12.4 09/03/2014   HCT 39.0 09/03/2014   MCV 87.5 09/03/2014   PLT 220 09/03/2014   NEUTROABS 2.6 09/03/2014   ASSESSMENT & PLAN:  Breast cancer, left 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 She does have hot flashes intermittently.  Breast Cancer Surveillance: 1. Breast exam 03/31/15: Normal 2. Mammogram 07/03/14  Being followed every 6 month mammograms and ultrasounds. Small nodularity that is being watched  Survivorship: Discussed the importance of physical exercise in decreasing the likelihood of breast cancer recurrence. Recommended 30 mins daily 6 days a week of either brisk walking or cycling or swimming. Encouraged patient to eat more fruits and  vegetables and decrease red meat.    return to clinic in 6 months for follow-up  No orders of the defined types were placed in this encounter.   The patient has a good understanding of the overall plan. she agrees with it. she will call with any problems that may develop before the next visit here.   Rulon Eisenmenger, MD

## 2015-04-01 ENCOUNTER — Telehealth: Payer: Self-pay | Admitting: Hematology and Oncology

## 2015-04-01 NOTE — Telephone Encounter (Signed)
Called patient and she is aware of her follow up appointment

## 2015-09-04 ENCOUNTER — Other Ambulatory Visit: Payer: Self-pay | Admitting: Hematology and Oncology

## 2015-09-04 NOTE — Telephone Encounter (Signed)
Chart reviewed.

## 2015-09-10 ENCOUNTER — Telehealth: Payer: Self-pay | Admitting: Internal Medicine

## 2015-09-10 NOTE — Telephone Encounter (Signed)
Called the pharmacy and they stated that the patient has 2 more refills on medication because it was last called in for #90 and they only filled in for 30 so she should have 2 more refills. Patient informed.

## 2015-09-11 ENCOUNTER — Other Ambulatory Visit: Payer: Self-pay | Admitting: Gastroenterology

## 2015-09-11 MED ORDER — OMEPRAZOLE 20 MG PO CPDR: 20.0000 mg | DELAYED_RELEASE_CAPSULE | Freq: Every day | ORAL | Status: DC

## 2015-09-11 NOTE — Telephone Encounter (Signed)
Pharmacy called stating there is no refills on patient's medication. Told them that the pharm tech I talked to yesterday stated that patient had 2 refills left, they stated that the script was out dated. Gave a verbal refill over the phone. Omeprazole 20 mg 1 every day # 30 with 1 refill.

## 2015-10-01 NOTE — Assessment & Plan Note (Deleted)
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 She does have hot flashes intermittently.  Breast Cancer Surveillance: 1. Breast exam 03/31/15: Normal 2. Mammogram 07/03/14 Being followed every 6 month mammograms and ultrasounds. Small nodularity that is being watched  Survivorship: Discussed the importance of physical exercise in decreasing the likelihood of breast cancer recurrence. Recommended 30 mins daily 6 days a week of either brisk walking or cycling or swimming. Encouraged patient to eat more fruits and vegetables and decrease red meat.   return to clinic in 1 year for follow-up

## 2015-10-02 ENCOUNTER — Ambulatory Visit: Payer: BLUE CROSS/BLUE SHIELD | Admitting: Hematology and Oncology

## 2015-10-02 ENCOUNTER — Other Ambulatory Visit: Payer: Self-pay

## 2015-10-02 ENCOUNTER — Telehealth: Payer: Self-pay | Admitting: Hematology and Oncology

## 2015-10-02 NOTE — Telephone Encounter (Signed)
Spoke with patient and she is aware of her new appointment °

## 2015-10-03 ENCOUNTER — Telehealth: Payer: Self-pay | Admitting: Hematology and Oncology

## 2015-10-03 NOTE — Telephone Encounter (Signed)
pt called to r/s appt due to going back to work...done....pt ok adn aware of new d.t

## 2015-10-06 ENCOUNTER — Ambulatory Visit: Payer: BLUE CROSS/BLUE SHIELD | Admitting: Hematology and Oncology

## 2015-11-03 ENCOUNTER — Ambulatory Visit (HOSPITAL_BASED_OUTPATIENT_CLINIC_OR_DEPARTMENT_OTHER): Payer: BLUE CROSS/BLUE SHIELD | Admitting: Hematology and Oncology

## 2015-11-03 ENCOUNTER — Encounter: Payer: Self-pay | Admitting: Hematology and Oncology

## 2015-11-03 ENCOUNTER — Telehealth: Payer: Self-pay | Admitting: Hematology and Oncology

## 2015-11-03 VITALS — BP 143/79 | HR 93 | Temp 97.9°F | Resp 18 | Ht 66.0 in | Wt 222.4 lb

## 2015-11-03 DIAGNOSIS — Z79811 Long term (current) use of aromatase inhibitors: Secondary | ICD-10-CM | POA: Diagnosis not present

## 2015-11-03 DIAGNOSIS — Z853 Personal history of malignant neoplasm of breast: Secondary | ICD-10-CM

## 2015-11-03 DIAGNOSIS — C50512 Malignant neoplasm of lower-outer quadrant of left female breast: Secondary | ICD-10-CM

## 2015-11-03 DIAGNOSIS — K2100 Gastro-esophageal reflux disease with esophagitis, without bleeding: Secondary | ICD-10-CM

## 2015-11-03 NOTE — Addendum Note (Signed)
Addended by: Prentiss Bells on: 11/03/2015 07:45 PM   Modules accepted: Medications

## 2015-11-03 NOTE — Telephone Encounter (Signed)
Called and spoke with patient and she is aware of her follow up

## 2015-11-03 NOTE — Progress Notes (Signed)
Patient Care Team: Harle Battiest, MD as PCP - General (Obstetrics and Gynecology) Berle Mull, MD as Consulting Physician (Family Medicine) Lafayette Dragon, MD as Consulting Physician (Gastroenterology) Bryson Ha Himmelrich, RD as Dietitian  DIAGNOSIS: No matching staging information was found for the patient.  SUMMARY OF ONCOLOGIC HISTORY:   Breast cancer of lower-outer quadrant of left female breast ([place])   07/03/2012 Initial Diagnosis Left: IDC, MRI 2.3 cm mass   07/17/2012 Surgery Left Lumpectomy: 1.7 cm, IDC, High grade, 4 SN neg,  with HG DCIS with necrosis, Oncotype Dx Intermed grade   07/31/2012 - 11/23/2012 Chemotherapy Adjuvant chemo TC x 4   11/30/2012 - 01/12/2013 Radiation Therapy Adjuvant XRT   01/18/2013 -  Anti-estrogen oral therapy Adjuvant  tamoxifen 10 years    CHIEF COMPLIANT:  Follow-up on tamoxifen causing extreme hot flashes  INTERVAL HISTORY: Joann Page is a  54 year old with above-mentioned history of left breast cancer treated with lumpectomy followed by adjuvant chemotherapy radiation and is currently on tamoxifen since February 2014. Her major complaint is hot flashes which seem to be persistent problem. In spite of this she does not want to take any medication for heart flashes. She wants to continue with the tamoxifen for 10 years. She reports that the hot flashes have plateaued somewhat but still extremely uncomfortable.  [facility] had been performing investigations on her with mammogram and ultrasound every 6 months. Most recently was apparently done in August and she was told that she does not need to come back for another year. She is very uncomfortable about the thought of having a lymph node in the breast. She would like to know if that could be removed by surgery. At the same time she is also not [id number] certain that she wants it removed.  REVIEW OF SYSTEMS:   Constitutional: Denies fevers, chills or abnormal weight loss Eyes: Denies blurriness of vision Ears, nose,  mouth, throat, and face: Denies mucositis or sore throat Respiratory: Denies cough, dyspnea or wheezes Cardiovascular: Denies palpitation, chest discomfort or lower extremity swelling Gastrointestinal:  Denies nausea, heartburn or change in bowel habits Skin: Denies abnormal skin rashes Lymphatics: Denies new lymphadenopathy or easy bruising Neurological:Denies numbness, tingling or new weaknesses Behavioral/Psych: Mood is stable, no new changes  Breast:  denies any pain or lumps or nodules in either breasts All other systems were reviewed with the patient and are negative.  I have reviewed the past medical history, past surgical history, social history and family history with the patient and they are unchanged from previous note.  ALLERGIES:  is allergic to sulfa antibiotics.  MEDICATIONS:  Current Outpatient Prescriptions  Medication Sig Dispense Refill  . HYDROcodone-acetaminophen (NORCO/VICODIN) 5-325 MG per tablet Take 1 tablet by mouth every 4 (four) hours as needed (4- 6 hours prn). pain 30 tablet 1  . ibuprofen (ADVIL,MOTRIN) 200 MG tablet Take 400 mg by mouth every 6 (six) hours as needed. pain    . omeprazole (PRILOSEC) 20 MG capsule Take 1 capsule (20 mg total) by mouth daily. 30 capsule 1  . tamoxifen (NOLVADEX) 20 MG tablet TAKE 1 TABLET BY MOUTH ONCE DAILY 90 tablet 1   No current facility-administered medications for this visit.    PHYSICAL EXAMINATION: ECOG PERFORMANCE STATUS: 1 - Symptomatic but completely ambulatory  Filed Vitals:   11/03/15 1529  BP: 143/79  Pulse: 93  Temp: 97.9 F (36.6 C)  Resp: 18   Filed Weights   11/03/15 1529  Weight: 222 lb 6.4 oz (  100.88 kg)    GENERAL:alert, no distress and comfortable SKIN: skin color, texture, turgor are normal, no rashes or significant lesions EYES: normal, Conjunctiva are pink and non-injected, sclera clear OROPHARYNX:no exudate, no erythema and lips, buccal mucosa, and tongue normal  NECK: supple,  thyroid normal size, non-tender, without nodularity LYMPH:  no palpable lymphadenopathy in the cervical, axillary or inguinal LUNGS: clear to auscultation and percussion with normal breathing effort HEART: regular rate & rhythm and no murmurs and no lower extremity edema ABDOMEN:abdomen soft, non-tender and normal bowel sounds Musculoskeletal:no cyanosis of digits and no clubbing  NEURO: alert & oriented x 3 with fluent speech, no focal motor/sensory deficits  LABORATORY DATA:  I have reviewed the data as listed   Chemistry      Component Value Date/Time   NA 141 09/03/2014 1316   NA 140 11/24/2011 1625   K 3.7 09/03/2014 1316   K 3.8 11/24/2011 1625   CL 108* 04/25/2013 1340   CL 106 11/24/2011 1625   CO2 26 09/03/2014 1316   CO2 26 11/24/2011 1625   BUN 11.3 09/03/2014 1316   BUN 9 11/24/2011 1625   CREATININE 0.8 09/03/2014 1316   CREATININE 0.79 11/24/2011 1625   CREATININE 0.72 05/02/2011 2018      Component Value Date/Time   CALCIUM 9.2 09/03/2014 1316   CALCIUM 9.0 11/24/2011 1625   ALKPHOS 50 09/03/2014 1316   ALKPHOS 43 11/24/2011 1625   AST 17 09/03/2014 1316   AST 16 11/24/2011 1625   ALT 19 09/03/2014 1316   ALT 19 11/24/2011 1625   BILITOT <0.20 09/03/2014 1316   BILITOT 0.2* 11/24/2011 1625       Lab Results  Component Value Date   WBC 4.7 09/03/2014   HGB 12.4 09/03/2014   HCT 39.0 09/03/2014   MCV 87.5 09/03/2014   PLT 220 09/03/2014   NEUTROABS 2.6 09/03/2014   ASSESSMENT & PLAN:  Breast cancer of lower-outer quadrant of left female breast ([place]) 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 She does have hot flashes intermittently.  Breast Cancer Surveillance: 1.  Breast exam 05/04/15: Normal 2. Mammogram  Being followed every 6 month mammograms and ultrasounds. Small nodularity that is being watched  Survivorship: Discussed the importance of physical exercise in decreasing the likelihood of breast cancer recurrence. Recommended 30 mins daily 6 days a week of either brisk walking or cycling or swimming. Encouraged patient to eat more fruits and vegetables and decrease red meat.   return to clinic in 1 year for follow-up  No orders of the defined types were placed in this encounter.   The patient has a good understanding of the overall plan. she agrees with it. she will call with any problems that may develop before the next visit here.   Rulon Eisenmenger, MD 11/03/2015

## 2015-11-03 NOTE — Assessment & Plan Note (Signed)
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 She does have hot flashes intermittently.  Breast Cancer Surveillance: 1. Breast exam 11/03/15: Normal 2. Mammogram  Being followed every 6 month mammograms and ultrasounds. Small nodularity that is being watched  Survivorship: Discussed the importance of physical exercise in decreasing the likelihood of breast cancer recurrence. Recommended 30 mins daily 6 days a week of either brisk walking or cycling or swimming. Encouraged patient to eat more fruits and vegetables and decrease red meat.   return to clinic in 1 year for follow-up

## 2015-11-04 ENCOUNTER — Telehealth: Payer: Self-pay | Admitting: Surgery

## 2015-11-04 ENCOUNTER — Ambulatory Visit: Payer: BLUE CROSS/BLUE SHIELD | Admitting: Gastroenterology

## 2015-11-04 NOTE — Telephone Encounter (Signed)
Was not able to contact.

## 2015-12-02 MED FILL — HYDROCODON-APAP 5-325: 5-325 | 30 days supply | Qty: 60 | Fill #0

## 2015-12-15 MED FILL — TAMOXIFEN 20 MG TABLET: 20 | 30 days supply | Qty: 90 | Fill #1

## 2015-12-24 ENCOUNTER — Other Ambulatory Visit: Payer: Self-pay | Admitting: Gastroenterology

## 2015-12-24 MED FILL — OMEPRAZOLE DR 20 MG CAPSULE: 20 | 30 days supply | Qty: 30 | Fill #0

## 2015-12-29 ENCOUNTER — Ambulatory Visit: Payer: BLUE CROSS/BLUE SHIELD | Admitting: Internal Medicine

## 2015-12-31 MED FILL — HYDROCODON-APAP 5-325: 5-325 | 30 days supply | Qty: 60 | Fill #0

## 2016-01-01 ENCOUNTER — Encounter: Payer: Self-pay | Admitting: Internal Medicine

## 2016-01-01 ENCOUNTER — Ambulatory Visit (INDEPENDENT_AMBULATORY_CARE_PROVIDER_SITE_OTHER): Payer: BLUE CROSS/BLUE SHIELD | Admitting: Internal Medicine

## 2016-01-01 ENCOUNTER — Other Ambulatory Visit (INDEPENDENT_AMBULATORY_CARE_PROVIDER_SITE_OTHER): Payer: BLUE CROSS/BLUE SHIELD

## 2016-01-01 VITALS — BP 132/82 | HR 77 | Temp 98.1°F | Resp 12 | Ht 66.0 in | Wt 211.0 lb

## 2016-01-01 DIAGNOSIS — E669 Obesity, unspecified: Secondary | ICD-10-CM | POA: Diagnosis not present

## 2016-01-01 DIAGNOSIS — C50512 Malignant neoplasm of lower-outer quadrant of left female breast: Secondary | ICD-10-CM

## 2016-01-01 DIAGNOSIS — Z0000 Encounter for general adult medical examination without abnormal findings: Secondary | ICD-10-CM | POA: Diagnosis not present

## 2016-01-01 LAB — COMPREHENSIVE METABOLIC PANEL
ALBUMIN: 3.9 g/dL (ref 3.5–5.2)
ALK PHOS: 43 U/L (ref 39–117)
ALT: 14 U/L (ref 0–35)
AST: 17 U/L (ref 0–37)
BUN: 8 mg/dL (ref 6–23)
CALCIUM: 9.2 mg/dL (ref 8.4–10.5)
CO2: 28 mEq/L (ref 19–32)
CREATININE: 1.28 mg/dL — AB (ref 0.40–1.20)
Chloride: 107 mEq/L (ref 96–112)
GFR: 56.1 mL/min — ABNORMAL LOW (ref >60.00)
Glucose, Bld: 104 mg/dL — ABNORMAL HIGH (ref 70–99)
POTASSIUM: 4.3 meq/L (ref 3.5–5.1)
SODIUM: 141 meq/L (ref 135–145)
TOTAL PROTEIN: 6.9 g/dL (ref 6.0–8.3)
Total Bilirubin: 0.3 mg/dL (ref 0.2–1.2)

## 2016-01-01 LAB — LIPID PANEL
CHOL/HDL RATIO: 4
Cholesterol: 169 mg/dL (ref 0–200)
HDL: 46.3 mg/dL (ref >39.00)
LDL Cholesterol: 90 mg/dL (ref 0–99)
NonHDL: 122.3
TRIGLYCERIDES: 163 mg/dL — AB (ref 0.0–149.0)
VLDL: 32.6 mg/dL (ref 0.0–40.0)

## 2016-01-01 LAB — CBC
HCT: 38.7 % (ref 36.0–46.0)
HEMOGLOBIN: 12.7 g/dL (ref 12.0–15.0)
MCHC: 32.8 g/dL (ref 30.0–36.0)
MCV: 86.3 fl (ref 78.0–100.0)
Platelets: 178 10*3/uL (ref 150.0–400.0)
RBC: 4.48 Mil/uL (ref 3.87–5.11)
RDW: 12.8 % (ref 11.5–15.5)
WBC: 4.3 10*3/uL (ref 4.0–10.5)

## 2016-01-01 LAB — HEMOGLOBIN A1C: Hgb A1c MFr Bld: 5.5 % (ref 4.6–6.5)

## 2016-01-01 NOTE — Patient Instructions (Signed)
We will check the labs today and call you back with the results.   Come back in about 1 years for a check up or call us sooner if you need [place].   Keep up the good work with the exercise and diet.   Health Maintenance, Female Adopting a healthy lifestyle and getting preventive care can go a long way to promote health and wellness. Talk with your health care provider about what schedule of regular examinations is right for you. This is a good chance for you to check in with your provider about disease prevention and staying healthy. In between checkups, there are plenty of things you can do on your own. Experts have done a lot of research about which lifestyle changes and preventive measures are most likely to keep you healthy. Ask your health care provider for more information. WEIGHT AND DIET  Eat a healthy diet  Be sure to include plenty of vegetables, fruits, low-fat dairy products, and lean protein.  Do not eat a lot of foods high in solid fats, added sugars, or salt.  Get regular exercise. This is one of the most important things you can do for your health.  Most adults should exercise for at least 150 minutes each week. The exercise should increase your heart rate and make you sweat (moderate-intensity exercise).  Most adults should also do strengthening exercises at least twice a week. This is in addition to the moderate-intensity exercise.  Maintain a healthy weight  Body mass index (BMI) is a measurement that can be used to identify possible weight problems. It estimates body fat based on height and weight. Your health care provider can help determine your BMI and help you achieve or maintain a healthy weight.  For females 24 years of age and older:   A BMI below 18.5 is considered underweight.  A BMI of 18.5 to 24.9 is normal.  A BMI of 25 to 29.9 is considered overweight.  A BMI of 30 and above is considered obese.  Watch levels of cholesterol and blood lipids  You  should start having your blood tested for lipids and cholesterol at 53 years of age, then have this test every 5 years.  You may need to have your cholesterol levels checked more often if:  Your lipid or cholesterol levels are high.  You are older than 53 years of age.  You are at high risk for heart disease.  CANCER SCREENING   Lung Cancer  Lung cancer screening is recommended for adults 53-24 years old who are at high risk for lung cancer because of a history of smoking.  A yearly low-dose CT scan of the lungs is recommended for people who:  Currently smoke.  Have quit within the past 15 years.  Have at least a 30-pack-year history of smoking. A pack year is smoking an average of one pack of cigarettes a day for 1 year.  Yearly screening should continue until it has been 15 years since you quit.  Yearly screening should stop if you develop a health problem that would prevent you from having lung cancer treatment.  Breast Cancer  Practice breast self-awareness. This means understanding how your breasts normally appear and feel.  It also means doing regular breast self-exams. Let your health care provider know about any changes, no matter how small.  If you are in your 20s or 30s, you should have a clinical breast exam (CBE) by a health care provider every 1-3 years as part of  a regular health exam.  If you are 53 or older, have a CBE every year. Also consider having a breast X-ray (mammogram) every year.  If you have a family history of breast cancer, talk to your health care provider about genetic screening.  If you are at high risk for breast cancer, talk to your health care provider about having an MRI and a mammogram every year.  Breast cancer gene (BRCA) assessment is recommended for women who have family members with BRCA-related cancers. BRCA-related cancers include:  Breast.  Ovarian.  Tubal.  Peritoneal cancers.  Results of the assessment will determine  the need for genetic counseling and BRCA1 and BRCA2 testing. Cervical Cancer Your health care provider may recommend that you be screened regularly for cancer of the pelvic organs (ovaries, uterus, and vagina). This screening involves a pelvic examination, including checking for microscopic changes to the surface of your cervix (Pap test). You may be encouraged to have this screening done every 3 years, beginning at age 53.  For women ages 8-65, health care providers may recommend pelvic exams and Pap testing every 3 years, or they may recommend the Pap and pelvic exam, combined with testing for human papilloma virus (HPV), every 5 years. Some types of HPV increase your risk of cervical cancer. Testing for HPV may also be done on women of any age with unclear Pap test results.  Other health care providers may not recommend any screening for nonpregnant women who are considered low risk for pelvic cancer and who do not have symptoms. Ask your health care provider if a screening pelvic exam is right for you.  If you have had past treatment for cervical cancer or a condition that could lead to cancer, you need Pap tests and screening for cancer for at least 20 years after your treatment. If Pap tests have been discontinued, your risk factors (such as having a new sexual partner) need to be reassessed to determine if screening should resume. Some women have medical problems that increase the chance of getting cervical cancer. In these cases, your health care provider may recommend more frequent screening and Pap tests. Colorectal Cancer  This type of cancer can be detected and often prevented.  Routine colorectal cancer screening usually begins at 53 years of age and continues through 53 years of age.  Your health care provider may recommend screening at an earlier age if you have risk factors for colon cancer.  Your health care provider may also recommend using home test kits to check for hidden blood  in the stool.  A small camera at the end of a tube can be used to examine your colon directly (sigmoidoscopy or colonoscopy). This is done to check for the earliest forms of colorectal cancer.  Routine screening usually begins at age 45.  Direct examination of the colon should be repeated every 5-10 years through 53 years of age. However, you may need to be screened more often if early forms of precancerous polyps or small growths are found. Skin Cancer  Check your skin from head to toe regularly.  Tell your health care provider about any new moles or changes in moles, especially if there is a change in a mole's shape or color.  Also tell your health care provider if you have a mole that is larger than the size of a pencil eraser.  Always use sunscreen. Apply sunscreen liberally and repeatedly throughout the day.  Protect yourself by wearing long sleeves, pants, a wide-brimmed  hat, and sunglasses whenever you are outside. HEART DISEASE, DIABETES, AND HIGH BLOOD PRESSURE   High blood pressure causes heart disease and increases the risk of stroke. High blood pressure is more likely to develop in:  People who have blood pressure in the high end of the normal range (130-139/85-89 mm Hg).  People who are overweight or obese.  People who are African American.  If you are 41-62 years of age, have your blood pressure checked every 3-5 years. If you are 14 years of age or older, have your blood pressure checked every year. You should have your blood pressure measured twice--once when you are at a hospital or clinic, and once when you are not at a hospital or clinic. Record the average of the two measurements. To check your blood pressure when you are not at a hospital or clinic, you can use:  An automated blood pressure machine at a pharmacy.  A home blood pressure monitor.  If you are between 14 years and 63 years old, ask your health care provider if you should take aspirin to prevent  strokes.  Have regular diabetes screenings. This involves taking a blood sample to check your fasting blood sugar level.  If you are at a normal weight and have a low risk for diabetes, have this test once every three years after 53 years of age.  If you are overweight and have a high risk for diabetes, consider being tested at a younger age or more often. PREVENTING INFECTION  Hepatitis B  If you have a higher risk for hepatitis B, you should be screened for this virus. You are considered at high risk for hepatitis B if:  You were born in a country where hepatitis B is common. Ask your health care provider which countries are considered high risk.  Your parents were born in a high-risk country, and you have not been immunized against hepatitis B (hepatitis B vaccine).  You have HIV or AIDS.  You use needles to inject street drugs.  You live with someone who has hepatitis B.  You have had sex with someone who has hepatitis B.  You get hemodialysis treatment.  You take certain medicines for conditions, including cancer, organ transplantation, and autoimmune conditions. Hepatitis C  Blood testing is recommended for:  Everyone born from [birth year] through 1963.  Anyone with known risk factors for hepatitis C. Sexually transmitted infections (STIs)  You should be screened for sexually transmitted infections (STIs) including gonorrhea and chlamydia if:  You are sexually active and are younger than 53 years of age.  You are older than 53 years of age and your health care provider tells you that you are at risk for this type of infection.  Your sexual activity has changed since you were last screened and you are at an increased risk for chlamydia or gonorrhea. Ask your health care provider if you are at risk.  If you do not have HIV, but are at risk, it may be recommended that you take a prescription medicine daily to prevent HIV infection. This is called pre-exposure prophylaxis  (PrEP). You are considered at risk if:  You are sexually active and do not regularly use condoms or know the HIV status of your partner(s).  You take drugs by injection.  You are sexually active with a partner who has HIV. Talk with your health care provider about whether you are at high risk of being infected with HIV. If you choose to begin PrEP, you  should first be tested for HIV. You should then be tested every 3 months for as long as you are taking PrEP.  PREGNANCY   If you are premenopausal and you may become pregnant, ask your health care provider about preconception counseling.  If you may become pregnant, take 400 to 800 micrograms (mcg) of folic acid every day.  If you want to prevent pregnancy, talk to your health care provider about birth control (contraception). OSTEOPOROSIS AND MENOPAUSE   Osteoporosis is a disease in which the bones lose minerals and strength with aging. This can result in serious bone fractures. Your risk for osteoporosis can be identified using a bone density scan.  If you are 59 years of age or older, or if you are at risk for osteoporosis and fractures, ask your health care provider if you should be screened.  Ask your health care provider whether you should take a calcium or vitamin D supplement to lower your risk for osteoporosis.  Menopause may have certain physical symptoms and risks.  Hormone replacement therapy may reduce some of these symptoms and risks. Talk to your health care provider about whether hormone replacement therapy is right for you.  HOME CARE INSTRUCTIONS   Schedule regular health, dental, and eye exams.  Stay current with your immunizations.   Do not use any tobacco products including cigarettes, chewing tobacco, or electronic cigarettes.  If you are pregnant, do not drink alcohol.  If you are breastfeeding, limit how much and how often you drink alcohol.  Limit alcohol intake to no more than 1 drink per day for  nonpregnant women. One drink equals 12 ounces of beer, 5 ounces of wine, or 1 ounces of hard liquor.  Do not use street drugs.  Do not share needles.  Ask your health care provider for help if you need support or information about quitting drugs.  Tell your health care provider if you often feel depressed.  Tell your health care provider if you have ever been abused or do not feel safe at home.   This information is not intended to replace advice given to you by your health care provider. Make sure you discuss any questions you have with your health care provider.   Document Released: 05/31/2011 Document Revised: 12/06/2014 Document Reviewed: 10/17/2013 Elsevier Interactive Patient Education [employer].

## 2016-01-01 NOTE — Progress Notes (Signed)
Pre visit review using our clinic review tool, if applicable. No additional management support is needed unless otherwise documented below in the visit note. 

## 2016-01-02 DIAGNOSIS — Z0000 Encounter for general adult medical examination without abnormal findings: Secondary | ICD-10-CM | POA: Insufficient documentation

## 2016-01-02 LAB — VITAMIN D 25 HYDROXY (VIT D DEFICIENCY, FRACTURES): VITD: 29.39 ng/mL — ABNORMAL LOW (ref 30.00–100.00)

## 2016-01-02 LAB — HEPATITIS C ANTIBODY: HCV Ab: NEGATIVE

## 2016-01-02 NOTE — Assessment & Plan Note (Signed)
Checking labs and adjust as needed. Non-smoker and counseled about the need for exercise. Given screening recommendations. She will work on weight loss.

## 2016-01-02 NOTE — Progress Notes (Signed)
   Subjective:    Patient ID: Joann Page, female    DOB: 26-Aug-1961, 55 y.o.   MRN: [id number]  HPI The patient is a new 55 YO female coming in for wellness. Has undergone cancer treatment of breast cancer and is on tamoxifen. No problems with this. Does not exercise and non-smoker. No new concerns.   [facility], [facility], social history reviewed and updated.   Review of Systems  Constitutional: Negative for fever, activity change, appetite change, fatigue and unexpected weight change.  HENT: Negative.   Eyes: Negative.   Respiratory: Negative for cough, chest tightness, shortness of breath and wheezing.   Cardiovascular: Negative for chest pain, palpitations and leg swelling.  Gastrointestinal: Negative for abdominal pain, diarrhea, constipation, blood in stool and abdominal distention.  Musculoskeletal: Positive for arthralgias. Negative for myalgias, joint swelling and gait problem.  Skin: Negative.   Neurological: Negative.   Psychiatric/Behavioral: Negative.       Objective:   Physical Exam  Constitutional: She is oriented to person, place, and time. She appears well-developed and well-nourished.  Overweight  HENT:  Head: Normocephalic and atraumatic.  Eyes: EOM are normal.  Neck: Normal range of motion.  Cardiovascular: Normal rate and regular rhythm.   Pulmonary/Chest: Effort normal and breath sounds normal. No respiratory distress. She has no wheezes. She has no rales.  Abdominal: Soft. Bowel sounds are normal. She exhibits no distension. There is no tenderness. There is no rebound.  Musculoskeletal: She exhibits no edema.  Neurological: She is alert and oriented to person, place, and time. Coordination normal.  Skin: Skin is warm and dry.  Psychiatric: She has a normal mood and affect.   Filed Vitals:   01/01/16 1547  BP: 132/82  Pulse: 77  Temp: 98.1 F (36.7 C)  TempSrc: Oral  Resp: 12  Height: 5\' 6"  (1.676 m)  Weight: 211 lb (95.709 kg)  SpO2: 99%      Assessment &  Plan:

## 2016-01-02 NOTE — Assessment & Plan Note (Signed)
Talked to her about her weight and the need for exercise to help. She will work on it.

## 2016-01-02 NOTE — Assessment & Plan Note (Signed)
Still on tamoxifen for prevention of recurrence.

## 2016-01-13 ENCOUNTER — Encounter: Payer: Self-pay | Admitting: Gastroenterology

## 2016-01-13 ENCOUNTER — Ambulatory Visit (INDEPENDENT_AMBULATORY_CARE_PROVIDER_SITE_OTHER): Payer: BLUE CROSS/BLUE SHIELD | Admitting: Gastroenterology

## 2016-01-13 VITALS — BP 114/80 | HR 76 | Ht 65.75 in | Wt 206.2 lb

## 2016-01-13 DIAGNOSIS — R1310 Dysphagia, unspecified: Secondary | ICD-10-CM

## 2016-01-13 DIAGNOSIS — K2100 Gastro-esophageal reflux disease with esophagitis, without bleeding: Secondary | ICD-10-CM

## 2016-01-13 MED ORDER — OMEPRAZOLE 40 MG PO CPDR: 40.0000 mg | DELAYED_RELEASE_CAPSULE | Freq: Two times a day (BID) | ORAL | Status: DC

## 2016-01-13 NOTE — Patient Instructions (Signed)
We have sent your prescription to your pharmacy  You have been scheduled for an endoscopy. Please follow written instructions given to you at your visit today. If you use inhalers (even only as needed), please bring them with you on the day of your procedure. Your physician has requested that you go to www.startemmi.com and enter the access code given to you at your visit today. This web site gives a general overview about your procedure. However, you should still follow specific instructions given to you by our office regarding your preparation for the procedure.  Follow up in 2 months

## 2016-01-13 NOTE — Progress Notes (Signed)
Joann Page    [id number]    Jan 28, 1961  Primary Care Physician:Elizabeth Becky [place], MD  Referring Physician: Harle Battiest, MD [address] [place], [place] [id number]  Chief complaint:  GERD, Dysphagia HPI: 55 year old African American female with a history of breast cancer in remission since 2013 with h/o gastroesophageal reflux her for follow up. She had an upper GI series in January 2013 as part of preop evaluation for lapband surgery and she was found to have a small sliding hiatal hernia and marked esophageal reflux up to the level of cervical esophagus. EGD 07/2014 showed reflux esophagitis, small hiatal hernia and nonobstructing  mild distal esophageal stricture which was not dilated She complains of having increased reflux episodes and intermittent difficulty swallowing with both liquids and solids. No regurgitation. Denies any nausea, vomiting, abdominal pain, melena or bright red blood per rectum. She has intentionally lost 15-20 pounds in the past few months with diet and exercise. She did receive radiation as part of her treatment for breast cancer in 2013 She had a colonoscopy in July 2012 which was incomplete due to tortuosity and redundancy of the colon. A subsequent barium enema was essentially normal.    Outpatient Encounter Prescriptions as of 01/13/2016  Medication Sig  . HYDROcodone-acetaminophen (NORCO/VICODIN) 5-325 MG per tablet Take 1 tablet by mouth every 4 (four) hours as needed (4- 6 hours prn). pain  . ibuprofen (ADVIL,MOTRIN) 200 MG tablet Take 400 mg by mouth every 6 (six) hours as needed. pain  . omeprazole (PRILOSEC) 20 MG capsule TAKE 1 CAPSULE BY MOUTH ONCE DAILY  . tamoxifen (NOLVADEX) 20 MG tablet TAKE 1 TABLET BY MOUTH ONCE DAILY   No facility-administered encounter medications on file as of 01/13/2016.    Allergies as of 01/13/2016 - Review Complete 01/13/2016  Allergen Reaction Noted  . Sulfa antibiotics Shortness Of  Breath and Swelling 04/29/2011    Past Medical History  Diagnosis Date  . Knee pain, right   . Obesity   . Cancer ([facility]) 07/07/12    Left Breast Inv Ductal Ca.  . No pertinent past medical history   . H/O hiatal hernia     small  . Hyperlipidemia   . Status post chemotherapy comp 10/27/12     4 CyclesTaxotere/ Cytoxan  . Breast cancer ([facility]) 07/17/12    at age 49, ER/PR +, hER 2 -  . 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 Degenerative disk disease 2010    Lumbar spine  . GERD (gastroesophageal reflux disease)     hx  . S/P radiation therapy 11/30/12 - 01/12/13    Left Breast / 50 gray in 25 Fractions with boost 10 gray in 5 Fractions  . Status post chemotherapy Comp. 01/12/13    neoadjuvant chemotherapy: 4 Cycles of Taxotere/Cytoxan  . Use of tamoxifen (Nolvadex) 01/18/13    Past Surgical History  Procedure Laterality Date  . Breath tek h pylori  12/07/2011    Procedure: BREATH TEK H PYLORI;  Surgeon: Shann Medal, MD;  Location: [facility];  Service: Endoscopy;  Laterality: N/A;  . Needle core biopsy  06/30/12    Left Breast: 12 O'clock/ Benign Parenchyma  . 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  . Abdominal hysterectomy  04/2000    partial, fibroids    Family History  Problem Relation Age of Onset  . Colon cancer Father 26  . Colon polyps Brother   . Breast cancer Sister 31  . Obesity Sister   . Lymphoma Brother   . Esophageal cancer Neg Hx   . Stomach cancer Neg Hx     Social History   Social History  . Marital Status: Married    Spouse Name: N/A  . Number of Children: 1  . Years of Education: N/A   Occupational History  . Hydrographic surveyor   Social History Main Topics  . Smoking status: Never Smoker   . Smokeless tobacco:  Never Used  . Alcohol Use: No     Comment: menses age 75, no HRT or birth control pills   . Drug Use: No  . Sexual Activity: Yes   Other Topics Concern  . Not on file   Social History Narrative   Married for 20 years, b=currently separated   57 year old child            Review of systems: Review of Systems  Constitutional: Negative for fever and chills.  HENT: Negative.   Eyes: Negative for blurred vision.  Respiratory: Negative for cough, shortness of breath and wheezing.   Cardiovascular: Negative for chest pain and palpitations.  Gastrointestinal: as per HPI Genitourinary: Negative for dysuria, urgency, frequency and hematuria.  Musculoskeletal: Negative for myalgias, back pain and joint pain.  Skin: Negative for itching and rash.  Neurological: Negative for dizziness, tremors, focal weakness, seizures and loss of consciousness.  Endo/Heme/Allergies: Negative for environmental allergies.  Psychiatric/Behavioral: Negative for depression, suicidal ideas and hallucinations.  All other systems reviewed and are negative.   Physical Exam: Filed Vitals:   01/13/16 0924  BP: 114/80  Pulse: 76   Gen:      No acute distress HEENT:  EOMI, sclera anicteric Neck:     No masses; no thyromegaly Lungs:    Clear to auscultation bilaterally; normal respiratory effort CV:         Regular rate and rhythm; no murmurs Abd:      + bowel sounds; soft, non-tender; no palpable masses, no distension Ext:    No edema; adequate peripheral perfusion Skin:      Warm and dry; no rash Neuro: alert and oriented x 3 Psych: normal mood and affect  Data Reviewed: EGD 07/2014 grade 2 reflux esophagitis. Status post biopsies 3 cm hiatal hernia Nonobstructing mild distal esophageal stricture not dilated   Assessment and Plan/Recommendations:  55 year old female with history of breast cancer status post chemoradiation and surgical resection in 2013, chronic GERD and history of distal esophageal  stricture based on EGD in 2015 which was not dilated here with complaints of increased reflux symptoms and intermittent dysphagia We will increase PPI to twice daily, 30 minutes before breakfast and dinner Discussed antireflux measures Schedule for EGD with dilation Discussed at next visit regarding screening colonoscopy, she had a incomplete colonoscopy in 2012 and subsequent barium enema was normal. We'll have to consider a repeat colonoscopy at some point Return in 2-3 months  K. Denzil Magnuson , MD [phone] Mon-Fri 8a-5p [phone] after 5p, weekends, holidays

## 2016-01-28 MED FILL — HYDROCODON-APAP 5-325: 5-325 | 30 days supply | Qty: 60 | Fill #0

## 2016-01-28 MED FILL — OMEPRAZOLE DR 40 MG CAPSULE: 40 | 30 days supply | Qty: 60 | Fill #0

## 2016-02-06 ENCOUNTER — Encounter: Payer: Self-pay | Admitting: Gastroenterology

## 2016-02-06 ENCOUNTER — Ambulatory Visit (AMBULATORY_SURGERY_CENTER): Payer: BLUE CROSS/BLUE SHIELD | Admitting: Gastroenterology

## 2016-02-06 VITALS — BP 130/65 | HR 72 | Temp 96.8°F | Resp 14 | Ht 65.0 in | Wt 206.0 lb

## 2016-02-06 DIAGNOSIS — K2100 Gastro-esophageal reflux disease with esophagitis, without bleeding: Secondary | ICD-10-CM

## 2016-02-06 DIAGNOSIS — R1310 Dysphagia, unspecified: Secondary | ICD-10-CM | POA: Diagnosis not present

## 2016-02-06 MED ORDER — SODIUM CHLORIDE 0.9 % IV SOLN: 500.0000 mL | INTRAVENOUS | Status: DC

## 2016-02-06 NOTE — Patient Instructions (Signed)
YOU HAD AN ENDOSCOPIC PROCEDURE TODAY AT THE [facility]:   Refer to the procedure report that was given to you for any specific questions about what was found during the examination.  If the procedure report does not answer your questions, please call your gastroenterologist to clarify.  If you requested that your care partner not be given the details of your procedure findings, then the procedure report has been included in a sealed envelope for you to review at your convenience later.  YOU SHOULD EXPECT: Some feelings of bloating in the abdomen. Passage of more gas than usual.  Walking can help get rid of the air that was put into your GI tract during the procedure and reduce the bloating. If you had a lower endoscopy (such as a colonoscopy or flexible sigmoidoscopy) you may notice spotting of blood in your stool or on the toilet paper. If you underwent a bowel prep for your procedure, you may not have a normal bowel movement for a few days.  Please Note:  You might notice some irritation and congestion in your nose or some drainage.  This is from the oxygen used during your procedure.  There is no need for concern and it should clear up in a day or so.  SYMPTOMS TO REPORT IMMEDIATELY:    Following upper endoscopy (EGD)  Vomiting of blood or coffee ground material  New chest pain or pain under the shoulder blades  Painful or persistently difficult swallowing  New shortness of breath  Fever of 100F or higher  Black, tarry-looking stools  For urgent or emergent issues, a gastroenterologist can be reached at any hour by calling [phone].   DIET: Your first meal following the procedure should be a small meal and then it is ok to progress to your normal diet. Heavy or fried foods are harder to digest and may make you feel nauseous or bloated.  Likewise, meals heavy in dairy and vegetables can increase bloating.  Drink plenty of fluids but you should avoid alcoholic beverages  for 24 hours.  ACTIVITY:  You should plan to take it easy for the rest of today and you should NOT DRIVE or use heavy machinery until tomorrow (because of the sedation medicines used during the test).    FOLLOW UP: Our staff will call the number listed on your records the next business day following your procedure to check on you and address any questions or concerns that you may have regarding the information given to you following your procedure. If we do not reach you, we will leave a message.  However, if you are feeling well and you are not experiencing any problems, there is no need to return our call.  We will assume that you have returned to your regular daily activities without incident.  If any biopsies were taken you will be contacted by phone or by letter within the next 1-3 weeks.  Please call us at [phone] if you have not heard about the biopsies in 3 weeks.    SIGNATURES/CONFIDENTIALITY: You and/or your care partner have signed paperwork which will be entered into your electronic medical record.  These signatures attest to the fact that that the information above on your After Visit Summary has been reviewed and is understood.  Full responsibility of the confidentiality of this discharge information lies with you and/or your care-partner.  Hiatal hernia information given. 

## 2016-02-06 NOTE — Progress Notes (Signed)
A/ox3, pleased with MAC, report to RN 

## 2016-02-06 NOTE — Op Note (Signed)
[place] Patient Name: Joann Page Procedure Date: 02/06/2016 9:05 AM MRN: [id number] Endoscopist: Mauri Pole , MD Age: 55 Referring MD:  Date of Birth: 05-04-1961 Gender: Female Procedure:                Upper GI endoscopy Indications:              Dysphagia, Suspected esophageal reflux Medicines:                Propofol total dose 150 mg IV, Monitored Anesthesia                            Care Procedure:                Pre-Anesthesia Assessment:                           - Prior to the procedure, a History and Physical                            was performed, and patient medications and                            allergies were reviewed. The patient's tolerance of                            previous anesthesia was also reviewed. The risks                            and benefits of the procedure and the sedation                            options and risks were discussed with the patient.                            All questions were answered, and informed consent                            was obtained. Prior Anticoagulants: The patient has                            taken no previous anticoagulant or antiplatelet                            agents. ASA Grade Assessment: II - A patient with                            mild systemic disease. After reviewing the risks                            and benefits, the patient was deemed in                            satisfactory condition to undergo the procedure.  After obtaining informed consent, the endoscope was                            passed under direct vision. Throughout the                            procedure, the patient's blood pressure, pulse, and                            oxygen saturations were monitored continuously. The                            Model GIF-HQ190 [id number]) scope was introduced                            through the mouth, and advanced to the second part                          of duodenum. The upper GI endoscopy was                            accomplished without difficulty. The patient                            tolerated the procedure well. Scope In: Scope Out: Findings:      A 5 cm hiatal hernia was present. Estimated blood loss: none.      The esophagus was normal.      The examined duodenum was normal.      The stomach was normal. Complications:            No immediate complications. Estimated Blood Loss:     Estimated blood loss: none. Impression:               - 5 cm hiatal hernia.                           - Normal esophagus.                           - Normal examined duodenum.                           - Normal stomach.                           - No specimens collected. Recommendation:           - Patient has a contact number available for                            emergencies. The signs and symptoms of potential                            delayed complications were discussed with the                            patient. Return to  normal activities tomorrow.                            Written discharge instructions were provided to the                            patient.                           - Resume previous diet.                           - Continue present medications.                           - No repeat upper endoscopy.                           - Return to GI office PRN.                           - Due for recall screening colonoscopy now given                            your family history of colon cancer Procedure Code(s):        --- Professional ---                           [phone], Esophagogastroduodenoscopy, flexible,                            transoral; diagnostic, including collection of                            specimen(s) by brushing or washing, when performed                            (separate procedure) CPT copyright 2016 [employer]. All rights reserved. Mauri Pole,  MD 02/06/2016 10:29:53 AM This report has been signed electronically. Number of Addenda: 0

## 2016-02-09 ENCOUNTER — Telehealth: Payer: Self-pay | Admitting: *Deleted

## 2016-02-09 NOTE — Telephone Encounter (Signed)
  Follow up Call-  Call back number 02/06/2016 08/14/2014  Post procedure Call Back phone  # [phone]  Permission to leave phone message Yes Yes     Patient questions:  Do you have a fever, pain , or abdominal swelling? No. Pain Score  0 *  Have you tolerated food without any problems? Yes.    Have you been able to return to your normal activities? Yes.    Do you have any questions about your discharge instructions: Diet   No. Medications  No. Follow up visit  No.  Do you have questions or concerns about your Care? No.  Actions: * If pain score is 4 or above: No action needed, pain <4.

## 2016-02-26 MED FILL — HYDROCODON-APAP 5-325: 5-325 | 30 days supply | Qty: 60 | Fill #0

## 2016-03-16 ENCOUNTER — Other Ambulatory Visit: Payer: Self-pay | Admitting: Hematology and Oncology

## 2016-03-16 MED FILL — TAMOXIFEN 20 MG TABLET: 20 | 30 days supply | Qty: 30 | Fill #0

## 2016-03-26 MED FILL — HYDROCODON-APAP 5-325: 5-325 | 30 days supply | Qty: 60 | Fill #0

## 2016-04-14 ENCOUNTER — Encounter: Payer: BLUE CROSS/BLUE SHIELD | Admitting: Gastroenterology

## 2016-04-16 MED FILL — TAMOXIFEN 20 MG TABLET: 20 | 30 days supply | Qty: 30 | Fill #1

## 2016-04-28 MED FILL — HYDROCODON-APAP 5-325: 5-325 | 30 days supply | Qty: 60 | Fill #0

## 2016-05-04 ENCOUNTER — Other Ambulatory Visit: Payer: BLUE CROSS/BLUE SHIELD

## 2016-05-04 ENCOUNTER — Ambulatory Visit: Payer: BLUE CROSS/BLUE SHIELD | Admitting: Hematology and Oncology

## 2016-05-11 ENCOUNTER — Ambulatory Visit (HOSPITAL_BASED_OUTPATIENT_CLINIC_OR_DEPARTMENT_OTHER): Payer: BLUE CROSS/BLUE SHIELD | Admitting: Hematology and Oncology

## 2016-05-11 ENCOUNTER — Other Ambulatory Visit (HOSPITAL_BASED_OUTPATIENT_CLINIC_OR_DEPARTMENT_OTHER): Payer: BLUE CROSS/BLUE SHIELD

## 2016-05-11 ENCOUNTER — Encounter: Payer: Self-pay | Admitting: Hematology and Oncology

## 2016-05-11 VITALS — BP 149/88 | HR 60 | Temp 98.3°F | Resp 18 | Ht 65.0 in | Wt 193.1 lb

## 2016-05-11 DIAGNOSIS — Z79810 Long term (current) use of selective estrogen receptor modulators (SERMs): Secondary | ICD-10-CM | POA: Diagnosis not present

## 2016-05-11 DIAGNOSIS — C50512 Malignant neoplasm of lower-outer quadrant of left female breast: Secondary | ICD-10-CM

## 2016-05-11 DIAGNOSIS — Z853 Personal history of malignant neoplasm of breast: Secondary | ICD-10-CM

## 2016-05-11 LAB — CBC WITH DIFFERENTIAL/PLATELET
BASO%: 0.6 % (ref 0.0–2.0)
Basophils Absolute: 0 10*3/uL (ref 0.0–0.1)
EOS%: 1.9 % (ref 0.0–7.0)
Eosinophils Absolute: 0.1 10*3/uL (ref 0.0–0.5)
HCT: 38.8 % (ref 34.8–46.6)
HGB: 12.7 g/dL (ref 11.6–15.9)
LYMPH#: 2.1 10*3/uL (ref 0.9–3.3)
LYMPH%: 39.9 % (ref 14.0–49.7)
MCH: 28.6 pg (ref 25.1–34.0)
MCHC: 32.6 g/dL (ref 31.5–36.0)
MCV: 87.8 fL (ref 79.5–101.0)
MONO#: 0.4 10*3/uL (ref 0.1–0.9)
MONO%: 7 % (ref 0.0–14.0)
NEUT%: 50.6 % (ref 38.4–76.8)
NEUTROS ABS: 2.7 10*3/uL (ref 1.5–6.5)
PLATELETS: 184 10*3/uL (ref 145–400)
RBC: 4.42 10*6/uL (ref 3.70–5.45)
RDW: 12.7 % (ref 11.2–14.5)
WBC: 5.3 10*3/uL (ref 3.9–10.3)

## 2016-05-11 LAB — COMPREHENSIVE METABOLIC PANEL
ALT: 28 U/L (ref 0–55)
ANION GAP: 5 meq/L (ref 3–11)
AST: 25 U/L (ref 5–34)
Albumin: 3.4 g/dL — ABNORMAL LOW (ref 3.5–5.0)
Alkaline Phosphatase: 45 U/L (ref 40–150)
BUN: 9.6 mg/dL (ref 7.0–26.0)
CALCIUM: 9.5 mg/dL (ref 8.4–10.4)
CHLORIDE: 108 meq/L (ref 98–109)
CO2: 28 meq/L (ref 22–29)
CREATININE: 0.8 mg/dL (ref 0.6–1.1)
EGFR: >90 mL/min/{1.73_m2} (ref >90)
Glucose: 89 mg/dl (ref 70–140)
Potassium: 4.5 mEq/L (ref 3.5–5.1)
Sodium: 141 mEq/L (ref 136–145)
TOTAL PROTEIN: 6.9 g/dL (ref 6.4–8.3)

## 2016-05-11 NOTE — Progress Notes (Signed)
Patient Care Team: Hoyt Koch, MD as PCP - General (Internal Medicine) Berle Mull, MD as Consulting Physician (Family Medicine) Lafayette Dragon, MD as Consulting Physician (Gastroenterology) Bryson Ha Himmelrich, RD as Dietitian  SUMMARY OF ONCOLOGIC HISTORY:   Breast cancer of lower-outer quadrant of left female breast ([place])   07/03/2012 Initial Diagnosis Left: IDC, MRI 2.3 cm mass   07/17/2012 Surgery Left Lumpectomy: 1.7 cm, IDC, High grade, 4 SN neg,  with HG DCIS with necrosis, Oncotype Dx Intermed grade   07/31/2012 - 11/23/2012 Chemotherapy Adjuvant chemo TC x 4   11/30/2012 - 01/12/2013 Radiation Therapy Adjuvant XRT   01/18/2013 -  Anti-estrogen oral therapy Adjuvant  tamoxifen 10 years    CHIEF COMPLIANT: Follow-up on tamoxifen therapy  INTERVAL HISTORY: Joann Page is a 55 year old with above-mentioned history left breast cancer and is currently on adjuvant tamoxifen appears to be tolerating it fairly well. She does have intermittent hot flashes which appeared to come and go depending on the season. She had mammograms in February followed by ultrasound which were being done to evaluate the small nodularity which appears to be a complicated cyst. The last mammogram apparently testicle shrunk in size. She denies any other lumps or nodules in the breasts.  REVIEW OF SYSTEMS:   Constitutional: Denies fevers, chills or abnormal weight loss Eyes: Denies blurriness of vision Ears, nose, mouth, throat, and face: Denies mucositis or sore throat Respiratory: Denies cough, dyspnea or wheezes Cardiovascular: Denies palpitation, chest discomfort Gastrointestinal:  Denies nausea, heartburn or change in bowel habits Skin: Denies abnormal skin rashes Lymphatics: Denies new lymphadenopathy or easy bruising Neurological:Denies numbness, tingling or new weaknesses Behavioral/Psych: Mood is stable, no new changes  Extremities: No lower extremity edema Breast:  denies any pain or lumps or  nodules in either breasts All other systems were reviewed with the patient and are negative.  I have reviewed the past medical history, past surgical history, social history and family history with the patient and they are unchanged from previous note.  ALLERGIES:  is allergic to sulfa antibiotics.  MEDICATIONS:  Current Outpatient Prescriptions  Medication Sig Dispense Refill  . HYDROcodone-acetaminophen (NORCO/VICODIN) 5-325 MG per tablet Take 1 tablet by mouth every 4 (four) hours as needed (4- 6 hours prn). pain (Patient not taking: Reported on 02/06/2016) 30 tablet 1  . ibuprofen (ADVIL,MOTRIN) 200 MG tablet Take 400 mg by mouth every 6 (six) hours as needed. Reported on 02/06/2016    . omeprazole (PRILOSEC) 20 MG capsule TAKE 1 CAPSULE BY MOUTH ONCE DAILY 30 capsule 1  . omeprazole (PRILOSEC) 40 MG capsule Take 1 capsule (40 mg total) by mouth 2 (two) times daily before a meal. 60 capsule 3  . tamoxifen (NOLVADEX) 20 MG tablet TAKE 1 TABLET BY MOUTH ONCE DAILY 90 tablet 1   No current facility-administered medications for this visit.    PHYSICAL EXAMINATION: ECOG PERFORMANCE STATUS: 0 - Asymptomatic  Filed Vitals:   05/11/16 1459  BP: 149/88  Pulse: 60  Temp: 98.3 F (36.8 C)  Resp: 18   Filed Weights   05/11/16 1459  Weight: 193 lb 1.6 oz (87.59 kg)    GENERAL:alert, no distress and comfortable SKIN: skin color, texture, turgor are normal, no rashes or significant lesions EYES: normal, Conjunctiva are pink and non-injected, sclera clear OROPHARYNX:no exudate, no erythema and lips, buccal mucosa, and tongue normal  NECK: supple, thyroid normal size, non-tender, without nodularity LYMPH:  no palpable lymphadenopathy in the cervical, axillary or inguinal  LUNGS: clear to auscultation and percussion with normal breathing effort HEART: regular rate & rhythm and no murmurs and no lower extremity edema ABDOMEN:abdomen soft, non-tender and normal bowel  sounds MUSCULOSKELETAL:no cyanosis of digits and no clubbing  NEURO: alert & oriented x 3 with fluent speech, no focal motor/sensory deficits EXTREMITIES: No lower extremity edema BREAST: No palpable masses or nodules in either right or left breasts. No palpable axillary supraclavicular or infraclavicular adenopathy no breast tenderness or nipple discharge. (exam performed in the presence of a chaperone)  LABORATORY DATA:  I have reviewed the data as listed   Chemistry      Component Value Date/Time   NA 141 01/01/2016 1623   NA 141 09/03/2014 1316   K 4.3 01/01/2016 1623   K 3.7 09/03/2014 1316   CL 107 01/01/2016 1623   CL 108* 04/25/2013 1340   CO2 28 01/01/2016 1623   CO2 26 09/03/2014 1316   BUN 8 01/01/2016 1623   BUN 11.3 09/03/2014 1316   CREATININE 1.28* 01/01/2016 1623   CREATININE 0.8 09/03/2014 1316   CREATININE 0.79 11/24/2011 1625      Component Value Date/Time   CALCIUM 9.2 01/01/2016 1623   CALCIUM 9.2 09/03/2014 1316   ALKPHOS 43 01/01/2016 1623   ALKPHOS 50 09/03/2014 1316   AST 17 01/01/2016 1623   AST 17 09/03/2014 1316   ALT 14 01/01/2016 1623   ALT 19 09/03/2014 1316   BILITOT 0.3 01/01/2016 1623   BILITOT <0.20 09/03/2014 1316       Lab Results  Component Value Date   WBC 5.3 05/11/2016   HGB 12.7 05/11/2016   HCT 38.8 05/11/2016   MCV 87.8 05/11/2016   PLT 184 05/11/2016   NEUTROABS 2.7 05/11/2016     ASSESSMENT & PLAN:  Breast cancer of lower-outer quadrant of left female breast ([place]) 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 She does have hot flashes intermittently.  Breast Cancer Surveillance: 1. Breast exam 05/11/2016: Normal 2. [facility] 2017 benign, patient has a small complex cyst/intramammary lymph node that was being watched  return to clinic in 1 year for follow-up   No orders of the defined types were placed in this encounter.   The patient has a good understanding of the overall plan. she agrees with it. she will call with any problems that may develop before the next visit here.   Rulon Eisenmenger, MD 05/11/2016

## 2016-05-11 NOTE — Assessment & Plan Note (Signed)
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 She does have hot flashes intermittently.  Breast Cancer Surveillance: 1. Breast exam 05/11/2016: Normal 2. [facility]   return to clinic in 1 year for follow-up

## 2016-05-13 ENCOUNTER — Encounter: Payer: BLUE CROSS/BLUE SHIELD | Admitting: Gastroenterology

## 2016-05-17 MED FILL — TAMOXIFEN 20 MG TABLET: 20 | 30 days supply | Qty: 30 | Fill #2

## 2016-05-27 MED FILL — HYDROCODON-APAP 5-325: 5-325 | 30 days supply | Qty: 60 | Fill #0

## 2016-06-17 MED FILL — TAMOXIFEN 20 MG TABLET: 20 | 30 days supply | Qty: 30 | Fill #3

## 2016-06-25 MED FILL — HYDROCODON-APAP 5-325: 5-325 | 30 days supply | Qty: 60 | Fill #0

## 2016-06-28 ENCOUNTER — Ambulatory Visit (AMBULATORY_SURGERY_CENTER): Payer: Self-pay | Admitting: *Deleted

## 2016-06-28 VITALS — Ht 65.0 in | Wt 194.0 lb

## 2016-06-28 DIAGNOSIS — Z800 Family history of malignant neoplasm of digestive organs: Secondary | ICD-10-CM

## 2016-06-28 MED ORDER — NA SULFATE-K SULFATE-MG SULF 17.5-3.13-1.6 GM/177ML PO SOLN: ORAL | 0 refills | Status: DC

## 2016-06-28 NOTE — Progress Notes (Signed)
Patient denies any allergies to eggs or soy. Patient denies any problems with anesthesia/sedation. Patient denies any oxygen use at home and does not take any diet/weight loss medications.  

## 2016-07-12 ENCOUNTER — Encounter: Payer: BLUE CROSS/BLUE SHIELD | Admitting: Gastroenterology

## 2016-07-12 MED FILL — TAMOXIFEN 20 MG TABLET: 20 | 30 days supply | Qty: 30 | Fill #4

## 2016-07-22 DIAGNOSIS — Z853 Personal history of malignant neoplasm of breast: Secondary | ICD-10-CM | POA: Diagnosis not present

## 2016-07-22 DIAGNOSIS — N6002 Solitary cyst of left breast: Secondary | ICD-10-CM | POA: Diagnosis not present

## 2016-07-22 LAB — HM MAMMOGRAPHY: HM MAMMO: NORMAL (ref 0–4)

## 2016-07-27 MED FILL — HYDROCODON-APAP 5-325: 5-325 | 30 days supply | Qty: 60 | Fill #0

## 2016-08-06 ENCOUNTER — Encounter: Payer: Self-pay | Admitting: Internal Medicine

## 2016-08-16 MED FILL — SUPREP BOWEL PREP KIT: 17.5-3.13-1 | 2 days supply | Qty: 354 | Fill #0

## 2016-08-17 ENCOUNTER — Encounter: Payer: Self-pay | Admitting: Gastroenterology

## 2016-08-17 MED FILL — TAMOXIFEN 20 MG TABLET: 20 | 30 days supply | Qty: 30 | Fill #5

## 2016-08-24 MED FILL — HYDROCODON-APAP 5-325: 5-325 | 30 days supply | Qty: 60 | Fill #0

## 2016-08-25 ENCOUNTER — Encounter: Payer: Self-pay | Admitting: Gastroenterology

## 2016-08-25 ENCOUNTER — Ambulatory Visit (AMBULATORY_SURGERY_CENTER): Payer: BLUE CROSS/BLUE SHIELD | Admitting: Gastroenterology

## 2016-08-25 VITALS — BP 101/65 | HR 85 | Temp 97.1°F | Resp 13 | Ht 65.0 in | Wt 194.0 lb

## 2016-08-25 DIAGNOSIS — Z1211 Encounter for screening for malignant neoplasm of colon: Secondary | ICD-10-CM | POA: Diagnosis not present

## 2016-08-25 DIAGNOSIS — Z800 Family history of malignant neoplasm of digestive organs: Secondary | ICD-10-CM

## 2016-08-25 HISTORY — PX: COLONOSCOPY: SHX174

## 2016-08-25 MED ORDER — SODIUM CHLORIDE 0.9 % IV SOLN: 500.0000 mL | INTRAVENOUS | Status: DC

## 2016-08-25 NOTE — Op Note (Signed)
[place] Patient Name: Joann Page Procedure Date: 08/25/2016 9:20 AM MRN: [id number] Endoscopist: Mauri Pole , MD Age: 55 Referring MD:  Date of Birth: [birth year] Gender: Female Account #: [id number] Procedure:                Colonoscopy Indications:              Screening in patient at increased risk: Family                            history of 1st-degree relative with colorectal                            cancer before age 53 years (Father age 59), Last                            colonoscopy 2012 Medicines:                Monitored Anesthesia Care Procedure:                Pre-Anesthesia Assessment:                           - Prior to the procedure, a History and Physical                            was performed, and patient medications and                            allergies were reviewed. The patient's tolerance of                            previous anesthesia was also reviewed. The risks                            and benefits of the procedure and the sedation                            options and risks were discussed with the patient.                            All questions were answered, and informed consent                            was obtained. Prior Anticoagulants: The patient has                            taken no previous anticoagulant or antiplatelet                            agents. ASA Grade Assessment: II - A patient with                            mild systemic disease. After reviewing the risks  and benefits, the patient was deemed in                            satisfactory condition to undergo the procedure.                           After obtaining informed consent, the colonoscope                            was passed under direct vision. Throughout the                            procedure, the patient's blood pressure, pulse, and                            oxygen saturations were monitored continuously.  The                            Model CF-HQ190L [id number]) scope was introduced                            through the anus and advanced to the the cecum,                            identified by appendiceal orifice and ileocecal                            valve. The colonoscopy was performed without                            difficulty. The patient tolerated the procedure                            well. The quality of the bowel preparation was                            excellent. The ileocecal valve, appendiceal                            orifice, and rectum were photographed. Scope In: 9:33:36 AM Scope Out: 9:50:43 AM Scope Withdrawal Time: 0 hours 5 minutes 52 seconds  Total Procedure Duration: 0 hours 17 minutes 7 seconds  Findings:                 The perianal and digital rectal examinations were                            normal.                           Internal hemorrhoids were found during                            retroflexion. The hemorrhoids were small.  The exam was otherwise without abnormality. Complications:            No immediate complications. Estimated Blood Loss:     Estimated blood loss: none. Impression:               - Internal hemorrhoids.                           - The examination was otherwise normal.                           - No specimens collected. Recommendation:           - Patient has a contact number available for                            emergencies. The signs and symptoms of potential                            delayed complications were discussed with the                            patient. Return to normal activities tomorrow.                            Written discharge instructions were provided to the                            patient.                           - Resume previous diet.                           - Continue present medications.                           - Repeat colonoscopy in 5 years for  screening                            purposes.                           - Return to GI clinic PRN. Mauri Pole, MD 08/25/2016 9:54:12 AM This report has been signed electronically.

## 2016-08-25 NOTE — Progress Notes (Signed)
A and O x3. Report to RN. Tolerated MAC anesthesia well. 

## 2016-08-25 NOTE — Patient Instructions (Signed)
  Recommendations/Impressions:  Hand out given for hemorrhoids.  Repeat colonoscopy in 5 years 2022.     YOU HAD AN ENDOSCOPIC PROCEDURE TODAY AT [facility]:   Refer to the procedure report that was given to you for any specific questions about what was found during the examination.  If the procedure report does not answer your questions, please call your gastroenterologist to clarify.  If you requested that your care partner not be given the details of your procedure findings, then the procedure report has been included in a sealed envelope for you to review at your convenience later.  YOU SHOULD EXPECT: Some feelings of bloating in the abdomen. Passage of more gas than usual.  Walking can help get rid of the air that was put into your GI tract during the procedure and reduce the bloating. If you had a lower endoscopy (such as a colonoscopy or flexible sigmoidoscopy) you may notice spotting of blood in your stool or on the toilet paper. If you underwent a bowel prep for your procedure, you may not have a normal bowel movement for a few days.  Please Note:  You might notice some irritation and congestion in your nose or some drainage.  This is from the oxygen used during your procedure.  There is no need for concern and it should clear up in a day or so.  SYMPTOMS TO REPORT IMMEDIATELY:   Following lower endoscopy (colonoscopy or flexible sigmoidoscopy):  Excessive amounts of blood in the stool  Significant tenderness or worsening of abdominal pains  Swelling of the abdomen that is new, acute  Fever of 100F or higher  For urgent or emergent issues, a gastroenterologist can be reached at any hour by calling [phone].   DIET:  We do recommend a small meal at first, but then you may proceed to your regular diet.  Drink plenty of fluids but you should avoid alcoholic beverages for 24 hours.  ACTIVITY:  You should plan to take it easy for the rest of today and you  should NOT DRIVE or use heavy machinery until tomorrow (because of the sedation medicines used during the test).    FOLLOW UP: Our staff will call the number listed on your records the next business day following your procedure to check on you and address any questions or concerns that you may have regarding the information given to you following your procedure. If we do not reach you, we will leave a message.  However, if you are feeling well and you are not experiencing any problems, there is no need to return our call.  We will assume that you have returned to your regular daily activities without incident.  If any biopsies were taken you will be contacted by phone or by letter within the next 1-3 weeks.  Please call us at [phone] if you have not heard about the biopsies in 3 weeks.    SIGNATURES/CONFIDENTIALITY: You and/or your care partner have signed paperwork which will be entered into your electronic medical record.  These signatures attest to the fact that that the information above on your After Visit Summary has been reviewed and is understood.  Full responsibility of the confidentiality of this discharge information lies with you and/or your care-partner.

## 2016-08-26 ENCOUNTER — Telehealth: Payer: Self-pay

## 2016-08-26 NOTE — Telephone Encounter (Signed)
  Follow up Call-  Call back number 08/25/2016 02/06/2016 08/14/2014  Post procedure Call Back phone  # [phone]  Permission to leave phone message Yes Yes Yes  Some recent data might be hidden     Patient questions:  Do you have a fever, pain , or abdominal swelling? No. Pain Score  0 *  Have you tolerated food without any problems? Yes.    Have you been able to return to your normal activities? Yes.    Do you have any questions about your discharge instructions: Diet   No. Medications  No. Follow up visit  No.  Do you have questions or concerns about your Care? No.  Actions: * If pain score is 4 or above: No action needed, pain <4.

## 2016-09-20 ENCOUNTER — Other Ambulatory Visit: Payer: Self-pay | Admitting: Hematology and Oncology

## 2016-09-20 MED FILL — TAMOXIFEN 20 MG TABLET: 20 | 30 days supply | Qty: 30 | Fill #0

## 2016-09-20 NOTE — Telephone Encounter (Signed)
Chart reviewed.

## 2016-09-23 DIAGNOSIS — M545 Low back pain: Secondary | ICD-10-CM | POA: Diagnosis not present

## 2016-09-23 MED FILL — HYDROCODON-APAP 5-325: 5-325 | 30 days supply | Qty: 60 | Fill #0

## 2016-10-19 MED FILL — TAMOXIFEN 20 MG TABLET: 20 | 30 days supply | Qty: 30 | Fill #1

## 2016-10-20 MED FILL — HYDROCODON-APAP 5-325: 5-325 | 30 days supply | Qty: 60 | Fill #0

## 2016-10-25 MED FILL — CYCLOBENZAPRINE 10 MG TAB: 10 | 20 days supply | Qty: 60 | Fill #0

## 2016-11-18 MED FILL — TAMOXIFEN 20 MG TABLET: 20 | 30 days supply | Qty: 30 | Fill #2

## 2016-11-18 MED FILL — HYDROCODON-APAP 5-325: 5-325 | 30 days supply | Qty: 60 | Fill #0

## 2016-11-26 ENCOUNTER — Other Ambulatory Visit: Payer: Self-pay | Admitting: Nurse Practitioner

## 2016-12-17 MED FILL — HYDROCODON-APAP 5-325: 5-325 | 30 days supply | Qty: 60 | Fill #0

## 2016-12-17 MED FILL — TAMOXIFEN 20 MG TABLET: 20 | 30 days supply | Qty: 30 | Fill #3

## 2017-01-17 MED FILL — HYDROCODON-APAP 5-325: 5-325 | 30 days supply | Qty: 60 | Fill #0

## 2017-01-17 MED FILL — TAMOXIFEN 20 MG TABLET: 20 | 30 days supply | Qty: 30 | Fill #4

## 2017-02-11 ENCOUNTER — Encounter: Payer: Self-pay | Admitting: Podiatry

## 2017-02-11 ENCOUNTER — Ambulatory Visit (INDEPENDENT_AMBULATORY_CARE_PROVIDER_SITE_OTHER): Payer: BLUE CROSS/BLUE SHIELD | Admitting: Podiatry

## 2017-02-11 ENCOUNTER — Ambulatory Visit (INDEPENDENT_AMBULATORY_CARE_PROVIDER_SITE_OTHER): Payer: BLUE CROSS/BLUE SHIELD

## 2017-02-11 VITALS — BP 138/99 | HR 93 | Resp 16 | Ht 66.0 in | Wt 185.0 lb

## 2017-02-11 DIAGNOSIS — M722 Plantar fascial fibromatosis: Secondary | ICD-10-CM

## 2017-02-11 DIAGNOSIS — M79671 Pain in right foot: Secondary | ICD-10-CM

## 2017-02-11 MED ORDER — TRIAMCINOLONE ACETONIDE 10 MG/ML IJ SUSP
10.0000 mg | Freq: Once | INTRAMUSCULAR | Status: AC
  Administered 2017-02-11: 10 mg

## 2017-02-11 NOTE — Progress Notes (Signed)
   Subjective:    Patient ID: Joann Page, female    DOB: [birth year], 56 y.o.   MRN: [id number]  HPI Chief Complaint  Patient presents with  . Foot Pain    Right foot; bottom of heel; pt stated, "Wants to get an injection"      Review of Systems  All other systems reviewed and are negative.      Objective:   Physical Exam        Assessment & Plan:

## 2017-02-11 NOTE — Patient Instructions (Signed)

## 2017-02-13 NOTE — Progress Notes (Signed)
Subjective:     Patient ID: Joann Page, female   DOB: 11-17-1961, 56 y.o.   MRN: [id number]  HPI patient presents with significant pain to the right heel of several months duration states she's had a history of this which was treated well with cortisone   Review of Systems  All other systems reviewed and are negative.      Objective:   Physical Exam  Constitutional: She is oriented to person, place, and time.  Cardiovascular: Intact distal pulses.   Musculoskeletal: Normal range of motion.  Neurological: She is oriented to person, place, and time.  Skin: Skin is warm.  Nursing note and vitals reviewed.  neurovascular status intact muscle strength adequate range of motion within normal limits with patient found to have discomfort in the right plantar fashion at the insertional point the tendon the calcaneus with fluid buildup around the area moderate depression of the arch and no pain within the calcaneus itself     Assessment:     Acute plantar fasciitis right with inflammation fluid buildup    Plan:     H&P x-rays reviewed and careful plantar injections administered 3 mg Kenalog 5 mill grams Xylocaine and applied fascial brace. Patient was instructed him reduced activity physical therapy will be seen back to recheck  X-ray report indicate small spur formation with no indication of stress fracture advanced arthritis

## 2017-02-14 MED FILL — TAMOXIFEN 20 MG TABLET: 20 | 30 days supply | Qty: 30 | Fill #5

## 2017-02-14 MED FILL — HYDROCODON-APAP 5-325: 5-325 | 30 days supply | Qty: 60 | Fill #0

## 2017-02-24 ENCOUNTER — Ambulatory Visit (INDEPENDENT_AMBULATORY_CARE_PROVIDER_SITE_OTHER): Payer: BLUE CROSS/BLUE SHIELD | Admitting: Podiatry

## 2017-02-24 ENCOUNTER — Encounter: Payer: Self-pay | Admitting: Podiatry

## 2017-02-24 DIAGNOSIS — M722 Plantar fascial fibromatosis: Secondary | ICD-10-CM | POA: Diagnosis not present

## 2017-02-25 ENCOUNTER — Ambulatory Visit: Payer: BLUE CROSS/BLUE SHIELD | Admitting: Podiatry

## 2017-02-25 NOTE — Progress Notes (Signed)
Subjective:     Patient ID: Joann Page, female   DOB: 23-Feb-1961, 56 y.o.   MRN: [id number]  HPI patient presents stating she's doing quite a bit better but still having some discomfort with long activity   Review of Systems     Objective:   Physical Exam Neurovascular status found to be intact negative Homans sign was noted with patient found to have mild to moderate discomfort plantar heel right and moderate flatfoot deformity upon gait analysis.    Assessment:     Plantar fasciitis right at the insertional point tendon into the calcaneus    Plan:     Reviewed condition discussed physical therapy supportive shoe gear and not going barefoot. May require orthotics and reviewed over-the-counter orthotics that the patient has and she will be seen back to recheck

## 2017-03-14 MED FILL — TAMOXIFEN 20 MG TABLET: 20 | 30 days supply | Qty: 30 | Fill #6

## 2017-03-14 MED FILL — HYDROCODON-APAP 5-325: 5-325 | 30 days supply | Qty: 60 | Fill #0

## 2017-04-11 MED FILL — HYDROCODON-APAP 5-325: 5-325 | 30 days supply | Qty: 60 | Fill #0

## 2017-04-20 MED FILL — TAMOXIFEN 20 MG TABLET: 20 | 30 days supply | Qty: 30 | Fill #7

## 2017-04-22 ENCOUNTER — Encounter: Payer: Self-pay | Admitting: Internal Medicine

## 2017-04-22 ENCOUNTER — Ambulatory Visit (INDEPENDENT_AMBULATORY_CARE_PROVIDER_SITE_OTHER): Payer: BLUE CROSS/BLUE SHIELD | Admitting: Internal Medicine

## 2017-04-22 ENCOUNTER — Other Ambulatory Visit (INDEPENDENT_AMBULATORY_CARE_PROVIDER_SITE_OTHER): Payer: BLUE CROSS/BLUE SHIELD

## 2017-04-22 VITALS — BP 142/82 | HR 80 | Temp 97.8°F | Resp 12 | Ht 66.0 in | Wt 213.0 lb

## 2017-04-22 DIAGNOSIS — Z0000 Encounter for general adult medical examination without abnormal findings: Secondary | ICD-10-CM

## 2017-04-22 DIAGNOSIS — K219 Gastro-esophageal reflux disease without esophagitis: Secondary | ICD-10-CM | POA: Diagnosis not present

## 2017-04-22 DIAGNOSIS — R7989 Other specified abnormal findings of blood chemistry: Secondary | ICD-10-CM

## 2017-04-22 DIAGNOSIS — E669 Obesity, unspecified: Secondary | ICD-10-CM | POA: Diagnosis not present

## 2017-04-22 LAB — COMPREHENSIVE METABOLIC PANEL
ALT: 17 U/L (ref 0–35)
AST: 17 U/L (ref 0–37)
Albumin: 3.9 g/dL (ref 3.5–5.2)
Alkaline Phosphatase: 40 U/L (ref 39–117)
BUN: 10 mg/dL (ref 6–23)
CHLORIDE: 105 meq/L (ref 96–112)
CO2: 29 mEq/L (ref 19–32)
Calcium: 9.4 mg/dL (ref 8.4–10.5)
Creatinine, Ser: 0.74 mg/dL (ref 0.40–1.20)
GFR: 105.06 mL/min (ref >60.00)
GLUCOSE: 92 mg/dL (ref 70–99)
POTASSIUM: 4.6 meq/L (ref 3.5–5.1)
Sodium: 141 mEq/L (ref 135–145)
Total Bilirubin: 0.3 mg/dL (ref 0.2–1.2)
Total Protein: 6.8 g/dL (ref 6.0–8.3)

## 2017-04-22 LAB — HEMOGLOBIN A1C: Hgb A1c MFr Bld: 5.7 % (ref 4.6–6.5)

## 2017-04-22 LAB — LIPID PANEL
Cholesterol: 211 mg/dL — ABNORMAL HIGH (ref 0–200)
HDL: 58.7 mg/dL (ref >39.00)
NonHDL: 151.99
Total CHOL/HDL Ratio: 4
Triglycerides: 297 mg/dL — ABNORMAL HIGH (ref 0.0–149.0)
VLDL: 59.4 mg/dL — ABNORMAL HIGH (ref 0.0–40.0)

## 2017-04-22 LAB — CBC
HEMATOCRIT: 39.8 % (ref 36.0–46.0)
HEMOGLOBIN: 13.3 g/dL (ref 12.0–15.0)
MCHC: 33.6 g/dL (ref 30.0–36.0)
MCV: 86.7 fl (ref 78.0–100.0)
Platelets: 192 10*3/uL (ref 150.0–400.0)
RBC: 4.59 Mil/uL (ref 3.87–5.11)
RDW: 12.4 % (ref 11.5–15.5)
WBC: 4.5 10*3/uL (ref 4.0–10.5)

## 2017-04-22 LAB — LDL CHOLESTEROL, DIRECT: Direct LDL: 108 mg/dL

## 2017-04-22 LAB — VITAMIN D 25 HYDROXY (VIT D DEFICIENCY, FRACTURES): VITD: 26.76 ng/mL — ABNORMAL LOW (ref 30.00–100.00)

## 2017-04-22 MED ORDER — OMEPRAZOLE 20 MG PO CPDR: 20.0000 mg | DELAYED_RELEASE_CAPSULE | ORAL | 3 refills | Status: DC | PRN

## 2017-04-22 MED FILL — OMEPRAZOLE 20 MG CAPSULE DR: 20 | 30 days supply | Qty: 30 | Fill #0

## 2017-04-22 NOTE — Assessment & Plan Note (Signed)
Refill omeprazole which she takes for GERD with good success. Will continue therapy.

## 2017-04-22 NOTE — Progress Notes (Signed)
   Subjective:    Patient ID: Joann Page, female    DOB: 09-22-1961, 56 y.o.   MRN: [id number]  HPI The patient is a 56 YO female coming in for wellness. No new concerns.   PMH, [facility], social history reviewed and updated.   Review of Systems  Constitutional: Negative.   HENT: Negative.   Eyes: Negative.   Respiratory: Negative for cough, chest tightness and shortness of breath.   Cardiovascular: Negative for chest pain, palpitations and leg swelling.  Gastrointestinal: Negative for abdominal distention, abdominal pain, constipation, diarrhea, nausea and vomiting.  Musculoskeletal: Negative.   Skin: Negative.   Neurological: Negative.   Psychiatric/Behavioral: Negative.       Objective:   Physical Exam  Constitutional: She is oriented to person, place, and time. She appears well-developed and well-nourished.  HENT:  Head: Normocephalic and atraumatic.  Eyes: EOM are normal.  Neck: Normal range of motion.  Cardiovascular: Normal rate and regular rhythm.   Pulmonary/Chest: Effort normal and breath sounds normal. No respiratory distress. She has no wheezes. She has no rales.  Abdominal: Soft. Bowel sounds are normal. She exhibits no distension. There is no tenderness. There is no rebound.  Musculoskeletal: She exhibits no edema.  Neurological: She is alert and oriented to person, place, and time. Coordination normal.  Skin: Skin is warm and dry.  Psychiatric: She has a normal mood and affect.    Vitals:   04/22/17 1021 04/22/17 1050  BP: (!) 150/80 (!) 142/82  Pulse: 80   Resp: 12   Temp: 97.8 F (36.6 C)   TempSrc: Oral   SpO2: 98%   Weight: 213 lb (96.6 kg)   Height: 5\' 6"  (1.676 m)       Assessment & Plan:

## 2017-04-22 NOTE — Assessment & Plan Note (Signed)
Weight is up slightly from last year and advised to work on returning to diet and exercise changes and she will work on this.

## 2017-04-22 NOTE — Assessment & Plan Note (Signed)
Colonoscopy up to date, mammogram up to date, pap smear with gyn up to date. Declines need for hiv screening. Counseled on sun safety and the dangers of distracted driving. Counseled about shingrix but she is insistent she never had chicken pox. Given screening recommendations.

## 2017-04-22 NOTE — Patient Instructions (Signed)
We are checking the labs today and will call you back with the results.   Work on getting back to exercise 3 times per week.  Work on Passenger transport manager a priority and taking more time for self-care.  Health Maintenance, Female Adopting a healthy lifestyle and getting preventive care can go a long way to promote health and wellness. Talk with your health care provider about what schedule of regular examinations is right for you. This is a good chance for you to check in with your provider about disease prevention and staying healthy. In between checkups, there are plenty of things you can do on your own. Experts have done a lot of research about which lifestyle changes and preventive measures are most likely to keep you healthy. Ask your health care provider for more information. Weight and diet Eat a healthy diet  Be sure to include plenty of vegetables, fruits, low-fat dairy products, and lean protein.  Do not eat a lot of foods high in solid fats, added sugars, or salt.  Get regular exercise. This is one of the most important things you can do for your health.  Most adults should exercise for at least 150 minutes each week. The exercise should increase your heart rate and make you sweat (moderate-intensity exercise).  Most adults should also do strengthening exercises at least twice a week. This is in addition to the moderate-intensity exercise. Maintain a healthy weight  Body mass index (BMI) is a measurement that can be used to identify possible weight problems. It estimates body fat based on height and weight. Your health care provider can help determine your BMI and help you achieve or maintain a healthy weight.  For females 18 years of age and older:  A BMI below 18.5 is considered underweight.  A BMI of 18.5 to 24.9 is normal.  A BMI of 25 to 29.9 is considered overweight.  A BMI of 30 and above is considered obese. Watch levels of cholesterol and blood lipids  You should  start having your blood tested for lipids and cholesterol at 54 years of age, then have this test every 5 years.  You may need to have your cholesterol levels checked more often if:  Your lipid or cholesterol levels are high.  You are older than 54 years of age.  You are at high risk for heart disease. Cancer screening Lung Cancer  Lung cancer screening is recommended for adults 85-81 years old who are at high risk for lung cancer because of a history of smoking.  A yearly low-dose CT scan of the lungs is recommended for people who:  Currently smoke.  Have quit within the past 15 years.  Have at least a 30-pack-year history of smoking. A pack year is smoking an average of one pack of cigarettes a day for 1 year.  Yearly screening should continue until it has been 15 years since you quit.  Yearly screening should stop if you develop a health problem that would prevent you from having lung cancer treatment. Breast Cancer  Practice breast self-awareness. This means understanding how your breasts normally appear and feel.  It also means doing regular breast self-exams. Let your health care provider know about any changes, no matter how small.  If you are in your 20s or 30s, you should have a clinical breast exam (CBE) by a health care provider every 1-3 years as part of a regular health exam.  If you are 62 or older, have a CBE every  year. Also consider having a breast X-ray (mammogram) every year.  If you have a family history of breast cancer, talk to your health care provider about genetic screening.  If you are at high risk for breast cancer, talk to your health care provider about having an MRI and a mammogram every year.  Breast cancer gene (BRCA) assessment is recommended for women who have family members with BRCA-related cancers. BRCA-related cancers include:  Breast.  Ovarian.  Tubal.  Peritoneal cancers.  Results of the assessment will determine the need for  genetic counseling and BRCA1 and BRCA2 testing. Cervical Cancer  Your health care provider may recommend that you be screened regularly for cancer of the pelvic organs (ovaries, uterus, and vagina). This screening involves a pelvic examination, including checking for microscopic changes to the surface of your cervix (Pap test). You may be encouraged to have this screening done every 3 years, beginning at age 63.  For women ages 74-65, health care providers may recommend pelvic exams and Pap testing every 3 years, or they may recommend the Pap and pelvic exam, combined with testing for human papilloma virus (HPV), every 5 years. Some types of HPV increase your risk of cervical cancer. Testing for HPV may also be done on women of any age with unclear Pap test results.  Other health care providers may not recommend any screening for nonpregnant women who are considered low risk for pelvic cancer and who do not have symptoms. Ask your health care provider if a screening pelvic exam is right for you.  If you have had past treatment for cervical cancer or a condition that could lead to cancer, you need Pap tests and screening for cancer for at least 20 years after your treatment. If Pap tests have been discontinued, your risk factors (such as having a new sexual partner) need to be reassessed to determine if screening should resume. Some women have medical problems that increase the chance of getting cervical cancer. In these cases, your health care provider may recommend more frequent screening and Pap tests. Colorectal Cancer  This type of cancer can be detected and often prevented.  Routine colorectal cancer screening usually begins at 54 years of age and continues through 54 years of age.  Your health care provider may recommend screening at an earlier age if you have risk factors for colon cancer.  Your health care provider may also recommend using home test kits to check for hidden blood in the  stool.  A small camera at the end of a tube can be used to examine your colon directly (sigmoidoscopy or colonoscopy). This is done to check for the earliest forms of colorectal cancer.  Routine screening usually begins at age 80.  Direct examination of the colon should be repeated every 5-10 years through 54 years of age. However, you may need to be screened more often if early forms of precancerous polyps or small growths are found. Skin Cancer  Check your skin from head to toe regularly.  Tell your health care provider about any new moles or changes in moles, especially if there is a change in a mole's shape or color.  Also tell your health care provider if you have a mole that is larger than the size of a pencil eraser.  Always use sunscreen. Apply sunscreen liberally and repeatedly throughout the day.  Protect yourself by wearing long sleeves, pants, a wide-brimmed hat, and sunglasses whenever you are outside. Heart disease, diabetes, and high blood pressure  High blood pressure causes heart disease and increases the risk of stroke. High blood pressure is more likely to develop in:  People who have blood pressure in the high end of the normal range (130-139/85-89 mm Hg).  People who are overweight or obese.  People who are African American.  If you are 84-42 years of age, have your blood pressure checked every 3-5 years. If you are 71 years of age or older, have your blood pressure checked every year. You should have your blood pressure measured twice-once when you are at a hospital or clinic, and once when you are not at a hospital or clinic. Record the average of the two measurements. To check your blood pressure when you are not at a hospital or clinic, you can use:  An automated blood pressure machine at a pharmacy.  A home blood pressure monitor.  If you are between 69 years and 13 years old, ask your health care provider if you should take aspirin to prevent  strokes.  Have regular diabetes screenings. This involves taking a blood sample to check your fasting blood sugar level.  If you are at a normal weight and have a low risk for diabetes, have this test once every three years after 54 years of age.  If you are overweight and have a high risk for diabetes, consider being tested at a younger age or more often. Preventing infection Hepatitis B  If you have a higher risk for hepatitis B, you should be screened for this virus. You are considered at high risk for hepatitis B if:  You were born in a country where hepatitis B is common. Ask your health care provider which countries are considered high risk.  Your parents were born in a high-risk country, and you have not been immunized against hepatitis B (hepatitis B vaccine).  You have HIV or AIDS.  You use needles to inject street drugs.  You live with someone who has hepatitis B.  You have had sex with someone who has hepatitis B.  You get hemodialysis treatment.  You take certain medicines for conditions, including cancer, organ transplantation, and autoimmune conditions. Hepatitis C  Blood testing is recommended for:  Everyone born from [birth year] through 1963.  Anyone with known risk factors for hepatitis C. Sexually transmitted infections (STIs)  You should be screened for sexually transmitted infections (STIs) including gonorrhea and chlamydia if:  You are sexually active and are younger than 54 years of age.  You are older than 54 years of age and your health care provider tells you that you are at risk for this type of infection.  Your sexual activity has changed since you were last screened and you are at an increased risk for chlamydia or gonorrhea. Ask your health care provider if you are at risk.  If you do not have HIV, but are at risk, it may be recommended that you take a prescription medicine daily to prevent HIV infection. This is called pre-exposure prophylaxis  (PrEP). You are considered at risk if:  You are sexually active and do not regularly use condoms or know the HIV status of your partner(s).  You take drugs by injection.  You are sexually active with a partner who has HIV. Talk with your health care provider about whether you are at high risk of being infected with HIV. If you choose to begin PrEP, you should first be tested for HIV. You should then be tested every 3 months for as long  as you are taking PrEP. Pregnancy  If you are premenopausal and you may become pregnant, ask your health care provider about preconception counseling.  If you may become pregnant, take 400 to 800 micrograms (mcg) of folic acid every day.  If you want to prevent pregnancy, talk to your health care provider about birth control (contraception). Osteoporosis and menopause  Osteoporosis is a disease in which the bones lose minerals and strength with aging. This can result in serious bone fractures. Your risk for osteoporosis can be identified using a bone density scan.  If you are 7 years of age or older, or if you are at risk for osteoporosis and fractures, ask your health care provider if you should be screened.  Ask your health care provider whether you should take a calcium or vitamin D supplement to lower your risk for osteoporosis.  Menopause may have certain physical symptoms and risks.  Hormone replacement therapy may reduce some of these symptoms and risks. Talk to your health care provider about whether hormone replacement therapy is right for you. Follow these instructions at home:  Schedule regular health, dental, and eye exams.  Stay current with your immunizations.  Do not use any tobacco products including cigarettes, chewing tobacco, or electronic cigarettes.  If you are pregnant, do not drink alcohol.  If you are breastfeeding, limit how much and how often you drink alcohol.  Limit alcohol intake to no more than 1 drink per day for  nonpregnant women. One drink equals 12 ounces of beer, 5 ounces of wine, or 1 ounces of hard liquor.  Do not use street drugs.  Do not share needles.  Ask your health care provider for help if you need support or information about quitting drugs.  Tell your health care provider if you often feel depressed.  Tell your health care provider if you have ever been abused or do not feel safe at home. This information is not intended to replace advice given to you by your health care provider. Make sure you discuss any questions you have with your health care provider. Document Released: 05/31/2011 Document Revised: 04/22/2016 Document Reviewed: 08/19/2015 Elsevier Interactive Patient Education  2017 [employer].

## 2017-04-26 LAB — VARICELLA ZOSTER ANTIBODY, IGG: VARICELLA IGG: 581.8 {index} — AB (ref <135.00)

## 2017-04-27 ENCOUNTER — Telehealth: Payer: Self-pay | Admitting: Internal Medicine

## 2017-04-27 NOTE — Telephone Encounter (Signed)
Pt would like a call back with her results from 5/25

## 2017-04-27 NOTE — Telephone Encounter (Signed)
Labs not released yet

## 2017-04-28 ENCOUNTER — Encounter: Payer: Self-pay | Admitting: Podiatry

## 2017-04-28 ENCOUNTER — Ambulatory Visit (INDEPENDENT_AMBULATORY_CARE_PROVIDER_SITE_OTHER): Payer: BLUE CROSS/BLUE SHIELD | Admitting: Podiatry

## 2017-04-28 DIAGNOSIS — M722 Plantar fascial fibromatosis: Secondary | ICD-10-CM

## 2017-04-28 DIAGNOSIS — M779 Enthesopathy, unspecified: Secondary | ICD-10-CM

## 2017-04-28 MED ORDER — TRIAMCINOLONE ACETONIDE 10 MG/ML IJ SUSP
10.0000 mg | Freq: Once | INTRAMUSCULAR | Status: AC
  Administered 2017-04-28: 10 mg

## 2017-04-28 NOTE — Progress Notes (Signed)
Subjective:    Patient ID: Joann Page, female   DOB: 56 y.o.   MRN: [id number]   HPI patient presents stating that the ball of the foot is really bothering her and the heel seems better with occasional discomfort    ROS      Objective:  Physical Exam neurovascular status intact with inflammation pain second MPJ right with fluid buildup and mild discomfort plantar heel right     Assessment:    2 separate problems with acute inflammation of the right second MPJ and fasciitis right     Plan:    H&P condition reviewed a Lilia Pro a focus on the capsulitis condition. I did proximal nerve block aspirated the joint getting out a small amount of clear fluid and injected quarter cc dexamethasone Kenalog and applied padding to reduce pressure on the joint. Discussed continued fascial relief using physical therapy supportive shoe gear usage

## 2017-05-09 MED FILL — HYDROCODON-APAP 5-325: 5-325 | 30 days supply | Qty: 60 | Fill #0

## 2017-05-10 ENCOUNTER — Ambulatory Visit: Payer: BLUE CROSS/BLUE SHIELD | Admitting: Hematology and Oncology

## 2017-05-10 NOTE — Assessment & Plan Note (Deleted)
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 She does have hot flashes intermittently.  Breast Cancer Surveillance: 1. Breast exam 05/10/2017: Normal 2. 6 month follow up Mammogramdone at [facility] 07/22/16 benign. return to clinic in 1 year for follow-up

## 2017-05-11 ENCOUNTER — Ambulatory Visit (HOSPITAL_BASED_OUTPATIENT_CLINIC_OR_DEPARTMENT_OTHER): Payer: BLUE CROSS/BLUE SHIELD | Admitting: Hematology and Oncology

## 2017-05-11 ENCOUNTER — Encounter: Payer: Self-pay | Admitting: Hematology and Oncology

## 2017-05-11 DIAGNOSIS — Z170 Estrogen receptor positive status [ER+]: Secondary | ICD-10-CM | POA: Diagnosis not present

## 2017-05-11 DIAGNOSIS — Z79810 Long term (current) use of selective estrogen receptor modulators (SERMs): Secondary | ICD-10-CM | POA: Diagnosis not present

## 2017-05-11 DIAGNOSIS — C50512 Malignant neoplasm of lower-outer quadrant of left female breast: Secondary | ICD-10-CM

## 2017-05-11 MED ORDER — TAMOXIFEN CITRATE 20 MG PO TABS: 20.0000 mg | ORAL_TABLET | Freq: Every day | ORAL | 3 refills | Status: DC

## 2017-05-11 NOTE — Progress Notes (Signed)
Patient Care Team: Hoyt Koch, MD as PCP - General (Internal Medicine) Berle Mull, MD as Consulting Physician (Family Medicine) Lafayette Dragon, MD (Inactive) as Consulting Physician (Gastroenterology) Himmelrich, Bryson Ha, RD (Inactive) as Dietitian  DIAGNOSIS:  Encounter Diagnosis  Name Primary?  . Malignant neoplasm of lower-outer quadrant of left breast of female, estrogen receptor positive ([place])     SUMMARY OF ONCOLOGIC HISTORY:   Breast cancer of lower-outer quadrant of left female breast ([place])   07/03/2012 Initial Diagnosis    Left: IDC, MRI 2.3 cm mass      07/17/2012 Surgery    Left Lumpectomy: 1.7 cm, IDC, High grade, 4 SN neg,  with HG DCIS with necrosis, Oncotype Dx Intermed grade      07/31/2012 - 11/23/2012 Chemotherapy    Adjuvant chemo TC x 4      11/30/2012 - 01/12/2013 Radiation Therapy    Adjuvant XRT      01/18/2013 -  Anti-estrogen oral therapy    Adjuvant  tamoxifen 10 years       CHIEF COMPLIANT: Follow-up on tamoxifen therapy  INTERVAL HISTORY: Joann Page is a 56 year old with above-mentioned history left breast cancer who underwent lumpectomy followed by adjuvant chemotherapy and radiation. She is currently on tamoxifen and tolerating it extremely well. She denies any significant hot flashes or myalgias. She denies any lumps or nodules in breast. Left breast always appears to have some sensitivity probably related to prior radiation  REVIEW OF SYSTEMS:   Constitutional: Denies fevers, chills or abnormal weight loss Eyes: Denies blurriness of vision Ears, nose, mouth, throat, and face: Denies mucositis or sore throat Respiratory: Denies cough, dyspnea or wheezes Cardiovascular: Denies palpitation, chest discomfort Gastrointestinal:  Denies nausea, heartburn or change in bowel habits Skin: Denies abnormal skin rashes Lymphatics: Denies new lymphadenopathy or easy bruising Neurological:Denies numbness, tingling or new  weaknesses Behavioral/Psych: Mood is stable, no new changes  Extremities: No lower extremity edema Breast:  denies any pain or lumps or nodules in either breasts All other systems were reviewed with the patient and are negative.  I have reviewed the past medical history, past surgical history, social history and family history with the patient and they are unchanged from previous note.  ALLERGIES:  is allergic to sulfa antibiotics.  MEDICATIONS:  Current Outpatient Prescriptions  Medication Sig Dispense Refill  . omeprazole (PRILOSEC) 20 MG capsule Take 1 capsule (20 mg total) by mouth as needed. 90 capsule 3  . tamoxifen (NOLVADEX) 20 MG tablet TAKE 1 TABLET BY MOUTH ONCE DAILY 90 tablet 2   No current facility-administered medications for this visit.     PHYSICAL EXAMINATION: ECOG PERFORMANCE STATUS: 1 - Symptomatic but completely ambulatory  Vitals:   05/11/17 1515  BP: 139/78  Pulse: 94  Resp: 18  Temp: 98.2 F (36.8 C)   Filed Weights   05/11/17 1515  Weight: 216 lb (98 kg)    GENERAL:alert, no distress and comfortable SKIN: skin color, texture, turgor are normal, no rashes or significant lesions EYES: normal, Conjunctiva are pink and non-injected, sclera clear OROPHARYNX:no exudate, no erythema and lips, buccal mucosa, and tongue normal  NECK: supple, thyroid normal size, non-tender, without nodularity LYMPH:  no palpable lymphadenopathy in the cervical, axillary or inguinal LUNGS: clear to auscultation and percussion with normal breathing effort HEART: regular rate & rhythm and no murmurs and no lower extremity edema ABDOMEN:abdomen soft, non-tender and normal bowel sounds MUSCULOSKELETAL:no cyanosis of digits and no clubbing  NEURO: alert &  oriented x 3 with fluent speech, no focal motor/sensory deficits EXTREMITIES: No lower extremity edema BREAST: No palpable masses or nodules in either right or left breasts. No palpable axillary supraclavicular or  infraclavicular adenopathy no breast tenderness or nipple discharge. (exam performed in the presence of a chaperone)  LABORATORY DATA:  I have reviewed the data as listed   Chemistry      Component Value Date/Time   NA 141 04/22/2017 1055   NA 141 05/11/2016 1447   K 4.6 04/22/2017 1055   K 4.5 05/11/2016 1447   CL 105 04/22/2017 1055   CL 108 (H) 04/25/2013 1340   CO2 29 04/22/2017 1055   CO2 28 05/11/2016 1447   BUN 10 04/22/2017 1055   BUN 9.6 05/11/2016 1447   CREATININE 0.74 04/22/2017 1055   CREATININE 0.8 05/11/2016 1447      Component Value Date/Time   CALCIUM 9.4 04/22/2017 1055   CALCIUM 9.5 05/11/2016 1447   ALKPHOS 40 04/22/2017 1055   ALKPHOS 45 05/11/2016 1447   AST 17 04/22/2017 1055   AST 25 05/11/2016 1447   ALT 17 04/22/2017 1055   ALT 28 05/11/2016 1447   BILITOT 0.3 04/22/2017 1055   BILITOT <0.30 05/11/2016 1447       Lab Results  Component Value Date   WBC 4.5 04/22/2017   HGB 13.3 04/22/2017   HCT 39.8 04/22/2017   MCV 86.7 04/22/2017   PLT 192.0 04/22/2017   NEUTROABS 2.7 05/11/2016    ASSESSMENT & PLAN:  Breast cancer of lower-outer quadrant of left female breast ([place]) 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 She does have hot flashes intermittently.  Breast Cancer Surveillance: 1. Breast exam 05/11/2017: Normal 2. [facility]amm[facility] at [facility] February 2018 benign, patient has a small complex cyst/intramammary lymph node that was being watched  return to clinic in 1 year for follow-up  I spent 15 minutes talking to the patient of which more than half was spent in counseling and coordination of care.  No orders of the defined types were placed in this  encounter.  The patient has a good understanding of the overall plan. she agrees with it. she will call with any problems that may develop before the next visit here.   Rulon Eisenmenger, MD 05/11/17

## 2017-05-11 NOTE — Telephone Encounter (Signed)
Pt would like a call back regarding her results of her varicella titer she has done.

## 2017-05-11 NOTE — Assessment & Plan Note (Addendum)
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 She does have hot flashes intermittently.  Breast Cancer Surveillance: 1. Breast exam 05/11/2017: Normal 2. Mammogram[facility] at [facility] February 2018 benign, patient has a small complex cyst/intramammary lymph node that was being watched  return to clinic in 1 year for follow-up

## 2017-05-12 NOTE — Telephone Encounter (Signed)
Please advise on labs and titer

## 2017-05-14 NOTE — Telephone Encounter (Signed)
Varicella titer is positive for immunity.

## 2017-05-16 NOTE — Telephone Encounter (Signed)
Patient has been informed.

## 2017-05-19 ENCOUNTER — Ambulatory Visit: Payer: BLUE CROSS/BLUE SHIELD | Admitting: Podiatry

## 2017-05-20 MED FILL — TAMOXIFEN 20 MG TABLET: 20 | 30 days supply | Qty: 30 | Fill #8

## 2017-06-06 DIAGNOSIS — M545 Low back pain: Secondary | ICD-10-CM | POA: Diagnosis not present

## 2017-06-06 MED FILL — HYDROCODON-APAP 5-325: 5-325 | 30 days supply | Qty: 60 | Fill #0

## 2017-06-09 ENCOUNTER — Ambulatory Visit (INDEPENDENT_AMBULATORY_CARE_PROVIDER_SITE_OTHER): Payer: BLUE CROSS/BLUE SHIELD | Admitting: Podiatry

## 2017-06-09 DIAGNOSIS — M722 Plantar fascial fibromatosis: Secondary | ICD-10-CM | POA: Diagnosis not present

## 2017-06-09 MED ORDER — TRIAMCINOLONE ACETONIDE 10 MG/ML IJ SUSP
10.0000 mg | Freq: Once | INTRAMUSCULAR | Status: AC
  Administered 2017-06-09: 10 mg

## 2017-06-10 ENCOUNTER — Telehealth: Payer: Self-pay | Admitting: Podiatry

## 2017-06-10 NOTE — Progress Notes (Signed)
Subjective:    Patient ID: Joann Page, female   DOB: 56 y.o.   MRN: [id number]   HPI patient presents with intense discomfort right plantar fascia that she states was better for a period of time and is now reoccur worse in the morning or after periods of sitting    ROS      Objective:  Physical Exam neurovascular status intact with patient found to have exquisite discomfort plantar heel right at the insertional point of the tendon into the calcaneus with inflammation fluid around the medial band     Assessment:   Reoccurrence of significant plantar fascial symptomatology right      Plan:   H&P condition reviewed and injected the plantar fascial right 3 mg Kenalog 5 mg Xylocaine dispensed night splint with instructions on usage along with aggressive ice therapy reduced activity and supportive shoes. Reappoint 3 weeks

## 2017-06-10 NOTE — Telephone Encounter (Signed)
Yes, I saw Dr. Paulla Dolly yesterday and I have a question, so if you could, please call me back at [phone]. Thank you.

## 2017-06-10 NOTE — Telephone Encounter (Signed)
Pt states she thought Dr. Paulla Dolly was going to prescribe an antiinflammatory medication. I reviewed LOV and he had not prescribed a medication, but had given a steroid injection. I told pt the injection had the antiinflammatory medication in it and to be conscious of performing the aggressive ice therapy ordered by Dr. Paulla Dolly.

## 2017-06-17 ENCOUNTER — Ambulatory Visit (INDEPENDENT_AMBULATORY_CARE_PROVIDER_SITE_OTHER): Payer: BLUE CROSS/BLUE SHIELD | Admitting: Family Medicine

## 2017-06-17 ENCOUNTER — Encounter: Payer: Self-pay | Admitting: Family Medicine

## 2017-06-17 ENCOUNTER — Other Ambulatory Visit: Payer: BLUE CROSS/BLUE SHIELD

## 2017-06-17 VITALS — BP 130/78 | HR 87 | Temp 97.9°F | Resp 12 | Ht 66.0 in | Wt 216.0 lb

## 2017-06-17 DIAGNOSIS — N3090 Cystitis, unspecified without hematuria: Secondary | ICD-10-CM

## 2017-06-17 DIAGNOSIS — R350 Frequency of micturition: Secondary | ICD-10-CM

## 2017-06-17 LAB — POC URINALSYSI DIPSTICK (AUTOMATED)
BILIRUBIN UA: NEGATIVE
GLUCOSE UA: NEGATIVE
Ketones, UA: NEGATIVE
NITRITE UA: POSITIVE
Protein, UA: NEGATIVE
Spec Grav, UA: 1.025 (ref 1.010–1.025)
Urobilinogen, UA: 0.2 E.U./dL
pH, UA: 6 (ref 5.0–8.0)

## 2017-06-17 MED ORDER — NITROFURANTOIN MONOHYD MACRO 100 MG PO CAPS: 100.0000 mg | ORAL_CAPSULE | Freq: Two times a day (BID) | ORAL | 0 refills | Status: DC

## 2017-06-17 MED FILL — NITROFURANTOIN MONO-MCR 100: 100 | 7 days supply | Qty: 14 | Fill #0

## 2017-06-17 NOTE — Progress Notes (Signed)
Subjective:    Patient ID: Joann Page, female    DOB: [birth year], 56 y.o.   MRN: [id number]  HPI This is a 56 yo female who presents today with urinary frequency, burning, and lower abdominal pressure x  2 days. No fever/chills, no nausea or vomiting. Has had cystitis in past, not in several years. Is concerned about blood pressure going up over time.   Past Medical History:  Diagnosis Date  . 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 Breast cancer ([place]) 07/17/12   at age 86, ER/PR +, hER 2 -  . Cancer ([place]) 07/07/12   Left Breast Inv Ductal Ca.  . Degenerative disk disease 2010   Lumbar spine  . GERD (gastroesophageal reflux disease)    hx  . H/O hiatal hernia    small  . Hyperlipidemia   . Knee pain, right   . No pertinent past medical history   . Obesity   . S/P radiation therapy 11/30/12 - 01/12/13   Left Breast / 50 gray in 25 Fractions with boost 10 gray in 5 Fractions  . Status post chemotherapy comp 10/27/12    4 CyclesTaxotere/ Cytoxan  . Status post chemotherapy Comp. 01/12/13   neoadjuvant chemotherapy: 4 Cycles of Taxotere/Cytoxan  . Use of tamoxifen (Nolvadex) 01/18/13   Past Surgical History:  Procedure Laterality Date  . ABDOMINAL HYSTERECTOMY  04/2000   partial, fibroids  . 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  Marland Kitchen BREATH TEK H PYLORI  12/07/2011   Procedure: BREATH TEK H PYLORI;  Surgeon: Shann Medal, MD;  Location: [facility];  Service: Endoscopy;  Laterality: N/A;  . COLONOSCOPY    . Needle Core Biopsy  06/30/12   Left Breast: 12 O'clock/ Benign Parenchyma   Family History  Problem Relation Age of Onset  . Colon cancer Father 62  . Colon polyps Brother   . Breast cancer Sister 81  . Obesity Sister   . Lymphoma Brother   .  Esophageal cancer Neg Hx   . Stomach cancer Neg Hx   . Rectal cancer Neg Hx    Social History  Substance Use Topics  . Smoking status: Never Smoker  . Smokeless tobacco: Never Used  . Alcohol use No     Comment: menses age 17, no HRT or birth control pills       Review of Systems Per HPI    Objective:   Physical Exam  Constitutional: She is oriented to person, place, and time. She appears well-developed and well-nourished.  HENT:  Head: Atraumatic.  Cardiovascular: Normal rate, regular rhythm and normal heart sounds.   Abdominal: Soft. She exhibits no distension. There is no tenderness. There is no CVA tenderness.  Neurological: She is alert and oriented to person, place, and time.  Skin: Skin is warm and dry.  Psychiatric: She has a normal mood and affect. Her behavior is normal. Judgment and thought content normal.  Vitals reviewed.     BP 130/78 (BP Location: Right Arm, Patient Position: Sitting, Cuff Size: Large)   Pulse 87   Temp 97.9 F (36.6 C) (Oral)   Resp 12   Ht 5\' 6"  (1.676 m)   Wt 216 lb (98 kg)   SpO2 99%   BMI 34.86  kg/m  Wt Readings from Last 3 Encounters:  06/17/17 216 lb (98 kg)  05/11/17 216 lb (98 kg)  04/22/17 213 lb (96.6 kg)   Results for orders placed or performed in visit on 06/17/17  POCT Urinalysis Dipstick (Automated)  Result Value Ref Range   Color, UA dark yellow    Clarity, UA cloudy    Glucose, UA neg    Bilirubin, UA neg    Ketones, UA neg    Spec Grav, UA 1.025 1.010 - 1.025   Blood, UA 3+    pH, UA 6.0 5.0 - 8.0   Protein, UA neg    Urobilinogen, UA 0.2 0.2 or 1.0 E.U./dL   Nitrite, UA positive    Leukocytes, UA Trace (A) Negative       Assessment & Plan:  1. Urinary frequency - POCT Urinalysis Dipstick (Automated) - Urine Culture; Future  2. Cystitis - Provided written and verbal information regarding diagnosis and treatment. - nitrofurantoin, macrocrystal-monohydrate, (MACROBID) 100 MG capsule; Take 1 capsule  (100 mg total) by mouth 2 (two) times daily.  Dispense: 14 capsule; Refill: 0 - RTC precautions reviewed  - blood pressure normal, discussed avoiding excessive salt, exercise regularly  Clarene Reamer, FNP-BC  [facility] [place]  06/17/2017 1:42 PM

## 2017-06-17 NOTE — Patient Instructions (Addendum)
DASH Eating Plan DASH stands for "Dietary Approaches to Stop Hypertension." The DASH eating plan is a healthy eating plan that has been shown to reduce high blood pressure (hypertension). It may also reduce your risk for type 2 diabetes, heart disease, and stroke. The DASH eating plan may also help with weight loss. What are tips for following this plan? General guidelines  Avoid eating more than 2,300 mg (milligrams) of salt (sodium) a day. If you have hypertension, you may need to reduce your sodium intake to 1,500 mg a day.  Limit alcohol intake to no more than 1 drink a day for nonpregnant women and 2 drinks a day for men. One drink equals 12 oz of beer, 5 oz of wine, or 1 oz of hard liquor.  Work with your health care provider to maintain a healthy body weight or to lose weight. Ask what an ideal weight is for you.  Get at least 30 minutes of exercise that causes your heart to beat faster (aerobic exercise) most days of the week. Activities may include walking, swimming, or biking.  Work with your health care provider or diet and nutrition specialist (dietitian) to adjust your eating plan to your individual calorie needs. Reading food labels  Check food labels for the amount of sodium per serving. Choose foods with less than 5 percent of the Daily Value of sodium. Generally, foods with less than 300 mg of sodium per serving fit into this eating plan.  To find whole grains, look for the word "whole" as the first word in the ingredient list. Shopping  Buy products labeled as "low-sodium" or "no salt added."  Buy fresh foods. Avoid canned foods and premade or frozen meals. Cooking  Avoid adding salt when cooking. Use salt-free seasonings or herbs instead of table salt or sea salt. Check with your health care provider or pharmacist before using salt substitutes.  Do not fry foods. Cook foods using healthy methods such as baking, boiling, grilling, and broiling instead.  Cook with  heart-healthy oils, such as olive, canola, soybean, or sunflower oil. Meal planning   Eat a balanced diet that includes: ? 5 or more servings of fruits and vegetables each day. At each meal, try to fill half of your plate with fruits and vegetables. ? Up to 6-8 servings of whole grains each day. ? Less than 6 oz of lean meat, poultry, or fish each day. A 3-oz serving of meat is about the same size as a deck of cards. One egg equals 1 oz. ? 2 servings of low-fat dairy each day. ? A serving of nuts, seeds, or beans 5 times each week. ? Heart-healthy fats. Healthy fats called Omega-3 fatty acids are found in foods such as flaxseeds and coldwater fish, like sardines, salmon, and mackerel.  Limit how much you eat of the following: ? Canned or prepackaged foods. ? Food that is high in trans fat, such as fried foods. ? Food that is high in saturated fat, such as fatty meat. ? Sweets, desserts, sugary drinks, and other foods with added sugar. ? Full-fat dairy products.  Do not salt foods before eating.  Try to eat at least 2 vegetarian meals each week.  Eat more home-cooked food and less restaurant, buffet, and fast food.  When eating at a restaurant, ask that your food be prepared with less salt or no salt, if possible. What foods are recommended? The items listed may not be a complete list. Talk with your dietitian about what   dietary choices are best for you. Grains Whole-grain or whole-wheat bread. Whole-grain or whole-wheat pasta. Brown rice. Oatmeal. Quinoa. Bulgur. Whole-grain and low-sodium cereals. Pita bread. Low-fat, low-sodium crackers. Whole-wheat flour tortillas. Vegetables Fresh or frozen vegetables (raw, steamed, roasted, or grilled). Low-sodium or reduced-sodium tomato and vegetable juice. Low-sodium or reduced-sodium tomato sauce and tomato paste. Low-sodium or reduced-sodium canned vegetables. Fruits All fresh, dried, or frozen fruit. Canned fruit in natural juice (without  added sugar). Meat and other protein foods Skinless chicken or turkey. Ground chicken or turkey. Pork with fat trimmed off. Fish and seafood. Egg whites. Dried beans, peas, or lentils. Unsalted nuts, nut butters, and seeds. Unsalted canned beans. Lean cuts of beef with fat trimmed off. Low-sodium, lean deli meat. Dairy Low-fat (1%) or fat-free (skim) milk. Fat-free, low-fat, or reduced-fat cheeses. Nonfat, low-sodium ricotta or cottage cheese. Low-fat or nonfat yogurt. Low-fat, low-sodium cheese. Fats and oils Soft margarine without trans fats. Vegetable oil. Low-fat, reduced-fat, or light mayonnaise and salad dressings (reduced-sodium). Canola, safflower, olive, soybean, and sunflower oils. Avocado. Seasoning and other foods Herbs. Spices. Seasoning mixes without salt. Unsalted popcorn and pretzels. Fat-free sweets. What foods are not recommended? The items listed may not be a complete list. Talk with your dietitian about what dietary choices are best for you. Grains Baked goods made with fat, such as croissants, muffins, or some breads. Dry pasta or rice meal packs. Vegetables Creamed or fried vegetables. Vegetables in a cheese sauce. Regular canned vegetables (not low-sodium or reduced-sodium). Regular canned tomato sauce and paste (not low-sodium or reduced-sodium). Regular tomato and vegetable juice (not low-sodium or reduced-sodium). Pickles. Olives. Fruits Canned fruit in a light or heavy syrup. Fried fruit. Fruit in cream or butter sauce. Meat and other protein foods Fatty cuts of meat. Ribs. Fried meat. Bacon. Sausage. Bologna and other processed lunch meats. Salami. Fatback. Hotdogs. Bratwurst. Salted nuts and seeds. Canned beans with added salt. Canned or smoked fish. Whole eggs or egg yolks. Chicken or turkey with skin. Dairy Whole or 2% milk, cream, and half-and-half. Whole or full-fat cream cheese. Whole-fat or sweetened yogurt. Full-fat cheese. Nondairy creamers. Whipped toppings.  Processed cheese and cheese spreads. Fats and oils Butter. Stick margarine. Lard. Shortening. Ghee. Bacon fat. Tropical oils, such as coconut, palm kernel, or palm oil. Seasoning and other foods Salted popcorn and pretzels. Onion salt, garlic salt, seasoned salt, table salt, and sea salt. Worcestershire sauce. Tartar sauce. Barbecue sauce. Teriyaki sauce. Soy sauce, including reduced-sodium. Steak sauce. Canned and packaged gravies. Fish sauce. Oyster sauce. Cocktail sauce. Horseradish that you find on the shelf. Ketchup. Mustard. Meat flavorings and tenderizers. Bouillon cubes. Hot sauce and Tabasco sauce. Premade or packaged marinades. Premade or packaged taco seasonings. Relishes. Regular salad dressings. Where to find more information:  National Heart, Lung, and Blood Institute: www.nhlbi.nih.gov  American Heart Association: www.heart.org Summary  The DASH eating plan is a healthy eating plan that has been shown to reduce high blood pressure (hypertension). It may also reduce your risk for type 2 diabetes, heart disease, and stroke.  With the DASH eating plan, you should limit salt (sodium) intake to 2,300 mg a day. If you have hypertension, you may need to reduce your sodium intake to 1,500 mg a day.  When on the DASH eating plan, aim to eat more fresh fruits and vegetables, whole grains, lean proteins, low-fat dairy, and heart-healthy fats.  Work with your health care provider or diet and nutrition specialist (dietitian) to adjust your eating plan to your individual   calorie needs. This information is not intended to replace advice given to you by your health care provider. Make sure you discuss any questions you have with your health care provider. Document Released: 11/04/2011 Document Revised: 11/08/2016 Document Reviewed: 11/08/2016 Elsevier Interactive Patient Education  2017 [employer]. Urinary Tract Infection, Adult A urinary tract infection (UTI) is an infection of any part of  the urinary tract, which includes the kidneys, ureters, bladder, and urethra. These organs make, store, and get rid of urine in the body. UTI can be a bladder infection (cystitis) or kidney infection (pyelonephritis). What are the causes? This infection may be caused by fungi, viruses, or bacteria. Bacteria are the most common cause of UTIs. This condition can also be caused by repeated incomplete emptying of the bladder during urination. What increases the risk? This condition is more likely to develop if:  You ignore your need to urinate or hold urine for long periods of time.  You do not empty your bladder completely during urination.  You wipe back to front after urinating or having a bowel movement, if you are female.  You are uncircumcised, if you are female.  You are constipated.  You have a urinary catheter that stays in place (indwelling).  You have a weak defense (immune) system.  You have a medical condition that affects your bowels, kidneys, or bladder.  You have diabetes.  You take antibiotic medicines frequently or for long periods of time, and the antibiotics no longer work well against certain types of infections (antibiotic resistance).  You take medicines that irritate your urinary tract.  You are exposed to chemicals that irritate your urinary tract.  You are female.  What are the signs or symptoms? Symptoms of this condition include:  Fever.  Frequent urination or passing small amounts of urine frequently.  Needing to urinate urgently.  Pain or burning with urination.  Urine that smells bad or unusual.  Cloudy urine.  Pain in the lower abdomen or back.  Trouble urinating.  Blood in the urine.  Vomiting or being less hungry than normal.  Diarrhea or abdominal pain.  Vaginal discharge, if you are female.  How is this diagnosed? This condition is diagnosed with a medical history and physical exam. You will also need to provide a urine sample  to test your urine. Other tests may be done, including:  Blood tests.  Sexually transmitted disease (STD) testing.  If you have had more than one UTI, a cystoscopy or imaging studies may be done to determine the cause of the infections. How is this treated? Treatment for this condition often includes a combination of two or more of the following:  Antibiotic medicine.  Other medicines to treat less common causes of UTI.  Over-the-counter medicines to treat pain.  Drinking enough water to stay hydrated.  Follow these instructions at home:  Take over-the-counter and prescription medicines only as told by your health care provider.  If you were prescribed an antibiotic, take it as told by your health care provider. Do not stop taking the antibiotic even if you start to feel better.  Avoid alcohol, caffeine, tea, and carbonated beverages. They can irritate your bladder.  Drink enough fluid to keep your urine clear or pale yellow.  Keep all follow-up visits as told by your health care provider. This is important.  Make sure to: ? Empty your bladder often and completely. Do not hold urine for long periods of time. ? Empty your bladder before and after sex. ?  Wipe from front to back after a bowel movement if you are female. Use each tissue one time when you wipe. Contact a health care provider if:  You have back pain.  You have a fever.  You feel nauseous or vomit.  Your symptoms do not get better after 3 days.  Your symptoms go away and then return. Get help right away if:  You have severe back pain or lower abdominal pain.  You are vomiting and cannot keep down any medicines or water. This information is not intended to replace advice given to you by your health care provider. Make sure you discuss any questions you have with your health care provider. Document Released: 08/25/2005 Document Revised: 04/28/2016 Document Reviewed: 10/06/2015 Elsevier Interactive Patient  Education  2017 [employer].

## 2017-06-20 LAB — URINE CULTURE

## 2017-06-20 MED FILL — TAMOXIFEN 20 MG TABLET: 20 | 30 days supply | Qty: 30 | Fill #0

## 2017-06-20 MED FILL — OMEPRAZOLE 20 MG CAP: 20 | 30 days supply | Qty: 30 | Fill #1

## 2017-07-04 MED FILL — HYDROCODON-APAP 5-325: 5-325 | 30 days supply | Qty: 60 | Fill #0

## 2017-07-06 DIAGNOSIS — M79673 Pain in unspecified foot: Secondary | ICD-10-CM

## 2017-07-11 ENCOUNTER — Ambulatory Visit: Payer: BLUE CROSS/BLUE SHIELD | Admitting: Podiatry

## 2017-07-14 ENCOUNTER — Ambulatory Visit (INDEPENDENT_AMBULATORY_CARE_PROVIDER_SITE_OTHER): Payer: BLUE CROSS/BLUE SHIELD | Admitting: Podiatry

## 2017-07-14 ENCOUNTER — Encounter: Payer: Self-pay | Admitting: Podiatry

## 2017-07-14 DIAGNOSIS — M722 Plantar fascial fibromatosis: Secondary | ICD-10-CM

## 2017-07-14 MED ORDER — CYCLOBENZAPRINE HCL 10 MG PO TABS: 10.0000 mg | ORAL_TABLET | Freq: Three times a day (TID) | ORAL | 0 refills | Status: DC | PRN

## 2017-07-14 MED FILL — CYCLOBENZAPRINE 10 MG TABLE: 10 | 10 days supply | Qty: 30 | Fill #0

## 2017-07-15 MED FILL — TAMOXIFEN 20 MG TABLET: 20 | 30 days supply | Qty: 30 | Fill #1

## 2017-07-15 NOTE — Progress Notes (Signed)
Subjective:    Patient ID: Joann Page, female   DOB: 56 y.o.   MRN: [id number]   HPI patient presents stating I'm still been getting some trouble with my heel and I been getting some pain in my forefoot if I do too much walking and I feel like my feet are not supported well    ROS      Objective:  Physical Exam neurovascular status intact with patient noted to have continued inflammation in the plantar heel right that's improved but present with minimal discomfort in the metatarsophalangeal joints currently. She is developing some cramping when she is sleeping and in the evenings     Assessment:   Plantar fasciitis improved but present with mild capsulitis and cramping which may be due to gait change     Plan:   H&P condition reviewed. Due to the fact there still been some reoccurrence I do think it would be best to go ahead and get her into orthotics to try to give her better elevation of her arch and also take some of the stress off her forefeet. Patient is scheduled with head orthotist for orthotic scanning and I did educate her on this and also I did write her for Flexeril 10 mg to try to help with the cramping she's getting nighttime

## 2017-07-19 ENCOUNTER — Other Ambulatory Visit: Payer: BLUE CROSS/BLUE SHIELD | Admitting: Orthotics

## 2017-07-21 ENCOUNTER — Ambulatory Visit (INDEPENDENT_AMBULATORY_CARE_PROVIDER_SITE_OTHER): Payer: BLUE CROSS/BLUE SHIELD | Admitting: Podiatry

## 2017-07-21 ENCOUNTER — Encounter: Payer: Self-pay | Admitting: Podiatry

## 2017-07-21 DIAGNOSIS — M779 Enthesopathy, unspecified: Secondary | ICD-10-CM

## 2017-07-21 DIAGNOSIS — M722 Plantar fascial fibromatosis: Secondary | ICD-10-CM

## 2017-07-21 MED ORDER — MELOXICAM 15 MG PO TABS: 15.0000 mg | ORAL_TABLET | Freq: Every day | ORAL | 2 refills | Status: DC

## 2017-07-21 MED ORDER — TRIAMCINOLONE ACETONIDE 10 MG/ML IJ SUSP
10.0000 mg | Freq: Once | INTRAMUSCULAR | Status: AC
  Administered 2017-07-21: 10 mg

## 2017-07-21 NOTE — Progress Notes (Signed)
Subjective:    Patient ID: Joann Page, female   DOB: 56 y.o.   MRN: [id number]   HPI patient states that she is having pain in the bottom of her heel still and she is concerned because it seems to change and she also still getting some pain in the forefoot bilateral and wants to know about shockwave or surgery    ROS      Objective:  Physical Exam neurovascular status intact with patient's plantar pain no longer in the medial band but more in the center and lateral band with inflammation fluid with mild discomfort in the second MPJ bilateral     Assessment:    Inflammatory capsulitis bilateral with central lateral band fasciitis right which is probably compensation for the medial pain     Plan:    Reviewed condition and at this time since the pain has changed to go ahead and do a lateral injection which was accomplished with Kenalog and Xylocaine and I then applied air fracture walker to completely immobilize the plantar heel and capsule. Placed on mobile big 15 mg daily patient be seen back in 3 weeks and may still require shockwave or open surgery

## 2017-07-28 DIAGNOSIS — N6459 Other signs and symptoms in breast: Secondary | ICD-10-CM | POA: Diagnosis not present

## 2017-07-28 DIAGNOSIS — Z853 Personal history of malignant neoplasm of breast: Secondary | ICD-10-CM | POA: Diagnosis not present

## 2017-07-28 DIAGNOSIS — N6324 Unspecified lump in the left breast, lower inner quadrant: Secondary | ICD-10-CM | POA: Diagnosis not present

## 2017-07-28 LAB — HM MAMMOGRAPHY

## 2017-07-29 ENCOUNTER — Encounter: Payer: Self-pay | Admitting: Internal Medicine

## 2017-07-29 NOTE — Progress Notes (Signed)
Abstracted and sent to scan  

## 2017-08-11 ENCOUNTER — Ambulatory Visit: Payer: BLUE CROSS/BLUE SHIELD | Admitting: Podiatry

## 2017-08-22 MED FILL — OMEPRAZOLE 20 MG CAP: 20 | 30 days supply | Qty: 30 | Fill #2

## 2017-08-22 MED FILL — TAMOXIFEN 20 MG TABLET: 20 | 30 days supply | Qty: 30 | Fill #2

## 2017-08-25 MED FILL — HYDROCODON-APAP 5-325: 5-325 | 30 days supply | Qty: 60 | Fill #0

## 2017-09-07 ENCOUNTER — Ambulatory Visit (INDEPENDENT_AMBULATORY_CARE_PROVIDER_SITE_OTHER): Payer: BLUE CROSS/BLUE SHIELD

## 2017-09-07 DIAGNOSIS — Z23 Encounter for immunization: Secondary | ICD-10-CM

## 2017-09-21 MED FILL — OMEPRAZOLE 20 MG CAP: 20 | 30 days supply | Qty: 30 | Fill #3

## 2017-09-21 MED FILL — TAMOXIFEN CITRATE 20 MG TAB: 20 | 30 days supply | Qty: 30 | Fill #3

## 2017-09-22 MED FILL — HYDROCODON-APAP 5-325: 5-325 | 30 days supply | Qty: 60 | Fill #0

## 2017-10-17 DIAGNOSIS — M722 Plantar fascial fibromatosis: Secondary | ICD-10-CM | POA: Diagnosis not present

## 2017-10-17 DIAGNOSIS — M79671 Pain in right foot: Secondary | ICD-10-CM | POA: Diagnosis not present

## 2017-10-18 DIAGNOSIS — M545 Low back pain: Secondary | ICD-10-CM | POA: Diagnosis not present

## 2017-10-18 MED FILL — TAMOXIFEN CITRATE 20 MG TAB: 20 | 30 days supply | Qty: 30 | Fill #4

## 2017-10-21 MED FILL — HYDROCODON-APAP 5-325: 5-325 | 30 days supply | Qty: 60 | Fill #0

## 2017-11-01 ENCOUNTER — Telehealth: Payer: Self-pay | Admitting: Internal Medicine

## 2017-11-01 NOTE — Telephone Encounter (Signed)
Copied from [place] [id number]. Topic: Inquiry >> Oct 31, 2017  3:29 PM Moton, Claiborne [place], [place] wrote: Reason for CRM: Patient received a paper in the mail from her podiatrist saying that she needs to have blood work done before her pre-op appointment which is Wed. If someone could please give her a call back about this.

## 2017-11-02 DIAGNOSIS — M79671 Pain in right foot: Secondary | ICD-10-CM | POA: Diagnosis not present

## 2017-11-02 DIAGNOSIS — M722 Plantar fascial fibromatosis: Secondary | ICD-10-CM | POA: Diagnosis not present

## 2017-11-03 ENCOUNTER — Encounter: Payer: Self-pay | Admitting: Internal Medicine

## 2017-11-03 ENCOUNTER — Ambulatory Visit (INDEPENDENT_AMBULATORY_CARE_PROVIDER_SITE_OTHER): Payer: BLUE CROSS/BLUE SHIELD | Admitting: Internal Medicine

## 2017-11-03 DIAGNOSIS — I10 Essential (primary) hypertension: Secondary | ICD-10-CM | POA: Diagnosis not present

## 2017-11-03 MED ORDER — AMLODIPINE BESYLATE 10 MG PO TABS: 10.0000 mg | ORAL_TABLET | Freq: Every day | ORAL | 3 refills | Status: DC

## 2017-11-03 MED FILL — AMLODIPINE BESYLATE 10 MG T: 10 | 30 days supply | Qty: 30 | Fill #0

## 2017-11-03 NOTE — Patient Instructions (Signed)
We have sent in the amlodipine to take 1 pill daily. We will try this for 3 months and then bring you back to look at the pressure.

## 2017-11-03 NOTE — Progress Notes (Signed)
   Subjective:    Patient ID: Joann Page, female    DOB: May 21, 1961, 56 y.o.   MRN: [id number]  HPI The patient is a 56 YO female coming in for high blood pressure at home. Typically 140s/80s but last 2 weeks has been much higher. Denies knowing the reason. Denies change in diet but not exercising due to foot pain. Denies new medications although NSAID added by podiatry in August. She has extensive family history of high blood pressure. Denies headaches, chest pains, dizziness.   Review of Systems  Constitutional: Negative.   HENT: Negative.   Eyes: Negative.   Respiratory: Negative for cough, chest tightness and shortness of breath.   Cardiovascular: Negative for chest pain, palpitations and leg swelling.  Gastrointestinal: Negative for abdominal distention, abdominal pain, constipation, diarrhea, nausea and vomiting.  Musculoskeletal: Negative.   Skin: Negative.   Neurological: Negative.   Psychiatric/Behavioral: Negative.       Objective:   Physical Exam  Constitutional: She is oriented to person, place, and time. She appears well-developed and well-nourished.  HENT:  Head: Normocephalic and atraumatic.  Eyes: EOM are normal.  Neck: Normal range of motion.  Cardiovascular: Normal rate and regular rhythm.  Pulmonary/Chest: Effort normal and breath sounds normal. No respiratory distress. She has no wheezes. She has no rales.  Abdominal: Soft.  Musculoskeletal: She exhibits no edema.  Neurological: She is alert and oriented to person, place, and time. Coordination normal.  Skin: Skin is warm and dry.   Vitals:   11/03/17 1030  BP: (!) 142/100  Pulse: 78  Temp: 97.8 F (36.6 C)  TempSrc: Oral  SpO2: 98%  Weight: 212 lb (96.2 kg)  Height: 5\' 6"  (1.676 m)      Assessment & Plan:

## 2017-11-03 NOTE — Assessment & Plan Note (Signed)
Rx for amlodipine 10 mg daily for elevated BP with family history. Advised to stop meloxicam although she is not taking regularly.

## 2017-11-15 ENCOUNTER — Telehealth: Payer: Self-pay | Admitting: Internal Medicine

## 2017-11-15 NOTE — Telephone Encounter (Signed)
LVM giving response and to make an appointment

## 2017-11-15 NOTE — Telephone Encounter (Signed)
Copied from [place]. Topic: Quick Communication - See Telephone Encounter >> Nov 15, 2017 10:43 AM Percell Belt A wrote: CRM for notification. See Telephone encounter for: pt called in said that she has been online reading about b12 injection.  She would like to know if Dr Sharlet Salina would ok for her to have these injections.  She has never talk to Dr Sharlet Salina about these in the past.  I told her that she will probably have to make an appt to talk to Dr Sharlet Salina.  She wanted me to send a message 1st   11/15/17.

## 2017-11-15 NOTE — Telephone Encounter (Signed)
Needs a visit, B12 shots are only appropriate if B12 level is low and hers has not been checked.

## 2017-11-15 NOTE — Telephone Encounter (Signed)
Can you please schedule patient for an office visit. She needs to have her B12 checked before consideration for B12 injections.

## 2017-11-16 ENCOUNTER — Other Ambulatory Visit: Payer: BLUE CROSS/BLUE SHIELD

## 2017-11-17 ENCOUNTER — Ambulatory Visit: Payer: BLUE CROSS/BLUE SHIELD | Admitting: Internal Medicine

## 2017-11-18 DIAGNOSIS — M545 Low back pain: Secondary | ICD-10-CM | POA: Diagnosis not present

## 2017-11-18 MED FILL — OMEPRAZOLE 20 MG CAP: 20 | 30 days supply | Qty: 30 | Fill #4

## 2017-11-18 MED FILL — TAMOXIFEN CITRATE 20 MG TAB: 20 | 30 days supply | Qty: 30 | Fill #5

## 2017-11-18 MED FILL — CYCLOBENZAPRINE 10 MG TAB: 10 | 20 days supply | Qty: 60 | Fill #0

## 2017-11-18 MED FILL — HYDROCODON-APAP 10-325: 10-325 | 30 days supply | Qty: 60 | Fill #0

## 2017-11-21 ENCOUNTER — Ambulatory Visit (INDEPENDENT_AMBULATORY_CARE_PROVIDER_SITE_OTHER): Payer: BLUE CROSS/BLUE SHIELD | Admitting: Internal Medicine

## 2017-11-21 ENCOUNTER — Encounter: Payer: Self-pay | Admitting: Internal Medicine

## 2017-11-21 ENCOUNTER — Other Ambulatory Visit (INDEPENDENT_AMBULATORY_CARE_PROVIDER_SITE_OTHER): Payer: BLUE CROSS/BLUE SHIELD

## 2017-11-21 VITALS — BP 130/88 | HR 80 | Temp 97.6°F | Ht 66.0 in | Wt 212.0 lb

## 2017-11-21 DIAGNOSIS — R5383 Other fatigue: Secondary | ICD-10-CM | POA: Diagnosis not present

## 2017-11-21 LAB — HEMOGLOBIN A1C: Hgb A1c MFr Bld: 5.6 % (ref 4.6–6.5)

## 2017-11-21 LAB — VITAMIN D 25 HYDROXY (VIT D DEFICIENCY, FRACTURES): VITD: 28.3 ng/mL — AB (ref 30.00–100.00)

## 2017-11-21 LAB — T4, FREE: Free T4: 0.75 ng/dL (ref 0.60–1.60)

## 2017-11-21 LAB — TSH: TSH: 1.66 u[IU]/mL (ref 0.35–4.50)

## 2017-11-21 LAB — VITAMIN B12: Vitamin B-12: 207 pg/mL — ABNORMAL LOW (ref 211–911)

## 2017-11-21 NOTE — Assessment & Plan Note (Signed)
Checking thyroid, B12, vitamin D today. Adjust as needed. Advised routine exercise to help with energy.

## 2017-11-21 NOTE — Progress Notes (Signed)
   Subjective:    Patient ID: Joann Page, female    DOB: 1961/04/26, 56 y.o.   MRN: [id number]  HPI The patient is a 56 YO female coming in for fatigue. She has been struggling with low energy. She denies blood in stool, lightheadedness. She denies weight loss. Denies depression or anxiety. She is wanting to get back into exercise and has not been able to start yet due to low energy. She is worried about low B12 levels. Overall stable for the last several months.   Review of Systems  Constitutional: Positive for activity change and fatigue. Negative for appetite change, chills, fever and unexpected weight change.  Respiratory: Negative for cough, chest tightness and shortness of breath.   Cardiovascular: Negative for chest pain, palpitations and leg swelling.  Gastrointestinal: Negative for abdominal distention, abdominal pain, constipation, diarrhea, nausea and vomiting.  Musculoskeletal: Negative.   Skin: Negative.   Neurological: Negative.       Objective:   Physical Exam  Constitutional: She is oriented to person, place, and time. She appears well-developed and well-nourished.  HENT:  Head: Normocephalic and atraumatic.  Eyes: EOM are normal.  Neck: Normal range of motion.  Cardiovascular: Normal rate and regular rhythm.  Pulmonary/Chest: Effort normal and breath sounds normal. No respiratory distress. She has no wheezes. She has no rales.  Abdominal: Soft. Bowel sounds are normal. She exhibits no distension. There is no tenderness. There is no rebound.  Musculoskeletal: She exhibits no edema.  Neurological: She is alert and oriented to person, place, and time. Coordination normal.  Skin: Skin is warm and dry.   Vitals:   11/21/17 1001  BP: 130/88  Pulse: 80  Temp: 97.6 F (36.4 C)  TempSrc: Oral  SpO2: 99%  Weight: 212 lb (96.2 kg)  Height: 5\' 6"  (1.676 m)      Assessment & Plan:

## 2017-11-21 NOTE — Patient Instructions (Signed)
We will check the labs today and call you back with the results.    

## 2017-11-24 ENCOUNTER — Telehealth: Payer: Self-pay

## 2017-11-24 ENCOUNTER — Ambulatory Visit (INDEPENDENT_AMBULATORY_CARE_PROVIDER_SITE_OTHER): Payer: BLUE CROSS/BLUE SHIELD | Admitting: *Deleted

## 2017-11-24 DIAGNOSIS — M79671 Pain in right foot: Secondary | ICD-10-CM | POA: Diagnosis not present

## 2017-11-24 DIAGNOSIS — E538 Deficiency of other specified B group vitamins: Secondary | ICD-10-CM | POA: Diagnosis not present

## 2017-11-24 DIAGNOSIS — M722 Plantar fascial fibromatosis: Secondary | ICD-10-CM | POA: Diagnosis not present

## 2017-11-24 DIAGNOSIS — M7751 Other enthesopathy of right foot: Secondary | ICD-10-CM | POA: Diagnosis not present

## 2017-11-24 MED ORDER — CYANOCOBALAMIN 1000 MCG/ML IJ SOLN
1000.0000 ug | Freq: Once | INTRAMUSCULAR | Status: AC
  Administered 2017-11-24: 1000 ug via INTRAMUSCULAR

## 2017-11-24 NOTE — Telephone Encounter (Signed)
Routing to dr crawford ---patient forgot to mention leg cramps (esp at night) at her recent visit with you---she is wanting to know if she could come in for blood work to check magnesium level---please advise if you are ok with her doing this, I will call patient back, thanks

## 2017-11-24 NOTE — Telephone Encounter (Signed)
Patient advised of dr crawfords instructions, she will try magnesium to see if it helps, if not better, will call back

## 2017-11-24 NOTE — Progress Notes (Signed)
Medical treatment/procedure(s) were performed by non-physician practitioner and as supervising physician I was immediately available for consultation/collaboration. I agree with above. Elizabeth A Crawford, MD  

## 2017-11-24 NOTE — Telephone Encounter (Signed)
Would recommend that she can take otc magnesium to see if this helps the muscle cramps.

## 2017-12-01 ENCOUNTER — Ambulatory Visit (INDEPENDENT_AMBULATORY_CARE_PROVIDER_SITE_OTHER): Payer: BLUE CROSS/BLUE SHIELD | Admitting: *Deleted

## 2017-12-01 ENCOUNTER — Telehealth: Payer: Self-pay | Admitting: Internal Medicine

## 2017-12-01 DIAGNOSIS — E538 Deficiency of other specified B group vitamins: Secondary | ICD-10-CM | POA: Diagnosis not present

## 2017-12-01 MED ORDER — CYANOCOBALAMIN 1000 MCG/ML IJ SOLN
1000.0000 ug | Freq: Once | INTRAMUSCULAR | Status: AC
  Administered 2017-12-01: 1000 ug via INTRAMUSCULAR

## 2017-12-01 MED FILL — AMLODIPINE BESYLATE 10 MG T: 10 | 30 days supply | Qty: 30 | Fill #1

## 2017-12-01 NOTE — Telephone Encounter (Signed)
Pt. Was contacted to inquire if she received the previous message re: her Amlodipine, and insurance only covering a 30 day supply.  The pt. confirmed she got the message.  Verb. frustration in receiving numerous phone calls on the same subject.  Apologized to pt., and advised we just wanted to assure she rec'd the communication.

## 2017-12-01 NOTE — Telephone Encounter (Signed)
Copied from [place] [id number]. Topic: Quick Communication - See Telephone Encounter >> Dec 01, 2017 10:45 AM Aurelio Brash B wrote: CRM for notification. See Telephone encounter for:  Refill amLODipine (NORVASC) 10 MG tablet  [place] outpt phar 12/01/17.

## 2017-12-01 NOTE — Telephone Encounter (Signed)
Left message for pt Amlodipine was refilled 11/03/17 90# 3 refills per orders. Per brian at [facility] her insurance will only cover 30 day refills.  OV 11/03/17

## 2017-12-08 ENCOUNTER — Ambulatory Visit (INDEPENDENT_AMBULATORY_CARE_PROVIDER_SITE_OTHER): Payer: BLUE CROSS/BLUE SHIELD | Admitting: *Deleted

## 2017-12-08 ENCOUNTER — Telehealth: Payer: Self-pay | Admitting: *Deleted

## 2017-12-08 DIAGNOSIS — E538 Deficiency of other specified B group vitamins: Secondary | ICD-10-CM | POA: Diagnosis not present

## 2017-12-08 MED ORDER — CYANOCOBALAMIN 1000 MCG/ML IJ SOLN
1000.0000 ug | Freq: Once | INTRAMUSCULAR | Status: AC
  Administered 2017-12-08: 1000 ug via INTRAMUSCULAR

## 2017-12-08 NOTE — Telephone Encounter (Signed)
Pt came in for 3rd B12 injection. She states she have one more schedule for next week and then she is suppose to start taking ovc B12. She is wanting to know what mg should should take, and also when should she start taking them.Marland KitchenJohny Chess

## 2017-12-08 NOTE — Telephone Encounter (Signed)
Can take 1000 mcg daily B12 over the counter and can start the day after getting next shot.

## 2017-12-08 NOTE — Progress Notes (Signed)
Medical treatment/procedure(s) were performed by non-physician practitioner and as supervising physician I was immediately available for consultation/collaboration. I agree with above. Elizabeth A Crawford, MD  

## 2017-12-09 MED ORDER — VITAMIN B-12 1000 MCG PO TABS: 1000.0000 ug | ORAL_TABLET | Freq: Every day | ORAL | 0 refills | Status: DC

## 2017-12-09 NOTE — Telephone Encounter (Signed)
Notified pt w/Md response. Added otc b12 to med list../lmb

## 2017-12-15 ENCOUNTER — Ambulatory Visit: Payer: BLUE CROSS/BLUE SHIELD

## 2017-12-16 ENCOUNTER — Ambulatory Visit (INDEPENDENT_AMBULATORY_CARE_PROVIDER_SITE_OTHER): Payer: BLUE CROSS/BLUE SHIELD

## 2017-12-16 DIAGNOSIS — E538 Deficiency of other specified B group vitamins: Secondary | ICD-10-CM

## 2017-12-16 MED ORDER — CYANOCOBALAMIN 1000 MCG/ML IJ SOLN
1000.0000 ug | Freq: Once | INTRAMUSCULAR | Status: AC
  Administered 2017-12-16: 1000 ug via INTRAMUSCULAR

## 2017-12-16 MED FILL — HYDROCODON-APAP 10-325: 10-325 | 30 days supply | Qty: 60 | Fill #0

## 2017-12-16 NOTE — Progress Notes (Signed)
Medical treatment/procedure(s) were performed by non-physician practitioner and as supervising physician I was immediately available for consultation/collaboration. I agree with above. Elizabeth A Crawford, MD  

## 2017-12-20 MED FILL — TAMOXIFEN CITRATE 20 MG TAB: 20 | 30 days supply | Qty: 30 | Fill #6

## 2017-12-20 MED FILL — OMEPRAZOLE 20 MG CAP: 20 | 30 days supply | Qty: 30 | Fill #5

## 2017-12-28 MED FILL — AMLODIPINE BESYLATE 10 MG T: 10 | 30 days supply | Qty: 30 | Fill #2

## 2018-01-16 MED FILL — TAMOXIFEN CITRATE 20 MG TAB: 20 | 30 days supply | Qty: 30 | Fill #7

## 2018-01-16 MED FILL — HYDROCODON-APAP 10-325: 10-325 | 30 days supply | Qty: 60 | Fill #0

## 2018-01-27 DIAGNOSIS — M545 Low back pain: Secondary | ICD-10-CM | POA: Diagnosis not present

## 2018-01-27 MED FILL — IBUPROFEN 800 MG TAB: 800 | 20 days supply | Qty: 60 | Fill #0

## 2018-01-30 ENCOUNTER — Telehealth: Payer: Self-pay | Admitting: Internal Medicine

## 2018-01-30 NOTE — Telephone Encounter (Signed)
Call pt no answer LMOM RTC.../lmb 

## 2018-01-30 NOTE — Telephone Encounter (Signed)
Is she getting worse symptoms? Having side effects with omeprazole? The carafate she is requesting is a different kind of medicine than omeprazole. May need visit.

## 2018-01-30 NOTE — Telephone Encounter (Signed)
Copied from [place]. Topic: Quick Communication - Rx Refill/Question >> Jan 30, 2018  9:09 AM Margot Ables wrote: Medication: omeprazole - pt is hoping to change the medication to Carafate - requesting call back Has the patient contacted their pharmacy? No. Preferred Pharmacy (with phone number or street name): [place], [place] - [address] [phone] (Phone) [phone] (Fax)

## 2018-01-31 NOTE — Telephone Encounter (Signed)
Called pt again she stated that she is coming in on 02/02/18 to discuss w/MD../lmb

## 2018-02-02 ENCOUNTER — Ambulatory Visit: Payer: BLUE CROSS/BLUE SHIELD | Admitting: Internal Medicine

## 2018-02-03 MED FILL — AMLODIPINE BESYLATE 10 MG T: 10 | 30 days supply | Qty: 30 | Fill #3

## 2018-02-14 MED FILL — TAMOXIFEN CITRATE 20 MG TAB: 20 | 30 days supply | Qty: 30 | Fill #8

## 2018-02-14 MED FILL — HYDROCODON-APAP 10-325: 10-325 | 30 days supply | Qty: 60 | Fill #0

## 2018-02-16 MED FILL — OMEPRAZOLE 20 MG CAP: 20 | 30 days supply | Qty: 30 | Fill #6

## 2018-02-17 DIAGNOSIS — M545 Low back pain: Secondary | ICD-10-CM | POA: Diagnosis not present

## 2018-03-13 MED FILL — IBUPROFEN 800 MG TAB: 800 | 20 days supply | Qty: 60 | Fill #1

## 2018-03-13 MED FILL — AMLODIPINE BESYLATE 10 MG T: 10 | 30 days supply | Qty: 30 | Fill #4

## 2018-03-13 MED FILL — TAMOXIFEN CITRATE 20 MG TAB: 20 | 30 days supply | Qty: 30 | Fill #9

## 2018-03-13 MED FILL — OMEPRAZOLE 20 MG CAP: 20 | 30 days supply | Qty: 30 | Fill #7

## 2018-03-14 MED FILL — HYDROCODON-APAP 10-325: 10-325 | 30 days supply | Qty: 60 | Fill #0

## 2018-03-14 MED FILL — CLINDAMYCIN HCL 300 MG CAPS: 300 | 9 days supply | Qty: 30 | Fill #0

## 2018-03-23 MED FILL — CLINDAMYCIN HCL 300 MG CAPS: 300 | 9 days supply | Qty: 30 | Fill #1

## 2018-04-10 MED FILL — AMLODIPINE BESYLATE 10 MG T: 10 | 30 days supply | Qty: 30 | Fill #5

## 2018-04-10 MED FILL — OMEPRAZOLE 20 MG CAP: 20 | 30 days supply | Qty: 30 | Fill #8

## 2018-04-10 MED FILL — TAMOXIFEN CITRATE 20 MG TAB: 20 | 30 days supply | Qty: 30 | Fill #10

## 2018-04-11 MED FILL — HYDROCODON-APAP 10-325: 10-325 | 30 days supply | Qty: 60 | Fill #0

## 2018-04-13 MED FILL — CHLORHEXIDINE 0.12% RINSE: 0.12 | 16 days supply | Qty: 473 | Fill #0

## 2018-04-13 MED FILL — DOXYCYCLINE HYCLATE 100 MG: 100 | 10 days supply | Qty: 10 | Fill #0

## 2018-04-28 DIAGNOSIS — K045 Chronic apical periodontitis: Secondary | ICD-10-CM | POA: Diagnosis not present

## 2018-05-08 MED FILL — TAMOXIFEN 20 MG TABLET: 20 | 30 days supply | Qty: 30 | Fill #11

## 2018-05-08 MED FILL — AMLODIPINE BESYLATE 10 MG T: 10 | 30 days supply | Qty: 30 | Fill #6

## 2018-05-15 ENCOUNTER — Other Ambulatory Visit: Payer: Self-pay | Admitting: Internal Medicine

## 2018-05-15 MED FILL — OMEPRAZOLE 20 MG CAP: 20 | 30 days supply | Qty: 30 | Fill #0

## 2018-05-16 ENCOUNTER — Telehealth: Payer: Self-pay | Admitting: Hematology and Oncology

## 2018-05-16 ENCOUNTER — Inpatient Hospital Stay: Payer: BLUE CROSS/BLUE SHIELD | Admitting: Hematology and Oncology

## 2018-05-16 NOTE — Telephone Encounter (Signed)
Patient called to r/s appointment. 

## 2018-05-22 ENCOUNTER — Other Ambulatory Visit (INDEPENDENT_AMBULATORY_CARE_PROVIDER_SITE_OTHER): Payer: BLUE CROSS/BLUE SHIELD

## 2018-05-22 ENCOUNTER — Ambulatory Visit: Payer: BLUE CROSS/BLUE SHIELD | Admitting: Internal Medicine

## 2018-05-22 ENCOUNTER — Encounter: Payer: Self-pay | Admitting: Internal Medicine

## 2018-05-22 VITALS — BP 122/90 | HR 81 | Temp 98.1°F | Ht 66.0 in | Wt 225.0 lb

## 2018-05-22 DIAGNOSIS — Z0000 Encounter for general adult medical examination without abnormal findings: Secondary | ICD-10-CM | POA: Diagnosis not present

## 2018-05-22 DIAGNOSIS — I10 Essential (primary) hypertension: Secondary | ICD-10-CM

## 2018-05-22 LAB — LIPID PANEL
CHOL/HDL RATIO: 4
Cholesterol: 209 mg/dL — ABNORMAL HIGH (ref 0–200)
HDL: 52.4 mg/dL (ref >39.00)
NonHDL: 157.07
Triglycerides: 265 mg/dL — ABNORMAL HIGH (ref 0.0–149.0)
VLDL: 53 mg/dL — AB (ref 0.0–40.0)

## 2018-05-22 LAB — CBC
HEMATOCRIT: 38.1 % (ref 36.0–46.0)
HEMOGLOBIN: 12.7 g/dL (ref 12.0–15.0)
MCHC: 33.5 g/dL (ref 30.0–36.0)
MCV: 86.3 fl (ref 78.0–100.0)
Platelets: 188 10*3/uL (ref 150.0–400.0)
RBC: 4.41 Mil/uL (ref 3.87–5.11)
RDW: 12.1 % (ref 11.5–15.5)
WBC: 6.8 10*3/uL (ref 4.0–10.5)

## 2018-05-22 LAB — COMPREHENSIVE METABOLIC PANEL
ALK PHOS: 41 U/L (ref 39–117)
ALT: 17 U/L (ref 0–35)
AST: 18 U/L (ref 0–37)
Albumin: 3.8 g/dL (ref 3.5–5.2)
BUN: 9 mg/dL (ref 6–23)
CHLORIDE: 106 meq/L (ref 96–112)
CO2: 26 meq/L (ref 19–32)
Calcium: 8.8 mg/dL (ref 8.4–10.5)
Creatinine, Ser: 0.71 mg/dL (ref 0.40–1.20)
GFR: 109.76 mL/min (ref >60.00)
GLUCOSE: 97 mg/dL (ref 70–99)
POTASSIUM: 4 meq/L (ref 3.5–5.1)
Sodium: 140 mEq/L (ref 135–145)
TOTAL PROTEIN: 6.5 g/dL (ref 6.0–8.3)
Total Bilirubin: 0.3 mg/dL (ref 0.2–1.2)

## 2018-05-22 LAB — LDL CHOLESTEROL, DIRECT: Direct LDL: 113 mg/dL

## 2018-05-22 LAB — MAGNESIUM: Magnesium: 1.9 mg/dL (ref 1.5–2.5)

## 2018-05-22 LAB — VITAMIN D 25 HYDROXY (VIT D DEFICIENCY, FRACTURES): VITD: 28.97 ng/mL — AB (ref 30.00–100.00)

## 2018-05-22 LAB — VITAMIN B12: Vitamin B-12: 373 pg/mL (ref 211–911)

## 2018-05-22 LAB — HEMOGLOBIN A1C: HEMOGLOBIN A1C: 5.6 % (ref 4.6–6.5)

## 2018-05-22 MED ORDER — HYDROCHLOROTHIAZIDE 12.5 MG PO CAPS: 12.5000 mg | ORAL_CAPSULE | Freq: Every day | ORAL | 11 refills | Status: DC

## 2018-05-22 MED FILL — HYDROCHLOROTHIAZIDE 12.5 MG: 12.5 | 30 days supply | Qty: 30 | Fill #0

## 2018-05-22 NOTE — Patient Instructions (Signed)
We have sent in the hctz to take 1 pill daily for blood pressure.   We are checking the labs today and will see you back anytime for the physical.

## 2018-05-22 NOTE — Progress Notes (Signed)
   Subjective:    Patient ID: Joann Page, female    DOB: Jan 10, 1961, 57 y.o.   MRN: [id number]  HPI The patient is a 57 YO female coming in for follow up of her blood pressure. She started taking amlodipine about 6 months ago. She did not come back for follow up as recommended. In the last several weeks she started getting swelling in her ankles and feet. She stopped taking amlodipine about 3 days ago. The swelling did resolve within 1 day. She denies headaches, chest pains, abdominal pain. She has not checked her blood pressure at home.   Review of Systems  Constitutional: Negative.   HENT: Negative.   Eyes: Negative.   Respiratory: Negative for cough, chest tightness and shortness of breath.   Cardiovascular: Positive for leg swelling. Negative for chest pain and palpitations.  Gastrointestinal: Negative for abdominal distention, abdominal pain, constipation, diarrhea, nausea and vomiting.  Musculoskeletal: Negative.   Skin: Negative.   Neurological: Negative.   Psychiatric/Behavioral: Negative.       Objective:   Physical Exam  Constitutional: She is oriented to person, place, and time. She appears well-developed and well-nourished.  HENT:  Head: Normocephalic and atraumatic.  Eyes: EOM are normal.  Neck: Normal range of motion.  Cardiovascular: Normal rate and regular rhythm.  Pulmonary/Chest: Effort normal and breath sounds normal. No respiratory distress. She has no wheezes. She has no rales.  Abdominal: Soft. Bowel sounds are normal. She exhibits no distension. There is no tenderness. There is no rebound.  Musculoskeletal: She exhibits no edema.  Neurological: She is alert and oriented to person, place, and time. Coordination normal.  Skin: Skin is warm and dry.   Vitals:   05/22/18 1054  BP: 122/90  Pulse: 81  Temp: 98.1 F (36.7 C)  TempSrc: Oral  SpO2: 99%  Weight: 225 lb (102.1 kg)  Height: 5\' 6"  (1.676 m)      Assessment & Plan:

## 2018-05-22 NOTE — Assessment & Plan Note (Signed)
Will stop amlodipine and start hctz 12.5 mg daily. She is above goal of 130/80 at this time and has continued weight gain since last visit which has been her trigger for high BP. Does not have a way to check pressure at home. Checking CMP and adjust as needed.

## 2018-05-23 ENCOUNTER — Telehealth: Payer: Self-pay | Admitting: Hematology and Oncology

## 2018-05-23 ENCOUNTER — Inpatient Hospital Stay: Payer: BLUE CROSS/BLUE SHIELD | Attending: Hematology and Oncology | Admitting: Hematology and Oncology

## 2018-05-23 DIAGNOSIS — Z9221 Personal history of antineoplastic chemotherapy: Secondary | ICD-10-CM | POA: Diagnosis not present

## 2018-05-23 DIAGNOSIS — Z923 Personal history of irradiation: Secondary | ICD-10-CM | POA: Insufficient documentation

## 2018-05-23 DIAGNOSIS — Z79810 Long term (current) use of selective estrogen receptor modulators (SERMs): Secondary | ICD-10-CM | POA: Diagnosis not present

## 2018-05-23 DIAGNOSIS — Z170 Estrogen receptor positive status [ER+]: Secondary | ICD-10-CM | POA: Insufficient documentation

## 2018-05-23 DIAGNOSIS — Z90710 Acquired absence of both cervix and uterus: Secondary | ICD-10-CM | POA: Insufficient documentation

## 2018-05-23 DIAGNOSIS — C50512 Malignant neoplasm of lower-outer quadrant of left female breast: Secondary | ICD-10-CM | POA: Diagnosis not present

## 2018-05-23 MED ORDER — TAMOXIFEN CITRATE 20 MG PO TABS: 20.0000 mg | ORAL_TABLET | Freq: Every day | ORAL | 3 refills | Status: DC

## 2018-05-23 NOTE — Telephone Encounter (Signed)
Spoke with patient re annual f/u June 2020.

## 2018-05-23 NOTE — Assessment & Plan Note (Signed)
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 She does have hot flashes intermittently.  Breast Cancer Surveillance: 1. Breast exam 05/23/2018: Normal 2. Mamm[facility]done at [facility] 07/28/2017  Return to clinic in 1 year for follow-up

## 2018-05-23 NOTE — Progress Notes (Signed)
Patient Care Team: Hoyt Koch, MD as PCP - General (Internal Medicine) Berle Mull, MD as Consulting Physician (Family Medicine) Lafayette Dragon, MD (Inactive) as Consulting Physician (Gastroenterology) Himmelrich, Bryson Ha, RD (Inactive) as Dietitian  DIAGNOSIS:  Encounter Diagnosis  Name Primary?  . Malignant neoplasm of lower-outer quadrant of left breast of female, estrogen receptor positive ([place])     SUMMARY OF ONCOLOGIC HISTORY:   Breast cancer of lower-outer quadrant of left female breast ([place])   07/03/2012 Initial Diagnosis    Left: IDC, MRI 2.3 cm mass      07/17/2012 Surgery    Left Lumpectomy: 1.7 cm, IDC, High grade, 4 SN neg,  with HG DCIS with necrosis, Oncotype Dx Intermed grade      07/31/2012 - 11/23/2012 Chemotherapy    Adjuvant chemo TC x 4      11/30/2012 - 01/12/2013 Radiation Therapy    Adjuvant XRT      01/18/2013 -  Anti-estrogen oral therapy    Adjuvant  tamoxifen 10 years       CHIEF COMPLIANT: Follow-up on tamoxifen therapy  INTERVAL HISTORY: Joann Page is a 57 year old with above-mentioned history of left breast cancer treated with surgery followed by chemo and radiation and is currently on tamoxifen.  She has been on it for the past for years.  She appears to be tolerating it fairly well.  She does not have any muscle aches or pains.  She does have hot flashes.  She denies any lumps or nodules in the breast.  REVIEW OF SYSTEMS:   Constitutional: Denies fevers, chills or abnormal weight loss Eyes: Denies blurriness of vision Ears, nose, mouth, throat, and face: Denies mucositis or sore throat Respiratory: Denies cough, dyspnea or wheezes Cardiovascular: Denies palpitation, chest discomfort Gastrointestinal:  Denies nausea, heartburn or change in bowel habits Skin: Denies abnormal skin rashes Lymphatics: Denies new lymphadenopathy or easy bruising Neurological:Denies numbness, tingling or new weaknesses Behavioral/Psych: Mood is  stable, no new changes  Extremities: No lower extremity edema Breast:  denies any pain or lumps or nodules in either breasts All other systems were reviewed with the patient and are negative.  I have reviewed the past medical history, past surgical history, social history and family history with the patient and they are unchanged from previous note.  ALLERGIES:  is allergic to sulfa antibiotics.  MEDICATIONS:  Current Outpatient Medications  Medication Sig Dispense Refill  . cyclobenzaprine (FLEXERIL) 10 MG tablet Take 1 tablet (10 mg total) by mouth 3 (three) times daily as needed for muscle spasms. 30 tablet 0  . hydrochlorothiazide (MICROZIDE) 12.5 MG capsule Take 1 capsule (12.5 mg total) by mouth daily. 30 capsule 11  . HYDROcodone-acetaminophen (NORCO/VICODIN) 5-325 MG tablet as needed.  0  . meloxicam (MOBIC) 15 MG tablet Take 1 tablet (15 mg total) by mouth daily. 30 tablet 2  . omeprazole (PRILOSEC) 20 MG capsule Take 1 capsule (20 mg total) by mouth as needed. Need annual appointment for further refills 90 capsule 0  . tamoxifen (NOLVADEX) 20 MG tablet Take 1 tablet (20 mg total) by mouth daily. 90 tablet 3  . vitamin B-12 (CYANOCOBALAMIN) 1000 MCG tablet Take 1 tablet (1,000 mcg total) by mouth daily. 30 tablet 0   No current facility-administered medications for this visit.     PHYSICAL EXAMINATION: ECOG PERFORMANCE STATUS: 1 - Symptomatic but completely ambulatory  Vitals:   05/23/18 1532  BP: (!) 146/93  Pulse: 88  Resp: 19  Temp: 98.5 F (  36.9 C)  SpO2: 100%   Filed Weights   05/23/18 1532  Weight: 228 lb 1.6 oz (103.5 kg)    GENERAL:alert, no distress and comfortable SKIN: skin color, texture, turgor are normal, no rashes or significant lesions EYES: normal, Conjunctiva are pink and non-injected, sclera clear OROPHARYNX:no exudate, no erythema and lips, buccal mucosa, and tongue normal  NECK: supple, thyroid normal size, non-tender, without  nodularity LYMPH:  no palpable lymphadenopathy in the cervical, axillary or inguinal LUNGS: clear to auscultation and percussion with normal breathing effort HEART: regular rate & rhythm and no murmurs and no lower extremity edema ABDOMEN:abdomen soft, non-tender and normal bowel sounds MUSCULOSKELETAL:no cyanosis of digits and no clubbing  NEURO: alert & oriented x 3 with fluent speech, no focal motor/sensory deficits EXTREMITIES: No lower extremity edema BREAST: No palpable masses or nodules in either right or left breasts. No palpable axillary supraclavicular or infraclavicular adenopathy no breast tenderness or nipple discharge. (exam performed in the presence of a chaperone)  LABORATORY DATA:  I have reviewed the data as listed CMP Latest Ref Rng & Units 05/22/2018 04/22/2017 05/11/2016  Glucose 70 - 99 mg/dL 97 92 89  BUN 6 - 23 mg/dL 9 10 9.6  Creatinine 0.40 - 1.20 mg/dL 0.71 0.74 0.8  Sodium 135 - 145 mEq/L 140 141 141  Potassium 3.5 - 5.1 mEq/L 4.0 4.6 4.5  Chloride 96 - 112 mEq/L 106 105 -  CO2 19 - 32 mEq/L 26 29 28   Calcium 8.4 - 10.5 mg/dL 8.8 9.4 9.5  Total Protein 6.0 - 8.3 g/dL 6.5 6.8 6.9  Total Bilirubin 0.2 - 1.2 mg/dL 0.3 0.3 <0.30  Alkaline Phos 39 - 117 U/L 41 40 45  AST 0 - 37 U/L 18 17 25   ALT 0 - 35 U/L 17 17 28     Lab Results  Component Value Date   WBC 6.8 05/22/2018   HGB 12.7 05/22/2018   HCT 38.1 05/22/2018   MCV 86.3 05/22/2018   PLT 188.0 05/22/2018   NEUTROABS 2.7 05/11/2016    ASSESSMENT & PLAN:  Breast cancer of lower-outer quadrant of left female breast [facility]) September 2013 Oncotype DX intermediate risk status post Taxotere Cytoxan x4 cycles followed by radiation therapy and adjuvant tamoxifen started 01/18/2013  Tamoxifen toxicity evaluation: Patient had a previous hysterectomy and does not need Pap smears. hot flashes mostly resolved.  Breast Cancer Surveillance: 1. Breast exam: Annually 2. Mammogramdone at [facility] 07/28/2017  Return  to clinic in 1 year for follow-up  No orders of the defined types were placed in this encounter.  The patient has a good understanding of the overall plan. she agrees with it. she will call with any problems that may develop before the next visit here.   Harriette Ohara, MD 05/23/18

## 2018-05-25 ENCOUNTER — Other Ambulatory Visit: Payer: Self-pay | Admitting: Sports Medicine

## 2018-05-25 DIAGNOSIS — M545 Low back pain: Secondary | ICD-10-CM | POA: Diagnosis not present

## 2018-05-27 ENCOUNTER — Encounter (HOSPITAL_COMMUNITY): Payer: Self-pay | Admitting: Psychiatry

## 2018-05-27 ENCOUNTER — Other Ambulatory Visit: Payer: Self-pay

## 2018-05-27 ENCOUNTER — Ambulatory Visit (INDEPENDENT_AMBULATORY_CARE_PROVIDER_SITE_OTHER): Payer: BLUE CROSS/BLUE SHIELD | Admitting: Psychiatry

## 2018-05-27 ENCOUNTER — Ambulatory Visit (HOSPITAL_COMMUNITY): Payer: BLUE CROSS/BLUE SHIELD | Admitting: Psychiatry

## 2018-05-27 VITALS — BP 152/84 | HR 87 | Temp 97.7°F | Wt 224.8 lb

## 2018-05-27 DIAGNOSIS — R232 Flushing: Secondary | ICD-10-CM | POA: Diagnosis not present

## 2018-05-27 DIAGNOSIS — F5104 Psychophysiologic insomnia: Secondary | ICD-10-CM | POA: Diagnosis not present

## 2018-05-27 DIAGNOSIS — F332 Major depressive disorder, recurrent severe without psychotic features: Secondary | ICD-10-CM | POA: Diagnosis not present

## 2018-05-27 MED ORDER — VENLAFAXINE HCL ER 37.5 MG PO CP24: 37.5000 mg | ORAL_CAPSULE | Freq: Every day | ORAL | 2 refills | Status: DC

## 2018-05-27 NOTE — Progress Notes (Signed)
Psychiatric Initial Adult Assessment   Patient Identification: Joann Page MRN:  [id number] Date of Evaluation:  05/27/2018 Referral Source: provider, pcp Chief Complaint:   Chief Complaint    Establish Care; Anxiety; Depression    depression Visit Diagnosis:    ICD-10-CM   1. Severe episode of recurrent major depressive disorder, without psychotic features (HCC) F33.2 venlafaxine XR (EFFEXOR XR) 37.5 MG 24 hr capsule  2. Hot flashes R23.2   3. Psychophysiological insomnia F51.04     History of Present Illness:  Joann Page is a 57 year old female with no prior psychiatric treatment history.  She is currently undergoing ongoing treatment for breast cancer, treated with tamoxifen.  She also struggles with chronic back pain, high blood pressure, obesity, chronic fatigue.  She has a strong family history of major depressive disorder but reports that she has never received treatment for her mood symptoms.  She reports that over the past 4-6 months she is gradually become more depressed, worries about her health, worries about her cancer and her back pain.  Reports that the hot flashes are quite awful for her.  She is started to withdrawal, does not participate in family activities, tends to spend most of her day sleeping or laying around the house.  Reports that she and her husband have separated and this is also been a very stressful transition for her over the past year.  Reports that her daughter recently revealed that she is gay, and this is been a difficult thing for mom to process.  She has had thoughts about death and dying and wonders if life is even worth living.  She denies any intentions or plans, and reports that it has not gotten that far.  She is tearful throughout our encounter, feeling like she wants to get back to being herself and she feels ashamed of her weight gain, and her pain and her depression.  I spent time with the patient educating her about the diagnosis of major  depressive disorder and treatments, including the integral nature of individual therapy.  Given the severity of her symptoms, I suggested she may benefit from participation in the IOP and she was receptive to this.  She reports that she does not try to burden her family with her depression and grief, and she feels like she needs an unbiased to "third party" to talk to and to be able to share her sadness and grief.  Her daughter is in college and she does not want to worry her daughter so she does not tend to tell her daughter about her depression either.  I educated the patient about Effexor, and some of the common side effects and intolerance.  I suggested that this may be a good modality of treatment for her depression given the nerve attenuation effects, in addition to the benefits in reducing the symptoms associated with hot flashes.  She was receptive to initiating Effexor, and participating in the IOP.  Again, I reviewed safety issues with her and she denies any intention to harm herself and just wants to feel better and is hopeful that these interventions may provide her some benefit.  Associated Signs/Symptoms: Depression Symptoms:  depressed mood, insomnia, psychomotor retardation, fatigue, feelings of worthlessness/guilt, difficulty concentrating, recurrent thoughts of death, anxiety, (Hypo) Manic Symptoms:  none Anxiety Symptoms:  Excessive Worry, Psychotic Symptoms:  None PTSD Symptoms: Negative  Past Psychiatric History: No prior psychiatric treatment  Previous Psychotropic Medications: No   Substance Abuse History in the last 12 months:  No.  Consequences of Substance Abuse: Negative  Past Medical History:  Past Medical History:  Diagnosis Date  . 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 Breast cancer ([place]) 07/17/12   at age 46, ER/PR +, hER 2 -  . Cancer ([place]) 07/07/12   Left Breast Inv Ductal Ca.  . Degenerative disk disease 2010   Lumbar spine  . GERD (gastroesophageal  reflux disease)    hx  . H/O hiatal hernia    small  . Hyperlipidemia   . Knee pain, right   . No pertinent past medical history   . Obesity   . S/P radiation therapy 11/30/12 - 01/12/13   Left Breast / 50 gray in 25 Fractions with boost 10 gray in 5 Fractions  . Status post chemotherapy comp 10/27/12    4 CyclesTaxotere/ Cytoxan  . Status post chemotherapy Comp. 01/12/13   neoadjuvant chemotherapy: 4 Cycles of Taxotere/Cytoxan  . Use of tamoxifen (Nolvadex) 01/18/13    Past Surgical History:  Procedure Laterality Date  . ABDOMINAL HYSTERECTOMY  04/2000   partial, fibroids  . 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  Marland Kitchen BREATH TEK H PYLORI  12/07/2011   Procedure: BREATH TEK H PYLORI;  Surgeon: Shann Medal, MD;  Location: [facility];  Service: Endoscopy;  Laterality: N/A;  . COLONOSCOPY    . Needle Core Biopsy  06/30/12   Left Breast: 12 O'clock/ Benign Parenchyma    Family Psychiatric History: reviewed as below   Family History:  Family History  Problem Relation Age of Onset  . Colon cancer Father 60  . Alcohol abuse Father   . Colon polyps Brother   . Anxiety disorder Brother   . Depression Brother   . Breast cancer Sister 39  . Obesity Sister   . Anxiety disorder Sister   . Depression Sister   . Anxiety disorder Mother   . Depression Mother   . Lymphoma Brother   . Esophageal cancer Neg Hx   . Stomach cancer Neg Hx   . Rectal cancer Neg Hx     Social History:   Social History   Socioeconomic History  . Marital status: Legally Separated    Spouse name: Not on file  . Number of children: 1  . Years of education: Not on file  . Highest education level: Associate degree: occupational, Hotel manager, or vocational program  Occupational  History  . Occupation: Pharmacologist    CommentAstronomer  Social Needs  . Financial resource strain: Hard  . Food insecurity:    Worry: Often true    Inability: Often true  . Transportation needs:    Medical: No    Non-medical: No  Tobacco Use  . Smoking status: Never Smoker  . Smokeless tobacco: Never Used  Substance and Sexual Activity  . Alcohol use: No    Comment: menses age 64, no HRT or birth control pills   . Drug use: No  . Sexual activity: Yes  Lifestyle  . Physical activity:    Days per week: 0 days    Minutes per session: 0 min  . Stress: Rather much  Relationships  . Social connections:    Talks on phone: Never    Gets together: Never    Attends religious service:  Never    Active member of club or organization: No    Attends meetings of clubs or organizations: Never    Relationship status: Separated  Other Topics Concern  . Not on file  Social History Narrative   Married for 20 years, b=currently separated   41 year old child          Additional Social History: She is single, lives alone in [place], has a good relationship with her sister who lives next door.  She has a good relationship with her mother who lives nearby.  She has a good relationship with her daughter who is in college  Allergies:   Allergies  Allergen Reactions  . Sulfa Antibiotics Shortness Of Breath and Swelling    Metabolic Disorder Labs: Lab Results  Component Value Date   HGBA1C 5.6 05/22/2018   No results found for: PROLACTIN Lab Results  Component Value Date   CHOL 209 (H) 05/22/2018   TRIG 265.0 (H) 05/22/2018   HDL 52.40 05/22/2018   CHOLHDL 4 05/22/2018   VLDL 53.0 (H) 05/22/2018   LDLCALC 90 01/01/2016     Current Medications: Current Outpatient Medications  Medication Sig Dispense Refill  . cyclobenzaprine (FLEXERIL) 10 MG tablet Take 1 tablet (10 mg total) by mouth 3 (three) times daily as needed for muscle spasms. 30 tablet 0  . hydrochlorothiazide  (MICROZIDE) 12.5 MG capsule Take 1 capsule (12.5 mg total) by mouth daily. 30 capsule 11  . HYDROcodone-acetaminophen (NORCO/VICODIN) 5-325 MG tablet as needed.  0  . meloxicam (MOBIC) 15 MG tablet Take 1 tablet (15 mg total) by mouth daily. 30 tablet 2  . omeprazole (PRILOSEC) 20 MG capsule Take 1 capsule (20 mg total) by mouth as needed. Need annual appointment for further refills 90 capsule 0  . tamoxifen (NOLVADEX) 20 MG tablet Take 1 tablet (20 mg total) by mouth daily. 90 tablet 3  . vitamin B-12 (CYANOCOBALAMIN) 1000 MCG tablet Take 1 tablet (1,000 mcg total) by mouth daily. 30 tablet 0  . venlafaxine XR (EFFEXOR XR) 37.5 MG 24 hr capsule Take 1 capsule (37.5 mg total) by mouth daily. 30 capsule 2   No current facility-administered medications for this visit.     Neurologic: Headache: Negative Seizure: Negative Paresthesias:Yes  Musculoskeletal: Strength & Muscle Tone: Decreased, walks with a cane Gait & Station: normal Patient leans: Using a cane  Psychiatric Specialty Exam: Review of Systems  Constitutional: Negative.   HENT: Negative.   Respiratory: Negative.   Cardiovascular: Negative.   Gastrointestinal: Negative.   Musculoskeletal: Positive for back pain and myalgias.  Neurological: Positive for tingling and sensory change.  Psychiatric/Behavioral: Positive for depression. Negative for hallucinations, memory loss and substance abuse. The patient is nervous/anxious and has insomnia.        Passive thoughts about death and dying, no intent/plan of suicide     Blood pressure (!) 152/84, pulse 87, temperature 97.7 F (36.5 C), temperature source Oral, weight 224 lb 12.8 oz (102 kg).Body mass index is 36.28 kg/m.  General Appearance: Casual and Fairly Groomed  Eye Contact:  Fair  Speech:  Normal Rate  Volume:  Decreased  Mood:  Depressed and Dysphoric  Affect:  Depressed and Tearful  Thought Process:  Coherent and Descriptions of Associations: Intact  Orientation:   Full (Time, Place, and Person)  Thought Content:  Logical  Suicidal Thoughts:  Yes.  without intent/plan  Homicidal Thoughts:  No  Memory:  Immediate;   Fair  Judgement:  Fair  Insight:  Fair  Psychomotor Activity:  Normal  Concentration:  Concentration: Good  Recall:  Good  Fund of Knowledge:Good  Language: Good  Akathisia:  Negative  Handed:  Left  AIMS (if indicated):  na  Assets:  Communication Skills Desire for Improvement Housing Transportation  ADL's:  Intact  Cognition: WNL  Sleep: Poor sleep secondary to chronic pain    Treatment Plan Summary: Joann Page is a 57 year old female with a medical history of breast cancer, currently undergoing treatment with tamoxifen.  She will require treatment with tamoxifen for 10-year period of time, she is currently 5 years into treatment.  She struggles with depression, fatigue, tearfulness, anhedonia, hopelessness and passive suicidal thoughts.  She has had these symptoms for at least 6 months and has experienced prior episodes of depression but not as worse as this.  She has no intentions to end her life or plan of how she might harm herself, but does appear quite severely depressed and tearful.  I suggested she would benefit from participation in the IOP and she was on board with starting this program 5 days a week.    We agreed to initiate venlafaxine extended release 37.5 mg, and if she does not have any increase in her blood pressure, we can increase to 75 mg extended release.  Her blood pressure at the visit today was elevated but part of that is likely attributed to her being distressed and tearful.  I am hopeful that an optimal dose of Effexor will help with her hot flashes and chronic pain in addition to depression.  1. Severe episode of recurrent major depressive disorder, without psychotic features ([place])   2. Hot flashes   3. Psychophysiological insomnia     Status of current problems: New to [employer] Ordered: No  orders of the defined types were placed in this encounter.   Labs Reviewed: NA  Collateral Obtained/Records Reviewed: NA  Plan:  Initiate venlafaxine extended release 37.5 mg daily Reviewed risks and benefits of SNRI Return to clinic in 6 weeks Referral for IOP  Aundra Dubin, MD 6/29/20192:40 PM

## 2018-05-29 MED FILL — VENLAFAXINE HCL ER 37.5 MG: 37.5 | 30 days supply | Qty: 30 | Fill #0

## 2018-05-30 ENCOUNTER — Telehealth (HOSPITAL_COMMUNITY): Payer: Self-pay | Admitting: Psychiatry

## 2018-05-30 NOTE — Telephone Encounter (Signed)
D:  Dr. Daron Offer referred pt to [facility].  A:  Called pt to give her a start date for IOP.  Pt states she will call her insurance co first before signing up.  C/O financial strain.  Gave pt info about [facility] also.  R:  Pt receptive.

## 2018-06-05 ENCOUNTER — Ambulatory Visit
Admission: RE | Admit: 2018-06-05 | Discharge: 2018-06-05 | Disposition: A | Discharge status: Home / Self Care | Payer: BLUE CROSS/BLUE SHIELD | Source: Ambulatory Visit | Attending: Sports Medicine | Admitting: Sports Medicine

## 2018-06-05 DIAGNOSIS — M545 Low back pain: Secondary | ICD-10-CM

## 2018-06-05 MED FILL — HYDROCODON-APAP 10-325: 10-325 | 30 days supply | Qty: 60 | Fill #0

## 2018-06-15 MED FILL — OMEPRAZOLE 20 MG CAP: 20 | 30 days supply | Qty: 30 | Fill #1

## 2018-06-15 MED FILL — TAMOXIFEN CITRATE 20 MG TAB: 20 | 30 days supply | Qty: 30 | Fill #0

## 2018-06-15 MED FILL — HYDROCHLOROTHIAZIDE 12.5 MG: 12.5 | 30 days supply | Qty: 30 | Fill #1

## 2018-06-28 ENCOUNTER — Encounter: Payer: Self-pay | Admitting: Podiatry

## 2018-06-28 NOTE — Progress Notes (Signed)
Medical Records requested by [facility] were e-mailed to them at their request to the e-mail address of [email or web address].

## 2018-07-03 DIAGNOSIS — M545 Low back pain: Secondary | ICD-10-CM | POA: Diagnosis not present

## 2018-07-03 MED FILL — HYDROCODON-APAP 10-325: 10-325 | 30 days supply | Qty: 60 | Fill #0

## 2018-07-06 ENCOUNTER — Encounter (HOSPITAL_COMMUNITY): Payer: Self-pay | Admitting: Psychiatry

## 2018-07-06 ENCOUNTER — Ambulatory Visit (INDEPENDENT_AMBULATORY_CARE_PROVIDER_SITE_OTHER): Payer: BLUE CROSS/BLUE SHIELD | Admitting: Psychiatry

## 2018-07-06 DIAGNOSIS — F332 Major depressive disorder, recurrent severe without psychotic features: Secondary | ICD-10-CM

## 2018-07-06 MED ORDER — HYDROXYZINE PAMOATE 25 MG PO CAPS: 25.0000 mg | ORAL_CAPSULE | Freq: Three times a day (TID) | ORAL | 0 refills | Status: DC | PRN

## 2018-07-06 MED ORDER — VENLAFAXINE HCL ER 150 MG PO CP24: 150.0000 mg | ORAL_CAPSULE | Freq: Every day | ORAL | 0 refills | Status: DC

## 2018-07-06 MED ORDER — VENLAFAXINE HCL ER 75 MG PO CP24: 75.0000 mg | ORAL_CAPSULE | Freq: Every day | ORAL | 0 refills | Status: DC

## 2018-07-06 MED FILL — VENLAFAXINE HCL ER 75 MG CA: 75 | 30 days supply | Qty: 30 | Fill #0

## 2018-07-06 NOTE — Progress Notes (Signed)
[facility] MD/PA/NP OP Progress Note  07/06/2018 2:29 PM Joann Page  MRN:  [id number]  Chief Complaint: med check HPI: Joann Page presents for med management follow-up.  She was supposed to go up on Effexor to 2 capsules but forgot to do this.  She reports that she has not had any side effects from Effexor to report.  She has continued to struggle with anxiety and had an episode of panic when she was supposed to get an MRI recently.  Spent time discussing some strategies to help with anxiety including therapy and Vistaril.  She reports that she does not have the suicidal thoughts anymore and that is a positive change.  We agreed to titrate Effexor and arrange for follow-up transition of care for her in this office.  After we spoke for a few minutes, the patient suddenly said, "I am done talking now, I do not want to talk anymore."  We were able to schedule her visits for follow-up and clarify the instructions for medications.  She continues to have somewhat of an odd presentation, continues to wear sunglasses in the office, and carries a shower curtain rod has a walking cane, and has generally poor eye contact.  Visit Diagnosis:    ICD-10-CM   1. Severe episode of recurrent major depressive disorder, without psychotic features (HCC) F33.2 venlafaxine XR (EFFEXOR-XR) 75 MG 24 hr capsule    venlafaxine XR (EFFEXOR XR) 150 MG 24 hr capsule    hydrOXYzine (VISTARIL) 25 MG capsule    Past Psychiatric History: See intake H&P for full details. Reviewed, with no updates at this time.  Past Medical History:  Past Medical History:  Diagnosis Date  . 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 Breast cancer ([place]) 07/17/12   at age 36, ER/PR +, hER 2 -  . Cancer ([place]) 07/07/12   Left Breast Inv Ductal Ca.  . Degenerative disk disease 2010   Lumbar spine  . GERD (gastroesophageal reflux disease)    hx  . H/O hiatal hernia    small  . Hyperlipidemia   . Knee pain, right   . No pertinent past medical history    . Obesity   . S/P radiation therapy 11/30/12 - 01/12/13   Left Breast / 50 gray in 25 Fractions with boost 10 gray in 5 Fractions  . Status post chemotherapy comp 10/27/12    4 CyclesTaxotere/ Cytoxan  . Status post chemotherapy Comp. 01/12/13   neoadjuvant chemotherapy: 4 Cycles of Taxotere/Cytoxan  . Use of tamoxifen (Nolvadex) 01/18/13    Past Surgical History:  Procedure Laterality Date  . ABDOMINAL HYSTERECTOMY  04/2000   partial, fibroids  . 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  Marland Kitchen BREATH TEK H PYLORI  12/07/2011   Procedure: BREATH TEK H PYLORI;  Surgeon: Shann Medal, MD;  Location: [facility];  Service: Endoscopy;  Laterality: N/A;  . COLONOSCOPY    . Needle Core Biopsy  06/30/12   Left Breast: 12 O'clock/ Benign Parenchyma    Family Psychiatric History: See intake H&P for full details. Reviewed, with no updates at this time.   Family History:  Family History  Problem Relation Age of Onset  . Colon cancer Father 6  . Alcohol abuse Father   .  Colon polyps Brother   . Anxiety disorder Brother   . Depression Brother   . Breast cancer Sister 57  . Obesity Sister   . Anxiety disorder Sister   . Depression Sister   . Anxiety disorder Mother   . Depression Mother   . Lymphoma Brother   . Esophageal cancer Neg Hx   . Stomach cancer Neg Hx   . Rectal cancer Neg Hx     Social History:  Social History   Socioeconomic History  . Marital status: Legally Separated    Spouse name: Not on file  . Number of children: 1  . Years of education: Not on file  . Highest education level: Associate degree: occupational, Hotel manager, or vocational program  Occupational History  . Occupation: Pharmacologist    CommentAstronomer  Social Needs  .  Financial resource strain: Hard  . Food insecurity:    Worry: Often true    Inability: Often true  . Transportation needs:    Medical: No    Non-medical: No  Tobacco Use  . Smoking status: Never Smoker  . Smokeless tobacco: Never Used  Substance and Sexual Activity  . Alcohol use: No    Comment: menses age 23, no HRT or birth control pills   . Drug use: No  . Sexual activity: Yes  Lifestyle  . Physical activity:    Days per week: 0 days    Minutes per session: 0 min  . Stress: Rather much  Relationships  . Social connections:    Talks on phone: Never    Gets together: Never    Attends religious service: Never    Active member of club or organization: No    Attends meetings of clubs or organizations: Never    Relationship status: Separated  Other Topics Concern  . Not on file  Social History Narrative   Married for 20 years, b=currently separated   65 year old child          Allergies:  Allergies  Allergen Reactions  . Sulfa Antibiotics Shortness Of Breath and Swelling    Metabolic Disorder Labs: Lab Results  Component Value Date   HGBA1C 5.6 05/22/2018   No results found for: PROLACTIN Lab Results  Component Value Date   CHOL 209 (H) 05/22/2018   TRIG 265.0 (H) 05/22/2018   HDL 52.40 05/22/2018   CHOLHDL 4 05/22/2018   VLDL 53.0 (H) 05/22/2018   LDLCALC 90 01/01/2016   Lab Results  Component Value Date   TSH 1.66 11/21/2017    Therapeutic Level Labs: No results found for: LITHIUM No results found for: VALPROATE No components found for:  CBMZ  Current Medications: Current Outpatient Medications  Medication Sig Dispense Refill  . cyclobenzaprine (FLEXERIL) 10 MG tablet Take 1 tablet (10 mg total) by mouth 3 (three) times daily as needed for muscle spasms. 30 tablet 0  . hydrochlorothiazide (MICROZIDE) 12.5 MG capsule Take 1 capsule (12.5 mg total) by mouth daily. 30 capsule 11  . HYDROcodone-acetaminophen (NORCO/VICODIN) 5-325 MG tablet as  needed.  0  . hydrOXYzine (VISTARIL) 25 MG capsule Take 1 capsule (25 mg total) by mouth 3 (three) times daily as needed for anxiety. 90 capsule 0  . meloxicam (MOBIC) 15 MG tablet Take 1 tablet (15 mg total) by mouth daily. 30 tablet 2  . omeprazole (PRILOSEC) 20 MG capsule Take 1 capsule (20 mg total) by mouth as needed. Need annual appointment for further refills 90 capsule 0  . tamoxifen (NOLVADEX) 20  MG tablet Take 1 tablet (20 mg total) by mouth daily. 90 tablet 3  . [START ON 08/03/2018] venlafaxine XR (EFFEXOR XR) 150 MG 24 hr capsule Take 1 capsule (150 mg total) by mouth daily with breakfast. 90 capsule 0  . venlafaxine XR (EFFEXOR-XR) 75 MG 24 hr capsule Take 1 capsule (75 mg total) by mouth daily with breakfast. 30 capsule 0  . vitamin B-12 (CYANOCOBALAMIN) 1000 MCG tablet Take 1 tablet (1,000 mcg total) by mouth daily. 30 tablet 0   No current facility-administered medications for this visit.      Musculoskeletal: Strength & Muscle Tone: within normal limits Gait & Station: normal Patient leans: N/A  Psychiatric Specialty Exam: ROS  There were no vitals taken for this visit.There is no height or weight on file to calculate BMI.  General Appearance: Casual and Fairly Groomed  Eye Contact:  Good  Speech:  Normal Rate  Volume:  Decreased  Mood:  Depressed and Dysphoric  Affect:  Appropriate and Congruent  Thought Process:  Goal Directed and Descriptions of Associations: Intact  Orientation:  Full (Time, Place, and Person)  Thought Content: Logical   Suicidal Thoughts:  No  Homicidal Thoughts:  No  Memory:  Immediate;   Good  Judgement:  Good  Insight:  Good  Psychomotor Activity:  Normal  Concentration:  Concentration: Good  Recall:  Good  Fund of Knowledge: Good  Language: Good  Akathisia:  Negative  Handed:  Right  AIMS (if indicated): not done  Assets:  Housing Microbiologist Vocational/Educational  ADL's:  Intact  Cognition: WNL  Sleep:   Fair   Screenings: GAD-7     Office Visit from 04/22/2017 in [place]  Total GAD-7 Score  5    PHQ2-9     Office Visit from 04/22/2017 in [facility]  PHQ-2 Total Score  1  PHQ-9 Total Score  6      Assessment and Plan:  ARTHEA NOBEL presents with a reduction in her suicidality and good tolerance of Effexor.  She has yet to increase to 75 mg so we agreed on a plan as below to titrate Effexor.  She is aware that writer is transitioning out of office as we discussed at the last visit and we have scheduled to follow up transition of care for her with Dr. Casimiro Needle and individual therapy with Leanne.  1. Severe episode of recurrent major depressive disorder, without psychotic features ([place])     Status of current problems: gradually improving  Labs Ordered: No orders of the defined types were placed in this encounter.   Labs Reviewed: n/a  Collateral Obtained/Records Reviewed: n/a  Plan:  Increase Effexor to 75 mg XR for 1 month, then increase to 150 mg XR daily Vistaril 25 mg TID prn for anxiety, sleep RTC 10-12 weeks follow up with Dr. Casimiro Needle Therapy referral Joslyn Devon in 3-4 weeks  Aundra Dubin, MD 07/06/2018, 2:29 PM

## 2018-07-08 ENCOUNTER — Inpatient Hospital Stay
Admission: RE | Admit: 2018-07-08 | Discharge: 2018-07-08 | Disposition: A | Discharge status: Home / Self Care | Payer: Self-pay | Source: Ambulatory Visit | Attending: Sports Medicine | Admitting: Sports Medicine

## 2018-07-14 ENCOUNTER — Ambulatory Visit
Admission: RE | Admit: 2018-07-14 | Discharge: 2018-07-14 | Disposition: A | Discharge status: Home / Self Care | Payer: BLUE CROSS/BLUE SHIELD | Source: Ambulatory Visit | Attending: Sports Medicine | Admitting: Sports Medicine

## 2018-07-14 DIAGNOSIS — M48061 Spinal stenosis, lumbar region without neurogenic claudication: Secondary | ICD-10-CM | POA: Diagnosis not present

## 2018-07-17 MED FILL — HYDROCHLOROTHIAZIDE 12.5 MG: 12.5 | 30 days supply | Qty: 30 | Fill #2

## 2018-07-17 MED FILL — TAMOXIFEN CITRATE 20 MG TAB: 20 | 30 days supply | Qty: 30 | Fill #1

## 2018-08-01 MED FILL — HYDROCODON-APAP 10-325: 10-325 | 30 days supply | Qty: 60 | Fill #0

## 2018-08-08 DIAGNOSIS — Z853 Personal history of malignant neoplasm of breast: Secondary | ICD-10-CM | POA: Diagnosis not present

## 2018-08-08 LAB — HM MAMMOGRAPHY

## 2018-08-09 ENCOUNTER — Encounter: Payer: Self-pay | Admitting: Internal Medicine

## 2018-08-09 ENCOUNTER — Ambulatory Visit (INDEPENDENT_AMBULATORY_CARE_PROVIDER_SITE_OTHER): Payer: BLUE CROSS/BLUE SHIELD | Admitting: Psychology

## 2018-08-09 ENCOUNTER — Encounter (HOSPITAL_COMMUNITY): Payer: Self-pay | Admitting: Psychology

## 2018-08-09 DIAGNOSIS — F331 Major depressive disorder, recurrent, moderate: Secondary | ICD-10-CM | POA: Diagnosis not present

## 2018-08-09 NOTE — Progress Notes (Signed)
Abstracted and sent to scan  
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
## 2018-08-14 MED FILL — TAMOXIFEN CITRATE 20 MG TAB: 20 | 30 days supply | Qty: 30 | Fill #2

## 2018-08-14 MED FILL — HYDROCHLOROTHIAZIDE 12.5 MG: 12.5 | 30 days supply | Qty: 30 | Fill #3

## 2018-08-15 DIAGNOSIS — M545 Low back pain: Secondary | ICD-10-CM | POA: Diagnosis not present

## 2018-08-29 MED FILL — HYDROCODON-APAP 10-325: 10-325 | 30 days supply | Qty: 60 | Fill #0

## 2018-09-01 MED FILL — OMEPRAZOLE 20 MG CPDR: 20 | 30 days supply | Qty: 30 | Fill #2

## 2018-09-11 ENCOUNTER — Ambulatory Visit (HOSPITAL_COMMUNITY): Payer: Self-pay | Admitting: Psychology

## 2018-09-18 MED FILL — TAMOXIFEN CITRATE 20 MG TAB: 20 | 30 days supply | Qty: 30 | Fill #3

## 2018-09-18 MED FILL — AMLODIPINE BESYLATE 10 MG T: 10 | 30 days supply | Qty: 30 | Fill #7

## 2018-09-18 MED FILL — HYDROCHLOROTHIAZIDE 12.5 MG: 12.5 | 30 days supply | Qty: 30 | Fill #4

## 2018-09-26 ENCOUNTER — Encounter (HOSPITAL_COMMUNITY): Payer: Self-pay | Admitting: Psychiatry

## 2018-09-26 ENCOUNTER — Ambulatory Visit (INDEPENDENT_AMBULATORY_CARE_PROVIDER_SITE_OTHER): Payer: BLUE CROSS/BLUE SHIELD | Admitting: Psychiatry

## 2018-09-26 VITALS — BP 122/78 | Ht 66.0 in | Wt 187.0 lb

## 2018-09-26 DIAGNOSIS — F339 Major depressive disorder, recurrent, unspecified: Secondary | ICD-10-CM | POA: Diagnosis not present

## 2018-09-26 MED ORDER — SERTRALINE HCL 100 MG PO TABS: ORAL_TABLET | ORAL | 3 refills | Status: DC

## 2018-09-26 MED FILL — SERTRALINE HCL 100 MG TAB: 100 | 30 days supply | Qty: 30 | Fill #0

## 2018-09-26 NOTE — Progress Notes (Signed)
BH MD/PA/NP OP Progress Note  09/26/2018 2:44 PM LAEL WETHERBEE  MRN:  [id number]  Chief Complaint: med check HPI: [name] Visit Diagnosis: Major depression single episode No diagnosis found.   Today the patient is doing poorly.  She is been off her Effexor for about 3 weeks because she is been unable to afford it.  She did experience moderate nausea as a withdrawal phenomenon.  This patient has been under the care of our clinic for a number of months.  Her previous psychiatrist began her on Effexor which apparently did have some response.  He was treating major depression.  Patient lives alone.  She is separated from her husband.  She owns her own home.  The patient gets assistance for food stamps.  Patient has a daughter who is in college and doing well.  The patient admits to persistent daily depression that is been present for about 6 to 8 months.  Once started on Effexor she was somewhat improved but only to a certain degree.  Patient has a chronic problem with sleep.  When she was started on the Effexor she was eating excessively and now she is eating much less.  Fact her appetite is absent.  Her energy level is low.  The patient is able to think and concentrate without problems and she denies a sense of worthlessness.  She does admit to reduction in self-esteem.  The patient is not suicidal.  The patient has lost her ability to enjoy many things.  He is to socialize she use to read no longer does these things.  The patient denies the use of alcohol or drugs.  She denies any psychotic symptoms.  She denies ever having manic symptoms.  Previous to 8 months ago she denies ever having an episode of major depression.  She denies symptoms of generalized anxiety disorder panic disorder or obsessive-compulsive disorder.  She has never been in a psychiatric hospital before.  Before she came to this clinic 8 months ago she never received psychiatric care.  Her medical care is significant for the fact she  was recently diagnosed with a B12 deficiency and is getting replacement.  Patient has chronic pain from spinal stenosis.  She receives hydrocodone twice daily and has injections every month.  At this time it is clear the patient is not doing well.  She is not acutely suicidal.  She denies any neurological symptoms other than chronic pain from her spinal stenosis.  Patient denies chest pain or shortness of breath.  Past Psychiatric History: See intake H&P for full details. Reviewed, with no updates at this time.  Past Medical History:  Past Medical History:  Diagnosis Date  . 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 Breast cancer ([place]) 07/17/12   at age 74, ER/PR +, hER 2 -  . Cancer ([place]) 07/07/12   Left Breast Inv Ductal Ca.  . Degenerative disk disease 2010   Lumbar spine  . GERD (gastroesophageal reflux disease)    hx  . H/O hiatal hernia    small  . Hyperlipidemia   . Knee pain, right   . No pertinent past medical history   . Obesity   . S/P radiation therapy 11/30/12 - 01/12/13   Left Breast / 50 gray in 25 Fractions with boost 10 gray in 5 Fractions  . Status post chemotherapy comp 10/27/12    4 CyclesTaxotere/ Cytoxan  . Status post chemotherapy Comp. 01/12/13   neoadjuvant chemotherapy: 4 Cycles of Taxotere/Cytoxan  .  Use of tamoxifen (Nolvadex) 01/18/13    Past Surgical History:  Procedure Laterality Date  . ABDOMINAL HYSTERECTOMY  04/2000   partial, fibroids  . 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  Marland Kitchen BREATH TEK H PYLORI  12/07/2011   Procedure: BREATH TEK H PYLORI;  Surgeon: Shann Medal, MD;  Location: [facility];  Service: Endoscopy;  Laterality: N/A;  . COLONOSCOPY    . Needle Core Biopsy  06/30/12   Left Breast: 12 O'clock/ Benign Parenchyma     Family Psychiatric History: See intake H&P for full details. Reviewed, with no updates at this time.   Family History:  Family History  Problem Relation Age of Onset  . Colon cancer Father 37  . Alcohol abuse Father   . Colon polyps Brother   . Anxiety disorder Brother   . Depression Brother   . Breast cancer Sister 64  . Obesity Sister   . Anxiety disorder Sister   . Depression Sister   . Anxiety disorder Mother   . Depression Mother   . Bipolar disorder Mother   . Lymphoma Brother   . Drug abuse Brother   . Anxiety disorder Sister   . Depression Sister   . Bipolar disorder Sister   . Esophageal cancer Neg Hx   . Stomach cancer Neg Hx   . Rectal cancer Neg Hx     Social History:  Social History   Socioeconomic History  . Marital status: Divorced    Spouse name: Not on file  . Number of children: 1  . Years of education: Not on file  . Highest education level: Associate degree: occupational, Hotel manager, or vocational program  Occupational History  . Occupation: Pharmacologist    CommentAstronomer  Social Needs  . Financial resource strain: Hard  . Food insecurity:    Worry: Often true    Inability: Often true  . Transportation needs:    Medical: No    Non-medical: No  Tobacco Use  . Smoking status: Never Smoker  . Smokeless tobacco: Never Used  Substance and Sexual Activity  . Alcohol use: No  . Drug use: No  . Sexual activity: Yes  Lifestyle  . Physical activity:    Days per week: 0 days    Minutes per session: 0 min  . Stress: Rather much  Relationships  . Social connections:    Talks on phone: Never    Gets together: Never    Attends religious service: Never    Active member of club or organization: No    Attends meetings of clubs or organizations: Never    Relationship status: Separated  Other Topics Concern  . Not on file  Social History Narrative   Married for 20+ years, divorced Dec 2017   Living on own       Allergies:  Allergies   Allergen Reactions  . Sulfa Antibiotics Shortness Of Breath and Swelling    Metabolic Disorder Labs: Lab Results  Component Value Date   HGBA1C 5.6 05/22/2018   No results found for: PROLACTIN Lab Results  Component Value Date   CHOL 209 (H) 05/22/2018   TRIG 265.0 (H) 05/22/2018   HDL 52.40 05/22/2018   CHOLHDL 4 05/22/2018   VLDL 53.0 (H) 05/22/2018   LDLCALC 90  01/01/2016   Lab Results  Component Value Date   TSH 1.66 11/21/2017    Therapeutic Level Labs: No results found for: LITHIUM No results found for: VALPROATE No components found for:  CBMZ  Current Medications: Current Outpatient Medications  Medication Sig Dispense Refill  . cyclobenzaprine (FLEXERIL) 10 MG tablet Take 1 tablet (10 mg total) by mouth 3 (three) times daily as needed for muscle spasms. 30 tablet 0  . hydrochlorothiazide (MICROZIDE) 12.5 MG capsule Take 1 capsule (12.5 mg total) by mouth daily. 30 capsule 11  . HYDROcodone-acetaminophen (NORCO/VICODIN) 5-325 MG tablet as needed.  0  . meloxicam (MOBIC) 15 MG tablet Take 1 tablet (15 mg total) by mouth daily. 30 tablet 2  . omeprazole (PRILOSEC) 20 MG capsule Take 1 capsule (20 mg total) by mouth as needed. Need annual appointment for further refills 90 capsule 0  . tamoxifen (NOLVADEX) 20 MG tablet Take 1 tablet (20 mg total) by mouth daily. 90 tablet 3  . venlafaxine XR (EFFEXOR XR) 150 MG 24 hr capsule Take 1 capsule (150 mg total) by mouth daily with breakfast. 90 capsule 0  . vitamin B-12 (CYANOCOBALAMIN) 1000 MCG tablet Take 1 tablet (1,000 mcg total) by mouth daily. 30 tablet 0  . amLODipine (NORVASC) 10 MG tablet   3  . hydrOXYzine (VISTARIL) 25 MG capsule Take 1 capsule (25 mg total) by mouth 3 (three) times daily as needed for anxiety. (Patient not taking: Reported on 08/09/2018) 90 capsule 0  . sertraline (ZOLOFT) 100 MG tablet Half pill q am for 6 days then 1  qam 30 tablet 3   No current facility-administered medications for this  visit.      Musculoskeletal: Strength & Muscle Tone: within normal limits Gait & Station: normal Patient leans: N/A  Psychiatric Specialty Exam: ROS  Blood pressure 122/78, height 5\' 6"  (1.676 m), weight 187 lb (84.8 kg).Body mass index is 30.18 kg/m.  General Appearance: Casual and Fairly Groomed  Eye Contact:  Good  Speech:  Normal Rate  Volume:  Decreased  Mood:  Depressed and Dysphoric  Affect:  Appropriate and Congruent  Thought Process:  Goal Directed and Descriptions of Associations: Intact  Orientation:  Full (Time, Place, and Person)  Thought Content: Logical   Suicidal Thoughts:  No  Homicidal Thoughts:  No  Memory:  Immediate;   Good  Judgement:  Good  Insight:  Good  Psychomotor Activity:  Normal  Concentration:  Concentration: Good  Recall:  Good  Fund of Knowledge: Good  Language: Good  Akathisia:  Negative  Handed:  Right  AIMS (if indicated): not done  Assets:  Housing Microbiologist Vocational/Educational  ADL's:  Intact  Cognition: WNL  Sleep:  Fair   Screenings: GAD-7     Office Visit from 04/22/2017 in [place]  Total GAD-7 Score  5    PHQ2-9     Office Visit from 04/22/2017 in [facility]  PHQ-2 Total Score  1  PHQ-9 Total Score  6      Assessment and Plan:   Today this patient is diagnosed with major depression says that she is worse since being off the Effexor.  But even when she was taking 150 mg she had only a moderate improvement.  The patient has an inconsistent erratic issue around her finances and her health insurance.  She is concerned that she will be able to pay for her medication she will lose her coverage.  This  is certainly a problem with a Effexor because of significant withdrawal phenomenon.  Patient denies being on any other antidepressants.  At this time her first problem is clearly major depression.  At this time will officially discontinue her Effexor  and begin her on Zoloft 100 mg in the morning.  Patient will also get back with her therapist she has an appointment in December.  Hopefully she can be seen on a regular basis.  This patient she will be seen again in approximately 2 months.  Noticed she is not suicidal and she is functioning fairly well.  I would say her clinical depression status is now moderate level.  It is noted that her energy level is very low.  This is likely a combination of depression as well as chronic pain.  No diagnosis found.  Status of current problems: gradually improving  Labs Ordered: No orders of the defined types were placed in this encounter.   Labs Reviewed: n/a  Collateral Obtained/Records Reviewed: n/a  Plan:  Increase Effexor to 75 mg XR for 1 month, then increase to 150 mg XR daily Vistaril 25 mg TID prn for anxiety, sleep RTC 10-12 weeks follow up with Dr. Casimiro Needle Therapy referral Joslyn Devon in 3-4 weeks  Jerral Ralph, MD 09/26/2018, 2:44 PM

## 2018-09-28 MED FILL — HYDROCODON-APAP 10-325: 10-325 | 30 days supply | Qty: 60 | Fill #0

## 2018-10-05 ENCOUNTER — Other Ambulatory Visit: Payer: Self-pay | Admitting: Internal Medicine

## 2018-10-05 MED FILL — OMEPRAZOLE 20 MG CPDR: 20 | 30 days supply | Qty: 30 | Fill #0

## 2018-10-11 ENCOUNTER — Ambulatory Visit (HOSPITAL_COMMUNITY): Payer: Self-pay | Admitting: Psychiatry

## 2018-10-16 MED FILL — TAMOXIFEN CITRATE 20 MG TAB: 20 | 30 days supply | Qty: 30 | Fill #4

## 2018-10-16 MED FILL — HYDROCHLOROTHIAZIDE 12.5 MG: 12.5 | 30 days supply | Qty: 30 | Fill #5

## 2018-10-27 MED FILL — HYDROCODON-APAP 10-325: 10-325 | 30 days supply | Qty: 60 | Fill #0

## 2018-10-31 DIAGNOSIS — R319 Hematuria, unspecified: Secondary | ICD-10-CM | POA: Diagnosis not present

## 2018-10-31 DIAGNOSIS — Z6829 Body mass index (BMI) 29.0-29.9, adult: Secondary | ICD-10-CM | POA: Diagnosis not present

## 2018-10-31 DIAGNOSIS — R309 Painful micturition, unspecified: Secondary | ICD-10-CM | POA: Diagnosis not present

## 2018-11-01 ENCOUNTER — Ambulatory Visit (HOSPITAL_COMMUNITY): Payer: BLUE CROSS/BLUE SHIELD | Admitting: Psychology

## 2018-11-01 MED FILL — NITROFURANTOIN MONO-MCR 100: 100 | 30 days supply | Qty: 30 | Fill #0

## 2018-11-14 ENCOUNTER — Telehealth (HOSPITAL_COMMUNITY): Payer: Self-pay

## 2018-11-14 MED FILL — HYDROCHLOROTHIAZIDE 12.5 MG: 12.5 | 30 days supply | Qty: 30 | Fill #6

## 2018-11-14 MED FILL — TAMOXIFEN CITRATE 20 MG TAB: 20 | 30 days supply | Qty: 30 | Fill #5

## 2018-11-14 MED FILL — OMEPRAZOLE 20 MG CPDR: 20 | 30 days supply | Qty: 30 | Fill #1

## 2018-11-14 NOTE — Telephone Encounter (Signed)
Patients pharmacy is calling, they said there is an interaction with the Sertraline and the Tamoxifen the patient is taking. Please review and advise, thank you

## 2018-11-20 NOTE — Telephone Encounter (Signed)
Per Dr. Casimiro Needle, there is an interaction at high doses - not at the doses the patient is currently taking. I called and left a voicemail for the pharmacy letting them know

## 2018-11-24 MED FILL — HYDROCODON-APAP 10-325: 10-325 | 30 days supply | Qty: 60 | Fill #0

## 2018-12-08 ENCOUNTER — Ambulatory Visit (INDEPENDENT_AMBULATORY_CARE_PROVIDER_SITE_OTHER): Payer: BLUE CROSS/BLUE SHIELD | Admitting: Psychiatry

## 2018-12-08 ENCOUNTER — Encounter (HOSPITAL_COMMUNITY): Payer: Self-pay | Admitting: Psychiatry

## 2018-12-08 VITALS — BP 122/74 | Ht 65.0 in | Wt 179.0 lb

## 2018-12-08 DIAGNOSIS — F320 Major depressive disorder, single episode, mild: Secondary | ICD-10-CM | POA: Diagnosis not present

## 2018-12-08 NOTE — Progress Notes (Signed)
BH MD/PA/NP OP Progress Note  12/08/2018 11:10 AM Joann Page  MRN:  [id number]  Chief Complaint: med check HPI: Joann Page Visit Diagnosis: Major depression recurrent This patient is seen for clinical depression.  Unfortunately her care is been disrupted.  At this time she announces that she can no longer be seen here because she cannot pay the deductible.  Noted also is that when she went to the pharmacy in error they told her that Zoloft would get in the way of her tamoxifen.  We called the pharmacy in front of her and clarify that Zoloft would not have this problem.  I strongly recommended that she go back to taking her Zoloft.  The patient denies being persistently depressed but says she is sleeping tired all the time.  She is quite withdrawn.  She actually says he sleeps too much.  She is eating okay has good energy to some degree.  She has little contact with her family.  The patient is never engaged in therapy.  The patient apparently is not compliant with medication treatments which might be related also to issues of finances.  Once again she states she will not be returning to the setting.  Today we gave her referral to [facility] for continual care.  Today the patient denies being suicidal.  She understands the idea of going to a different provider.  Past Psychiatric History: See intake H&P for full details. Reviewed, with no updates at this time.  Past Medical History:  Past Medical History:  Diagnosis Date  . 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 Breast cancer ([place]) 07/17/12   at age 63, ER/PR +, hER 2 -  . Cancer ([place]) 07/07/12   Left Breast Inv Ductal Ca.  . Degenerative disk disease 2010   Lumbar spine  . GERD (gastroesophageal reflux disease)    hx  . H/O hiatal hernia    small  . Hyperlipidemia   . Knee pain, right   . No pertinent past medical history   . Obesity   . S/P radiation therapy 11/30/12 - 01/12/13   Left Breast / 50 gray in 25 Fractions with boost 10 gray in 5  Fractions  . Status post chemotherapy comp 10/27/12    4 CyclesTaxotere/ Cytoxan  . Status post chemotherapy Comp. 01/12/13   neoadjuvant chemotherapy: 4 Cycles of Taxotere/Cytoxan  . Use of tamoxifen (Nolvadex) 01/18/13    Past Surgical History:  Procedure Laterality Date  . ABDOMINAL HYSTERECTOMY  04/2000   partial, fibroids  . 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  Marland Kitchen BREATH TEK H PYLORI  12/07/2011   Procedure: BREATH TEK H PYLORI;  Surgeon: Shann Medal, MD;  Location: [facility];  Service: Endoscopy;  Laterality: N/A;  . COLONOSCOPY    . Needle Core Biopsy  06/30/12   Left Breast: 12 O'clock/ Benign Parenchyma    Family Psychiatric History: See intake H&P for full details. Reviewed, with no updates at this time.   Family History:  Family History  Problem Relation Age of Onset  . Colon cancer Father 26  . Alcohol abuse Father   . Colon polyps Brother   . Anxiety disorder Brother   . Depression Brother   . Breast cancer Sister 59  .  Obesity Sister   . Anxiety disorder Sister   . Depression Sister   . Anxiety disorder Mother   . Depression Mother   . Bipolar disorder Mother   . Lymphoma Brother   . Drug abuse Brother   . Anxiety disorder Sister   . Depression Sister   . Bipolar disorder Sister   . Esophageal cancer Neg Hx   . Stomach cancer Neg Hx   . Rectal cancer Neg Hx     Social History:  Social History   Socioeconomic History  . Marital status: Divorced    Spouse name: Not on file  . Number of children: 1  . Years of education: Not on file  . Highest education level: Associate degree: occupational, Hotel manager, or vocational program  Occupational History  . Occupation: Pharmacologist    CommentAstronomer  Social Needs  .  Financial resource strain: Hard  . Food insecurity:    Worry: Often true    Inability: Often true  . Transportation needs:    Medical: No    Non-medical: No  Tobacco Use  . Smoking status: Never Smoker  . Smokeless tobacco: Never Used  Substance and Sexual Activity  . Alcohol use: No  . Drug use: No  . Sexual activity: Yes  Lifestyle  . Physical activity:    Days per week: 0 days    Minutes per session: 0 min  . Stress: Rather much  Relationships  . Social connections:    Talks on phone: Never    Gets together: Never    Attends religious service: Never    Active member of club or organization: No    Attends meetings of clubs or organizations: Never    Relationship status: Separated  Other Topics Concern  . Not on file  Social History Narrative   Married for 20+ years, divorced Dec 2017   Living on own       Allergies:  Allergies  Allergen Reactions  . Sulfa Antibiotics Shortness Of Breath and Swelling    Metabolic Disorder Labs: Lab Results  Component Value Date   HGBA1C 5.6 05/22/2018   No results found for: PROLACTIN Lab Results  Component Value Date   CHOL 209 (H) 05/22/2018   TRIG 265.0 (H) 05/22/2018   HDL 52.40 05/22/2018   CHOLHDL 4 05/22/2018   VLDL 53.0 (H) 05/22/2018   LDLCALC 90 01/01/2016   Lab Results  Component Value Date   TSH 1.66 11/21/2017    Therapeutic Level Labs: No results found for: LITHIUM No results found for: VALPROATE No components found for:  CBMZ  Current Medications: Current Outpatient Medications  Medication Sig Dispense Refill  . cyclobenzaprine (FLEXERIL) 10 MG tablet Take 1 tablet (10 mg total) by mouth 3 (three) times daily as needed for muscle spasms. 30 tablet 0  . hydrochlorothiazide (MICROZIDE) 12.5 MG capsule Take 1 capsule (12.5 mg total) by mouth daily. 30 capsule 11  . HYDROcodone-acetaminophen (NORCO/VICODIN) 5-325 MG tablet as needed.  0  . omeprazole (PRILOSEC) 20 MG capsule Take 1 capsule (20 mg  total) by mouth daily. 90 capsule 1  . tamoxifen (NOLVADEX) 20 MG tablet Take 1 tablet (20 mg total) by mouth daily. 90 tablet 3  . vitamin B-12 (CYANOCOBALAMIN) 1000 MCG tablet Take 1 tablet (1,000 mcg total) by mouth daily. 30 tablet 0  . hydrOXYzine (VISTARIL) 25 MG capsule Take 1 capsule (25 mg total) by mouth 3 (three) times daily as needed for anxiety. (Patient not taking: Reported on 08/09/2018) 90  capsule 0  . meloxicam (MOBIC) 15 MG tablet Take 1 tablet (15 mg total) by mouth daily. (Patient not taking: Reported on 12/08/2018) 30 tablet 2  . sertraline (ZOLOFT) 100 MG tablet Half pill q am for 6 days then 1  qam (Patient not taking: Reported on 12/08/2018) 30 tablet 3  . venlafaxine XR (EFFEXOR XR) 150 MG 24 hr capsule Take 1 capsule (150 mg total) by mouth daily with breakfast. (Patient not taking: Reported on 12/08/2018) 90 capsule 0   No current facility-administered medications for this visit.      Musculoskeletal: Strength & Muscle Tone: within normal limits Gait & Station: normal Patient leans: N/A  Psychiatric Specialty Exam: ROS  Blood pressure 122/74, height 5\' 5"  (1.651 m), weight 179 lb (81.2 kg).Body mass index is 29.79 kg/m.  General Appearance: Casual and Fairly Groomed  Eye Contact:  Good  Speech:  Normal Rate  Volume:  Decreased  Mood:  Depressed and Dysphoric  Affect:  Appropriate and Congruent  Thought Process:  Goal Directed and Descriptions of Associations: Intact  Orientation:  Full (Time, Place, and Person)  Thought Content: Logical   Suicidal Thoughts:  No  Homicidal Thoughts:  No  Memory:  Immediate;   Good  Judgement:  Good  Insight:  Good  Psychomotor Activity:  Normal  Concentration:  Concentration: Good  Recall:  Good  Fund of Knowledge: Good  Language: Good  Akathisia:  Negative  Handed:  Right  AIMS (if indicated): not done  Assets:  Housing Microbiologist Vocational/Educational  ADL's:  Intact  Cognition: WNL  Sleep:   Fair   Screenings: GAD-7     Office Visit from 04/22/2017 in [place]  Total GAD-7 Score  5    PHQ2-9     Office Visit from 04/22/2017 in [facility]  PHQ-2 Total Score  1  PHQ-9 Total Score  6      Assessment and Plan:   Today the patient's problem is that of clinical depression.  She is having trouble getting her medicines and staying with him.  Today we recommended that she take her Zoloft and that she make another appointment to see me in 6 or 7 weeks.  If in fact she confirms that she cannot afford to come to the setting she will then call [facility].  She was given the telephone number to contact [facility].  The patient is not acutely suicidal.  She is not psychotic.  She is not drinking any alcohol or using any drugs as I can tell.  Her only problem is that of clinical depression.  I think her severity is moderate.  This was an abbreviated session.  We did get a consultation with the pharmacist about her antidepressant. No diagnosis found.  Status of current problems: gradually improving  Labs Ordered: No orders of the defined types were placed in this encounter.   Labs Reviewed: n/a  Collateral Obtained/Records Reviewed: n/a  Plan:  Increase Effexor to 75 mg XR for 1 month, then increase to 150 mg XR daily Vistaril 25 mg TID prn for anxiety, sleep RTC 10-12 weeks follow up with Dr. Casimiro Needle Therapy referral Joslyn Devon in 3-4 weeks  Jerral Ralph, MD 12/08/2018, 11:10 AM

## 2018-12-14 MED FILL — OMEPRAZOLE 20 MG CPDR: 20 | 30 days supply | Qty: 30 | Fill #2

## 2018-12-14 MED FILL — HYDROCHLOROTHIAZIDE 12.5 MG: 12.5 | 30 days supply | Qty: 30 | Fill #7

## 2018-12-14 MED FILL — TAMOXIFEN CITRATE 20 MG TAB: 20 | 30 days supply | Qty: 30 | Fill #6

## 2018-12-21 MED FILL — CYCLOBENZAPRINE HCL 10 MG T: 10 | 20 days supply | Qty: 60 | Fill #0

## 2018-12-22 MED FILL — SERTRALINE HCL 100 MG TAB: 100 | 30 days supply | Qty: 30 | Fill #1

## 2018-12-22 MED FILL — HYDROCODON-APAP 10-325: 10-325 | 30 days supply | Qty: 60 | Fill #0

## 2019-01-15 MED FILL — TAMOXIFEN CITRATE 20 MG TAB: 20 | 30 days supply | Qty: 30 | Fill #7

## 2019-01-15 MED FILL — HYDROCHLOROTHIAZIDE 12.5 MG: 12.5 | 30 days supply | Qty: 30 | Fill #8

## 2019-01-16 ENCOUNTER — Other Ambulatory Visit (HOSPITAL_COMMUNITY): Payer: Self-pay

## 2019-01-16 MED ORDER — SERTRALINE HCL 100 MG PO TABS: ORAL_TABLET | ORAL | 0 refills | Status: DC

## 2019-01-16 MED FILL — SERTRALINE HCL 100 MG TAB: 100 | 30 days supply | Qty: 27 | Fill #0

## 2019-01-19 MED FILL — HYDROCODON-APAP 10-325: 10-325 | 30 days supply | Qty: 60 | Fill #0

## 2019-02-14 ENCOUNTER — Telehealth (HOSPITAL_COMMUNITY): Payer: Self-pay | Admitting: Psychiatry

## 2019-02-14 ENCOUNTER — Encounter (HOSPITAL_COMMUNITY): Payer: Self-pay | Admitting: Psychiatry

## 2019-02-14 ENCOUNTER — Ambulatory Visit (INDEPENDENT_AMBULATORY_CARE_PROVIDER_SITE_OTHER): Payer: BLUE CROSS/BLUE SHIELD | Admitting: Psychiatry

## 2019-02-14 ENCOUNTER — Other Ambulatory Visit: Payer: Self-pay

## 2019-02-14 VITALS — BP 144/80 | HR 70 | Temp 97.9°F | Ht 67.0 in | Wt 179.0 lb

## 2019-02-14 DIAGNOSIS — Z814 Family history of other substance abuse and dependence: Secondary | ICD-10-CM

## 2019-02-14 DIAGNOSIS — F324 Major depressive disorder, single episode, in partial remission: Secondary | ICD-10-CM

## 2019-02-14 DIAGNOSIS — Z818 Family history of other mental and behavioral disorders: Secondary | ICD-10-CM

## 2019-02-14 DIAGNOSIS — Z811 Family history of alcohol abuse and dependence: Secondary | ICD-10-CM | POA: Diagnosis not present

## 2019-02-14 MED ORDER — SERTRALINE HCL 100 MG PO TABS: ORAL_TABLET | ORAL | 6 refills | Status: DC

## 2019-02-14 MED FILL — TAMOXIFEN CITRATE 20 MG TAB: 20 | 30 days supply | Qty: 30 | Fill #8

## 2019-02-14 MED FILL — HYDROCHLOROTHIAZIDE 12.5 MG: 12.5 | 30 days supply | Qty: 30 | Fill #9

## 2019-02-14 MED FILL — SERTRALINE HCL 100 MG TAB: 100 | 30 days supply | Qty: 30 | Fill #0

## 2019-02-14 NOTE — Progress Notes (Signed)
BH MD/PA/NP OP Progress Note  02/14/2019 2:15 PM AUDRYANNA ZURITA  MRN:  [id number]  Chief Complaint: med check HPI: [name] Visit Diagnosis: Major depression recurrent  Today the patient is seen once again for depression disorder.  She actually is improved now.  She was taken off Effexor and takes 100 mg of Zoloft regularly.  She is actually doing better in terms of her mood.  She actually is sleeping well and eats well.  She has been doing a lot of self-help books and efforts.  She is not in therapy.  Apparently for reasons that are not clear therapy fell through.  Patient has a number of medical problems.  She is chronic pain.  Is from spinal stenosis.  She is now made a decision to make contact with his surgeon to be evaluated.  She also has fairly well-controlled hypertension.  The patient's biggest complaint however is related to her daughter.  It is her only daughter who is come out and shared that she is gay.  This is very distressing to the patient.  The patient also separated from her husband.  This is been present for a long time and she does not expected to get back.  She says she is still welcome but has been unable to communicate with him and certainly does not trust him.  He has had multiple affairs.  The patient says she is thinking concentrating pretty well.  She is not suicidal.  Again she sleeps and eats fairly well.  Patient's had a history of breast cancer.  Presently she is in the middle of getting her B12 complaints.  Patient is not suicidal.  She drinks no alcohol uses no drugs.  Patient owns her own home.  Patient is on hydrocodone for her back pain.  She is financially supported by her sister.  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  She makes a small amount of months but it is helpful.  Overall on a number ways the patient seems to be doing better.   Past Psychiatric History: See intake H&P for full details. Reviewed, with no  updates at this time.  Past Medical History:  Past Medical History:  Diagnosis Date  . 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 Breast cancer ([place]) 07/17/12   at age 34, ER/PR +, hER 2 -  . Cancer ([place]) 07/07/12   Left Breast Inv Ductal Ca.  . Degenerative disk disease 2010   Lumbar spine  . GERD (gastroesophageal reflux disease)    hx  . H/O hiatal hernia    small  . Hyperlipidemia   . Knee pain, right   . No pertinent past medical history   . Obesity   . S/P radiation therapy 11/30/12 - 01/12/13   Left Breast / 50 gray in 25 Fractions with boost 10 gray in 5 Fractions  . Status post chemotherapy comp 10/27/12    4 CyclesTaxotere/ Cytoxan  . Status post chemotherapy Comp. 01/12/13   neoadjuvant chemotherapy: 4 Cycles of Taxotere/Cytoxan  . Use of tamoxifen (Nolvadex) 01/18/13    Past Surgical History:  Procedure Laterality Date  . ABDOMINAL HYSTERECTOMY  04/2000   partial, fibroids  . 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  Marland Kitchen  BREATH TEK H PYLORI  12/07/2011   Procedure: BREATH TEK H PYLORI;  Surgeon: Shann Medal, MD;  Location: [facility];  Service: Endoscopy;  Laterality: N/A;  . COLONOSCOPY    . Needle Core Biopsy  06/30/12   Left Breast: 12 O'clock/ Benign Parenchyma    Family Psychiatric History: See intake H&P for full details. Reviewed, with no updates at this time.   Family History:  Family History  Problem Relation Age of Onset  . Colon cancer Father 7  . Alcohol abuse Father   . Colon polyps Brother   . Anxiety disorder Brother   . Depression Brother   . Breast cancer Sister 6  . Obesity Sister   . Anxiety disorder Sister   . Depression Sister   . Anxiety disorder Mother   . Depression Mother   . Bipolar disorder Mother   .  Lymphoma Brother   . Drug abuse Brother   . Anxiety disorder Sister   . Depression Sister   . Bipolar disorder Sister   . Esophageal cancer Neg Hx   . Stomach cancer Neg Hx   . Rectal cancer Neg Hx     Social History:  Social History   Socioeconomic History  . Marital status: Divorced    Spouse name: Not on file  . Number of children: 1  . Years of education: Not on file  . Highest education level: Associate degree: occupational, Hotel manager, or vocational program  Occupational History  . Occupation: Pharmacologist    CommentAstronomer  Social Needs  . Financial resource strain: Hard  . Food insecurity:    Worry: Often true    Inability: Often true  . Transportation needs:    Medical: No    Non-medical: No  Tobacco Use  . Smoking status: Never Smoker  . Smokeless tobacco: Never Used  Substance and Sexual Activity  . Alcohol use: No  . Drug use: No  . Sexual activity: Yes  Lifestyle  . Physical activity:    Days per week: 0 days    Minutes per session: 0 min  . Stress: Rather much  Relationships  . Social connections:    Talks on phone: Never    Gets together: Never    Attends religious service: Never    Active member of club or organization: No    Attends meetings of clubs or organizations: Never    Relationship status: Separated  Other Topics Concern  . Not on file  Social History Narrative   Married for 20+ years, divorced Dec 2017   Living on own       Allergies:  Allergies  Allergen Reactions  . Sulfa Antibiotics Shortness Of Breath and Swelling    Metabolic Disorder Labs: Lab Results  Component Value Date   HGBA1C 5.6 05/22/2018   No results found for: PROLACTIN Lab Results  Component Value Date   CHOL 209 (H) 05/22/2018   TRIG 265.0 (H) 05/22/2018   HDL 52.40 05/22/2018   CHOLHDL 4 05/22/2018   VLDL 53.0 (H) 05/22/2018   LDLCALC 90 01/01/2016   Lab Results  Component Value Date   TSH 1.66 11/21/2017    Therapeutic Level Labs: No  results found for: LITHIUM No results found for: VALPROATE No components found for:  CBMZ  Current Medications: Current Outpatient Medications  Medication Sig Dispense Refill  . cyclobenzaprine (FLEXERIL) 10 MG tablet Take 1 tablet (10 mg total) by mouth 3 (three) times daily as needed for muscle spasms. 30 tablet 0  .  hydrochlorothiazide (MICROZIDE) 12.5 MG capsule Take 1 capsule (12.5 mg total) by mouth daily. 30 capsule 11  . HYDROcodone-acetaminophen (NORCO/VICODIN) 5-325 MG tablet as needed.  0  . hydrOXYzine (VISTARIL) 25 MG capsule Take 1 capsule (25 mg total) by mouth 3 (three) times daily as needed for anxiety. 90 capsule 0  . meloxicam (MOBIC) 15 MG tablet Take 1 tablet (15 mg total) by mouth daily. 30 tablet 2  . omeprazole (PRILOSEC) 20 MG capsule Take 1 capsule (20 mg total) by mouth daily. 90 capsule 1  . sertraline (ZOLOFT) 100 MG tablet 1  qam 30 tablet 6  . tamoxifen (NOLVADEX) 20 MG tablet Take 1 tablet (20 mg total) by mouth daily. 90 tablet 3  . venlafaxine XR (EFFEXOR XR) 150 MG 24 hr capsule Take 1 capsule (150 mg total) by mouth daily with breakfast. 90 capsule 0  . vitamin B-12 (CYANOCOBALAMIN) 1000 MCG tablet Take 1 tablet (1,000 mcg total) by mouth daily. 30 tablet 0   No current facility-administered medications for this visit.      Musculoskeletal: Strength & Muscle Tone: within normal limits Gait & Station: normal Patient leans: N/A  Psychiatric Specialty Exam: ROS  Blood pressure (!) 144/80, pulse 70, temperature 97.9 F (36.6 C), height 5\' 7"  (1.702 m), weight 179 lb (81.2 kg).Body mass index is 28.04 kg/m.  General Appearance: Casual and Fairly Groomed  Eye Contact:  Good  Speech:  Normal Rate  Volume:  Decreased  Mood:  Depressed and Dysphoric  Affect:  Appropriate and Congruent  Thought Process:  Goal Directed and Descriptions of Associations: Intact  Orientation:  Full (Time, Place, and Person)  Thought Content: Logical   Suicidal Thoughts:   No  Homicidal Thoughts:  No  Memory:  Immediate;   Good  Judgement:  Good  Insight:  Good  Psychomotor Activity:  Normal  Concentration:  Concentration: Good  Recall:  Good  Fund of Knowledge: Good  Language: Good  Akathisia:  Negative  Handed:  Right  AIMS (if indicated): not done  Assets:  Housing Microbiologist Vocational/Educational  ADL's:  Intact  Cognition: WNL  Sleep:  Fair   Screenings: GAD-7     Office Visit from 04/22/2017 in [place]  Total GAD-7 Score  5    PHQ2-9     Office Visit from 04/22/2017 in [place]  PHQ-2 Total Score  1  PHQ-9 Total Score  6      Assessment and Plan:   The patient's first problem is that of major clinical depression.  I do think she has had a response to Zoloft 100 mg.  Towards the end of the session despite the fact the patient is at least 50% improved she requested to be evaluated for IOP program.  Patient does not think she is well enough and she is just about ready to make some big decisions.  She wants some help dealing with her daughter.  She suspects talking about I will be the best intervention to do that difficulty getting her into one-to-one therapy she is requested to be in this outpatient program.  Therefore my interventions for treating her clinical depression should be Zoloft together with the IOP program.  Dealing with her chronic pain is also extremely important.  She is attempting to do this by reconnecting with a neurosurgeon.  This patient to return to see me in 3 months but we will make efforts to connect her with the IOP program.  Status of current problems: gradually improving  Labs Ordered: No orders of the defined types were placed in this encounter.   Labs Reviewed: n/a  Collateral Obtained/Records Reviewed: n/a  Plan:  Increase Effexor to 75 mg XR for 1 month, then increase to 150 mg XR daily Vistaril 25 mg TID prn for anxiety,  sleep RTC 10-12 weeks follow up with Dr. Casimiro Needle Therapy referral Joslyn Devon in 3-4 weeks  Jerral Ralph, MD 02/14/2019, 2:15 PM

## 2019-02-16 MED FILL — HYDROCODON-APAP 10-325: 10-325 | 30 days supply | Qty: 60 | Fill #0

## 2019-03-02 ENCOUNTER — Other Ambulatory Visit (HOSPITAL_COMMUNITY): Payer: Self-pay

## 2019-03-02 DIAGNOSIS — F332 Major depressive disorder, recurrent severe without psychotic features: Secondary | ICD-10-CM

## 2019-03-02 MED ORDER — HYDROXYZINE PAMOATE 25 MG PO CAPS: 25.0000 mg | ORAL_CAPSULE | Freq: Three times a day (TID) | ORAL | 0 refills | Status: DC | PRN

## 2019-03-02 MED FILL — HYDROXYZINE PAM 25 MG CAP: 25 | 30 days supply | Qty: 90 | Fill #0

## 2019-03-07 ENCOUNTER — Telehealth (HOSPITAL_COMMUNITY): Payer: Self-pay | Admitting: Psychiatry

## 2019-03-07 ENCOUNTER — Ambulatory Visit (INDEPENDENT_AMBULATORY_CARE_PROVIDER_SITE_OTHER): Payer: BLUE CROSS/BLUE SHIELD | Admitting: Psychiatry

## 2019-03-07 ENCOUNTER — Other Ambulatory Visit: Payer: Self-pay

## 2019-03-07 DIAGNOSIS — F324 Major depressive disorder, single episode, in partial remission: Secondary | ICD-10-CM | POA: Diagnosis not present

## 2019-03-07 NOTE — Progress Notes (Signed)
BH MD/PA/NP OP Progress Note  03/07/2019 3:25 PM OTHELLO SGROI  MRN:  [id number]  Chief Complaint: med check HPI: [name] Visit Diagnosis: Major depression recurrent This patient's major complaint is around feeling depressed and anxious.  She was seen only about a month ago.  She wanted to be seen again for her next scheduled visit.  We made efforts to try to getting her into the IOP program but we were unsuccessful.  There was apparently some scheduling issues.  Patient lives alone.  She feels quite isolated.  She is very frightened of getting the coronavirus.  The only one she talks to his her sister brings her food.  The patient's mood is improved on Zoloft 100 mg.  She continues to have chronic back pain but there is not much movement about getting a surgical intervention.  Her sister is very financially supportive for her.  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  Today we talked about the importance of psychotherapy.  The patient has issues related to her daughter who is gay.  Patient is uncomfortable with this.  The patient is sleeping and eating fairly well.  Her anxiety level escalated after I saw her and we called her in Vistaril 25 mg 3 times daily.  She is yet to start this medicine.  The patient denies being suicidal.  She shows no evidence of psychosis.  She is functioning fairly well.  She denies being lonely.  She does not watch the TV but does not seem to get very much enjoyment out of it.  Patient denies any use of drugs or alcohol.  Past Psychiatric History: See intake H&P for full details. Reviewed, with no updates at this time.  Past Medical History:  Past Medical History:  Diagnosis Date  . 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 Breast cancer ([place]) 07/17/12   at age 28, ER/PR +, hER 2 -  . Cancer ([place]) 07/07/12   Left Breast Inv Ductal Ca.  . Degenerative disk disease 2010   Lumbar spine  . GERD (gastroesophageal  reflux disease)    hx  . H/O hiatal hernia    small  . Hyperlipidemia   . Knee pain, right   . No pertinent past medical history   . Obesity   . S/P radiation therapy 11/30/12 - 01/12/13   Left Breast / 50 gray in 25 Fractions with boost 10 gray in 5 Fractions  . Status post chemotherapy comp 10/27/12    4 CyclesTaxotere/ Cytoxan  . Status post chemotherapy Comp. 01/12/13   neoadjuvant chemotherapy: 4 Cycles of Taxotere/Cytoxan  . Use of tamoxifen (Nolvadex) 01/18/13    Past Surgical History:  Procedure Laterality Date  . ABDOMINAL HYSTERECTOMY  04/2000   partial, fibroids  . 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  Marland Kitchen BREATH TEK H PYLORI  12/07/2011   Procedure: BREATH TEK H PYLORI;  Surgeon: Shann Medal, MD;  Location: [facility];  Service: Endoscopy;  Laterality: N/A;  . COLONOSCOPY    . Needle Core Biopsy  06/30/12   Left Breast: 12 O'clock/ Benign Parenchyma    Family Psychiatric History: See intake H&P for full details. Reviewed, with no updates at  this time.   Family History:  Family History  Problem Relation Age of Onset  . Colon cancer Father 28  . Alcohol abuse Father   . Colon polyps Brother   . Anxiety disorder Brother   . Depression Brother   . Breast cancer Sister 34  . Obesity Sister   . Anxiety disorder Sister   . Depression Sister   . Anxiety disorder Mother   . Depression Mother   . Bipolar disorder Mother   . Lymphoma Brother   . Drug abuse Brother   . Anxiety disorder Sister   . Depression Sister   . Bipolar disorder Sister   . Esophageal cancer Neg Hx   . Stomach cancer Neg Hx   . Rectal cancer Neg Hx     Social History:  Social History   Socioeconomic History  . Marital status: Divorced    Spouse name: Not  on file  . Number of children: 1  . Years of education: Not on file  . Highest education level: Associate degree: occupational, Hotel manager, or vocational program  Occupational History  . Occupation: Pharmacologist    CommentAstronomer  Social Needs  . Financial resource strain: Hard  . Food insecurity:    Worry: Often true    Inability: Often true  . Transportation needs:    Medical: No    Non-medical: No  Tobacco Use  . Smoking status: Never Smoker  . Smokeless tobacco: Never Used  Substance and Sexual Activity  . Alcohol use: No  . Drug use: No  . Sexual activity: Yes  Lifestyle  . Physical activity:    Days per week: 0 days    Minutes per session: 0 min  . Stress: Rather much  Relationships  . Social connections:    Talks on phone: Never    Gets together: Never    Attends religious service: Never    Active member of club or organization: No    Attends meetings of clubs or organizations: Never    Relationship status: Separated  Other Topics Concern  . Not on file  Social History Narrative   Married for 20+ years, divorced Dec 2017   Living on own       Allergies:  Allergies  Allergen Reactions  . Sulfa Antibiotics Shortness Of Breath and Swelling    Metabolic Disorder Labs: Lab Results  Component Value Date   HGBA1C 5.6 05/22/2018   No results found for: PROLACTIN Lab Results  Component Value Date   CHOL 209 (H) 05/22/2018   TRIG 265.0 (H) 05/22/2018   HDL 52.40 05/22/2018   CHOLHDL 4 05/22/2018   VLDL 53.0 (H) 05/22/2018   LDLCALC 90 01/01/2016   Lab Results  Component Value Date   TSH 1.66 11/21/2017    Therapeutic Level Labs: No results found for: LITHIUM No results found for: VALPROATE No components found for:  CBMZ  Current Medications: Current Outpatient Medications  Medication Sig Dispense Refill  . cyclobenzaprine (FLEXERIL) 10 MG tablet Take 1 tablet (10 mg total) by mouth 3 (three) times daily as needed for muscle spasms. 30 tablet 0   . hydrochlorothiazide (MICROZIDE) 12.5 MG capsule Take 1 capsule (12.5 mg total) by mouth daily. 30 capsule 11  . HYDROcodone-acetaminophen (NORCO/VICODIN) 5-325 MG tablet as needed.  0  . hydrOXYzine (VISTARIL) 25 MG capsule Take 1 capsule (25 mg total) by mouth 3 (three) times daily as needed for anxiety. 90 capsule 0  . meloxicam (MOBIC) 15 MG tablet Take 1 tablet (  15 mg total) by mouth daily. 30 tablet 2  . omeprazole (PRILOSEC) 20 MG capsule Take 1 capsule (20 mg total) by mouth daily. 90 capsule 1  . sertraline (ZOLOFT) 100 MG tablet 1  qam 30 tablet 6  . tamoxifen (NOLVADEX) 20 MG tablet Take 1 tablet (20 mg total) by mouth daily. 90 tablet 3  . venlafaxine XR (EFFEXOR XR) 150 MG 24 hr capsule Take 1 capsule (150 mg total) by mouth daily with breakfast. 90 capsule 0  . vitamin B-12 (CYANOCOBALAMIN) 1000 MCG tablet Take 1 tablet (1,000 mcg total) by mouth daily. 30 tablet 0   No current facility-administered medications for this visit.      Musculoskeletal: Strength & Muscle Tone: within normal limits Gait & Station: normal Patient leans: N/A  Psychiatric Specialty Exam: ROS  There were no vitals taken for this visit.There is no height or weight on file to calculate BMI.  General Appearance: Casual and Fairly Groomed  Eye Contact:  Good  Speech:  Normal Rate  Volume:  Decreased  Mood:  Depressed and Dysphoric  Affect:  Appropriate and Congruent  Thought Process:  Goal Directed and Descriptions of Associations: Intact  Orientation:  Full (Time, Place, and Person)  Thought Content: Logical   Suicidal Thoughts:  No  Homicidal Thoughts:  No  Memory:  Immediate;   Good  Judgement:  Good  Insight:  Good  Psychomotor Activity:  Normal  Concentration:  Concentration: Good  Recall:  Good  Fund of Knowledge: Good  Language: Good  Akathisia:  Negative  Handed:  Right  AIMS (if indicated): not done  Assets:  Housing Microbiologist Vocational/Educational   ADL's:  Intact  Cognition: WNL  Sleep:  Fair   Screenings: GAD-7     Office Visit from 04/22/2017 in [place]  Total GAD-7 Score  5    PHQ2-9     Office Visit from 04/22/2017 in [facility]  PHQ-2 Total Score  1  PHQ-9 Total Score  6      Assessment and Plan:    At this time this patient seems to be suffering from clinical depression and is shown a moderate response to Zoloft.  She will be seen again in approximately 6 weeks and if she is not better we will increase it.  Her most important intervention at this time will be to attempt to get her a psychotherapist.  She is never been in therapy.  She is willing to try it.  Patient does experience acute anxiety.  She will be instructed to take Vistaril regularly but also when she has the acute anxiety.  So she will take the Vistaril 1 in the morning and 1 at night and was instructed to take 1 when she is having acute anxiety and ability to repeated a half an hour later if she is not better.  Today the patient was also given the one 800-number for suicide.  It is the 1 800 273 TAL K.  She is instructed to call us if there is problems.  I am concerned that she will fall through the cracks.  We will make every attempt to get her into the IOP program and work to be in psychotherapy.  She will come back to see me in 6 weeks.  Today again she acknowledges that she is not feeling acutely suicidal but she is feeling anxious and worried. Status of current problems: gradually improving  Labs Ordered: No orders of  the defined types were placed in this encounter.   Labs Reviewed: n/a  Collateral Obtained/Records Reviewed: n/a  Plan:  Increase Effexor to 75 mg XR for 1 month, then increase to 150 mg XR daily Vistaril 25 mg TID prn for anxiety, sleep RTC 10-12 weeks follow up with Dr. Casimiro Needle Therapy referral Joslyn Devon in 3-4 weeks  Jerral Ralph, MD 03/07/2019, 3:25 PM

## 2019-03-07 NOTE — Telephone Encounter (Signed)
D:  Pt was referred by Dr. Casimiro Needle to start [place].  A:  Placed call to pt to re-orient her.  Pt will start virtual MH-IOP tomorrow at 9 a.m  R:  Pt receptive.

## 2019-03-08 ENCOUNTER — Other Ambulatory Visit (HOSPITAL_COMMUNITY): Payer: BLUE CROSS/BLUE SHIELD | Attending: Psychiatry | Admitting: Psychiatry

## 2019-03-08 ENCOUNTER — Other Ambulatory Visit: Payer: Self-pay

## 2019-03-08 ENCOUNTER — Encounter (HOSPITAL_COMMUNITY): Payer: Self-pay | Admitting: Psychiatry

## 2019-03-08 DIAGNOSIS — F329 Major depressive disorder, single episode, unspecified: Secondary | ICD-10-CM | POA: Diagnosis not present

## 2019-03-08 DIAGNOSIS — R45851 Suicidal ideations: Secondary | ICD-10-CM | POA: Diagnosis not present

## 2019-03-08 DIAGNOSIS — F419 Anxiety disorder, unspecified: Secondary | ICD-10-CM | POA: Diagnosis not present

## 2019-03-08 DIAGNOSIS — F324 Major depressive disorder, single episode, in partial remission: Secondary | ICD-10-CM

## 2019-03-08 MED FILL — OMEPRAZOLE 20 MG CPDR: 20 | 30 days supply | Qty: 30 | Fill #3

## 2019-03-08 NOTE — Progress Notes (Signed)
Joann Page is a 58 y.o., separated, unemployed, Caucasian female, who was referred per Dr. Casimiro Needle; treatment for worsening depressive and anxiety symptoms, with passive SI (no plan or intent).  Pt's intake was done via virtual d/t COVID-19 social distancing precautions.  According to pt, the sx's worsened one week ago.  Current stressors/triggers:  1)  Fearful that she will get COVID-19.  2)  The divorce after being married 30 yrs.  3)  34 yr old daughter informing her that she is a lesbian.  4)  Continued back issues.  States she is trying to find a doctor to do the surgery.  Pt denies any drugs/ETOH.As per previous [facility] note states:  pt no previous psychiatric hx prior to seeing Dr. Daron Offer 05/27/18.  Pt reports dx of spinal stenosis for pat 10years with increased back pain that she has been struggling with. pt reports breast cancer completed radiation and infusions and now tx w/ tamoxifen which will be for 10 years.  pt reports her pain and family drama are her biggest stressors.  she lives beside her sister since divorce and her sister suffers w/ a lot of mental health issues and now she feels in middle as her sister and neice are in conflict but she has a relationship w/ both.  pt reports a lot of financial stress as well as no income- from divorce settlement her exhusband maintains health insurance on her and she received money from sale of home- which used to purchase her town home.  pt reports w/ pain she is unable to work.  pt reports she relies on her sister and exhusband for fiancial support.   Patients Currently Reported Symptoms/Problems: Pt reports she feels depressed and stressed w/ her financial, medical and family issues.  Pt reports that she is having more anxiety and worry as faces decisions re: medical care.  Pt reported had an MRI last year and doctor informed next step is back surgery to fuse spine and pt is anxious about this- recalling all the failed or errors of family members surgeries  over the past couple of years.  pt reports the pain is so intense at times.   CC: previous notes for more hx. A:  Oriented pt to MH-IOP; answered all her questions.  Pt gave verbal consent for tx, to release information to referred providers and to complete any forms if needed.  Pt also gave consent for attending group virtually d/t COVID-19 social distancing restrictions.  Encouraged online support groups thru [facility] [facility] [place].  R:  Pt receptive.     Carlis Abbott, RITA, M.Ed,CNA

## 2019-03-09 ENCOUNTER — Ambulatory Visit (HOSPITAL_COMMUNITY): Payer: BLUE CROSS/BLUE SHIELD | Admitting: Psychiatry

## 2019-03-12 ENCOUNTER — Other Ambulatory Visit (HOSPITAL_COMMUNITY): Payer: BLUE CROSS/BLUE SHIELD | Admitting: Psychiatry

## 2019-03-12 ENCOUNTER — Telehealth (HOSPITAL_COMMUNITY): Payer: Self-pay | Admitting: Psychiatry

## 2019-03-12 ENCOUNTER — Other Ambulatory Visit: Payer: Self-pay

## 2019-03-12 MED FILL — hydrOXYzine HCL 10 MG TABS: 10 | 30 days supply | Qty: 60 | Fill #0

## 2019-03-12 NOTE — Telephone Encounter (Signed)
D:  Dr. Casimiro Needle called writer back and stated that he would be calling in Atarax 10 mg BID for pt at her designated pharmacy ([facility]).  A:  Placed call to pt to inform her.  Patient is to discontinue Vistaril and not take it for two days and on the third day she is to start the Atarax on the third day.  Pt confirmed she didn't take any Vistaril today.  Inform Beather Arbour, RN.  R:  Pt receptive.

## 2019-03-12 NOTE — Telephone Encounter (Signed)
D:  Pt has concerns re: Vistaril (too sedating).  A:  Placed call to Dr. Casimiro Needle and left message for him to call writer back.  Await call back or discuss with Ricky Ala, NP tomorrow.

## 2019-03-12 NOTE — Telephone Encounter (Signed)
D:  Placed call to check on patient since she was excused from group last Friday.  Pt continues to c/o of sedation with new medication (Vistaril 25 mg  TID prn) for anxiety.  "I slept to 12 noon this past Friday.  I don't like how it makes me too sleepy."  Reports it is helping with the anxiety, but it just makes her too sedated.  Pt also mentioned that sitting up for group is really aggravating her back pain.  Pt states she does want to continue in the virtual groups, but was wondering if she could lay instead of sitting up and not have her camera on?  A:  Provided pt with support and reassured her that the virtual group's goal is to have one comfortable within the safety of their home; in order to practice social distancing.  Encouraged pt to continue attending the virtual groups, even if her camera isn't on.  Will redirect pt's question about Vistaril to Dr. Casimiro Needle today or pt will discuss with Ricky Ala, NP tomorrow. R:  Pt receptive.

## 2019-03-13 ENCOUNTER — Encounter (HOSPITAL_COMMUNITY): Payer: Self-pay | Admitting: Psychiatry

## 2019-03-13 ENCOUNTER — Other Ambulatory Visit (HOSPITAL_COMMUNITY): Payer: BLUE CROSS/BLUE SHIELD | Admitting: Psychiatry

## 2019-03-13 ENCOUNTER — Other Ambulatory Visit: Payer: Self-pay

## 2019-03-13 ENCOUNTER — Telehealth (HOSPITAL_COMMUNITY): Payer: Self-pay | Admitting: Psychiatry

## 2019-03-13 NOTE — Progress Notes (Signed)
Virtual Visit via Video Note  I connected with Joann Page on 03/13/19 at  9:00 AM EDT by a video enabled telemedicine application and verified that I am speaking with the correct person using two identifiers.   I discussed the limitations of evaluation and management by telemedicine and the availability of in person appointments. The patient expressed understanding and agreed to proceed.   I discussed the assessment and treatment plan with the patient. The patient was provided an opportunity to ask questions and all were answered. The patient agreed with the plan and demonstrated an understanding of the instructions.   The patient was advised to call back or seek an in-person evaluation if the symptoms worsen or if the condition fails to improve as anticipated.      Daily Group Progress Note  Program: IOP  Group Time: 9am-12pm   Participation Level: Active   Behavioral Response: Appropriate   Type of Therapy:  Group Therapy; psycho-educational group, process group   Summary of Progress:  The purpose of this group is to utilize CBT and DBT skills in a group setting to increase use of healthy coping skills and decrease frequency and intensity of active mental health symptoms.   9am-10:30am Clinician checked in with group members, assessing for SI/HI/psychosis. Clinician and group members discussed stressors and skills from the weekend. Clinician facilitated ice breaker activity with clients identifying a favorite something in any topic and exploring gratitude for their respective subject. Clinician introduced the topic of needs identification and assessed the ways clients may go about fulfilling those needs and the feelings that arise when they are not met.  Clinician actively listened to clients, utilizing summarizing and re-framing statements. Clinician praised group members for identifying cognitive distortions they are actively working on addressing.    10:30am-12pm Clinician  invited group members to consider their attitudes toward asking for help including a cost benefit analysis and an exploration of past events that influence group member's current behavior. Clinician presented clients with positive affirmations, and requested that group members share an affirmation they believe to be related to themselves.  Client has made progress in therapy as evidenced by her willingness to engage in therapy and her honesty toward her current life stressors. Client described her tendency for using denial as a coping strategy and the impact of isolation on her. Client is hesitant to ask for help and expresses those feelings as emerging from her status before a major life change and her concerns toward judgments from others.   Client actively participated in therapy, and expressed interest in continuing.  Client has made progress in therapy as evidenced by her willingness to engage in therapy and her honesty toward her current life stressors. Client described her tendency for using denial as a coping strategy and the impact of isolation on her. Client is hesitant to ask for help and expresses those feelings as emerging from her status before a major life change and her concerns toward judgments from others.   Client actively participated in therapy, and expressed interest in continuing.   Olegario Messier, LCSW

## 2019-03-13 NOTE — Progress Notes (Signed)
Attempted to follow-up with patient for new admission assessment. Patient wasn't on Webex or no answer via phone listed in chart. Will attempt to f/u on 4/15

## 2019-03-13 NOTE — Progress Notes (Deleted)
Psychiatric Initial Adult Assessment   Patient Identification: Joann Page MRN:  [id number] Date of Evaluation:  03/13/2019 Referral Source: *** Chief Complaint:   Visit Diagnosis: No diagnosis found.  History of Present Illness:  ***  Associated Signs/Symptoms: Depression Symptoms:  {DEPRESSION SYMPTOMS:20000} (Hypo) Manic Symptoms:  {BHH MANIC SYMPTOMS:22872} Anxiety Symptoms:  {BHH ANXIETY SYMPTOMS:22873} Psychotic Symptoms:  {BHH PSYCHOTIC SYMPTOMS:22874} PTSD Symptoms: {BHH PTSD SYMPTOMS:22875}  Past Psychiatric History: ***  Previous Psychotropic Medications: {YES/NO:21197}  Substance Abuse History in the last 12 months:  {yes no:314532}  Consequences of Substance Abuse: {BHH CONSEQUENCES OF SUBSTANCE ABUSE:22880}  Past Medical History:  Past Medical History:  Diagnosis Date  . Anxiety   . 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 Breast cancer ([place]) 07/17/12   at age 79, ER/PR +, hER 2 -  . Cancer ([place]) 07/07/12   Left Breast Inv Ductal Ca.  . Degenerative disk disease 2010   Lumbar spine  . Depression   . GERD (gastroesophageal reflux disease)    hx  . H/O hiatal hernia    small  . Hyperlipidemia   . Knee pain, right   . No pertinent past medical history   . Obesity   . S/P radiation therapy 11/30/12 - 01/12/13   Left Breast / 50 gray in 25 Fractions with boost 10 gray in 5 Fractions  . Status post chemotherapy comp 10/27/12    4 CyclesTaxotere/ Cytoxan  . Status post chemotherapy Comp. 01/12/13   neoadjuvant chemotherapy: 4 Cycles of Taxotere/Cytoxan  . Use of tamoxifen (Nolvadex) 01/18/13    Past Surgical History:  Procedure Laterality Date  . ABDOMINAL HYSTERECTOMY  04/2000   partial, fibroids  . 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  Marland Kitchen BREATH TEK H PYLORI  12/07/2011   Procedure: BREATH TEK H PYLORI;  Surgeon: Shann Medal, MD;  Location: [facility];  Service: Endoscopy;  Laterality: N/A;  . COLONOSCOPY    . Needle Core Biopsy  06/30/12   Left Breast: 12 O'clock/ Benign Parenchyma    Family Psychiatric History: ***  Family History:  Family History  Problem Relation Age of Onset  . Colon cancer Father 69  . Alcohol abuse Father   . Colon polyps Brother   . Anxiety disorder Brother   . Depression Brother   . Breast cancer Sister 5  . Obesity Sister   . Anxiety disorder Sister   . Depression Sister   . Anxiety disorder Mother   . Depression Mother   . Bipolar disorder Mother   . Lymphoma Brother   . Drug abuse Brother   . Anxiety disorder Sister   . Depression Sister   . Bipolar disorder Sister   . Esophageal cancer Neg Hx   . Stomach cancer Neg Hx   . Rectal cancer Neg Hx     Social History:   Social History   Socioeconomic History  . Marital status: Divorced    Spouse name: Not on file  . Number of children: 1  . Years of education: Not on file  . Highest education level: Associate degree: occupational, Hotel manager, or vocational program  Occupational History  . Occupation: Pharmacologist    CommentAstronomer  Social Needs  . Financial resource strain: Hard  . Food insecurity:  Worry: Often true    Inability: Often true  . Transportation needs:    Medical: No    Non-medical: No  Tobacco Use  . Smoking status: Never Smoker  . Smokeless tobacco: Never Used  Substance and Sexual Activity  . Alcohol use: No  . Drug use: No  . Sexual activity: Yes  Lifestyle  . Physical activity:    Days per week: 0 days    Minutes per session: 0 min  . Stress: Rather much  Relationships  . Social connections:    Talks on phone: Never    Gets together: Never    Attends religious service: Never    Active member of club or organization: No    Attends meetings of clubs or organizations: Never     Relationship status: Separated  Other Topics Concern  . Not on file  Social History Narrative   Married for 20+ years, divorced Dec 2017   Living on own       Additional Social History: ***  Allergies:   Allergies  Allergen Reactions  . Sulfa Antibiotics Shortness Of Breath and Swelling    Metabolic Disorder Labs: Lab Results  Component Value Date   HGBA1C 5.6 05/22/2018   No results found for: PROLACTIN Lab Results  Component Value Date   CHOL 209 (H) 05/22/2018   TRIG 265.0 (H) 05/22/2018   HDL 52.40 05/22/2018   CHOLHDL 4 05/22/2018   VLDL 53.0 (H) 05/22/2018   LDLCALC 90 01/01/2016   Lab Results  Component Value Date   TSH 1.66 11/21/2017    Therapeutic Level Labs: No results found for: LITHIUM No results found for: CBMZ No results found for: VALPROATE  Current Medications: Current Outpatient Medications  Medication Sig Dispense Refill  . cyclobenzaprine (FLEXERIL) 10 MG tablet Take 1 tablet (10 mg total) by mouth 3 (three) times daily as needed for muscle spasms. 30 tablet 0  . hydrochlorothiazide (MICROZIDE) 12.5 MG capsule Take 1 capsule (12.5 mg total) by mouth daily. 30 capsule 11  . HYDROcodone-acetaminophen (NORCO/VICODIN) 5-325 MG tablet as needed.  0  . hydrOXYzine (ATARAX/VISTARIL) 10 MG tablet Take 10 mg by mouth 2 (two) times daily. Patient is to not take Vistaril for two days and on the third day start Atarax 10 mg BID per Dr. Casimiro Needle.    . meloxicam (MOBIC) 15 MG tablet Take 1 tablet (15 mg total) by mouth daily. 30 tablet 2  . omeprazole (PRILOSEC) 20 MG capsule Take 1 capsule (20 mg total) by mouth daily. 90 capsule 1  . sertraline (ZOLOFT) 100 MG tablet 1  qam 30 tablet 6  . tamoxifen (NOLVADEX) 20 MG tablet Take 1 tablet (20 mg total) by mouth daily. 90 tablet 3  . venlafaxine XR (EFFEXOR XR) 150 MG 24 hr capsule Take 1 capsule (150 mg total) by mouth daily with breakfast. 90 capsule 0  . vitamin B-12 (CYANOCOBALAMIN) 1000 MCG tablet Take 1  tablet (1,000 mcg total) by mouth daily. 30 tablet 0   No current facility-administered medications for this visit.     Musculoskeletal: Strength & Muscle Tone: {desc; muscle tone:32375} Gait & Station: {PE GAIT ED QPYP:95093} Patient leans: {Patient Leans:21022755}  Psychiatric Specialty Exam: ROS  There were no vitals taken for this visit.There is no height or weight on file to calculate BMI.  General Appearance: {Appearance:22683}  Eye Contact:  {BHH EYE CONTACT:22684}  Speech:  {Speech:22685}  Volume:  {Volume (PAA):22686}  Mood:  {BHH MOOD:22306}  Affect:  {Affect (PAA):22687}  Thought Process:  {Thought Process (PAA):22688}  Orientation:  {BHH ORIENTATION (PAA):22689}  Thought Content:  {Thought Content:22690}  Suicidal Thoughts:  {ST/HT (PAA):22692}  Homicidal Thoughts:  {ST/HT (PAA):22692}  Memory:  {BHH MEMORY:22881}  Judgement:  {Judgement (PAA):22694}  Insight:  {Insight (PAA):22695}  Psychomotor Activity:  {Psychomotor (PAA):22696}  Concentration:  {Concentration:21399}  Recall:  {BHH GOOD/FAIR/POOR:22877}  Fund of Knowledge:{BHH GOOD/FAIR/POOR:22877}  Language: {BHH GOOD/FAIR/POOR:22877}  Akathisia:  {BHH YES OR NO:22294}  Handed:  {Handed:22697}  AIMS (if indicated):  {Desc; done/not:10129}  Assets:  {Assets (PAA):22698}  ADL's:  {BHH JEH'U:31497}  Cognition: {chl bhh cognition:304700322}  Sleep:  {BHH GOOD/FAIR/POOR:22877}   Screenings: GAD-7     Office Visit from 04/22/2017 in [facility]  Total GAD-7 Score  5    PHQ2-9     Office Visit from 04/22/2017 in [facility]  PHQ-2 Total Score  1  PHQ-9 Total Score  6      Assessment and Plan: ***   Derrill Center, NP 4/14/20209:08 AM

## 2019-03-14 ENCOUNTER — Other Ambulatory Visit (HOSPITAL_COMMUNITY): Payer: BLUE CROSS/BLUE SHIELD | Admitting: Psychiatry

## 2019-03-14 ENCOUNTER — Other Ambulatory Visit: Payer: Self-pay

## 2019-03-14 NOTE — Progress Notes (Signed)
Virtual Visit via Telephone Note  I connected with Joann Page on 03/14/19 at  9:00 AM EDT by telephone and verified that I am speaking with the correct person using two identifiers.   I discussed the limitations, risks, security and privacy concerns of performing an evaluation and management service by telephone and the availability of in person appointments. I also discussed with the patient that there may be a patient responsible charge related to this service. The patient expressed understanding and agreed to proceed. Previous Note States:  Joann Page is a 58 y.o., separated, unemployed, Caucasian female, who was referred per Dr. Casimiro Needle; treatment for worsening depressive and anxiety symptoms, with passive SI (no plan or intent).  Pt's intake was done via virtual d/t COVID-19 social distancing precautions.  According to pt, the sx's worsened one week ago.  Current stressors/triggers:  1)  Fearful that she will get COVID-19.  2)  The divorce after being married 23 yrs.  3)  34 yr old daughter informing her that she is a lesbian.  4)  Continued back issues.  States she is trying to find a doctor to do the surgery.  Pt denies any drugs/ETOH.As per previous [facility] note states:  pt no previous psychiatric hx prior to seeing Dr. Daron Offer 05/27/18. Pt reports dx of spinal stenosis for pat 10years with increased back pain that she has been struggling with. pt reports breast cancer completed radiation and infusions and now tx w/ tamoxifen which will be for 10 years. pt reports her pain and family drama are her biggest stressors. she lives beside her sister since divorce and her sister suffers w/ a lot of mental health issues and now she feels in middle as her sister and neice are in conflict but she has a relationship w/ both. pt reports a lot of financial stress as well as no income- from divorce settlement her exhusband maintains health insurance on her and she received money from sale of home- which used to  purchase her town home. pt reports w/ pain she is unable to work. pt reports she relies on her sister and exhusband for fiancial support.  Patients Currently Reported Symptoms/Problems: Pt reports she feels depressed and stressed w/ her financial, medical and family issues. Pt reports that she is having more anxiety and worry as faces decisions re: medical care. Pt reported had an MRI last year and doctor informed next step is back surgery to fuse spine and pt is anxious about this- recalling all the failed or errors of family members surgeries over the past couple of years. pt reports the pain is so intense at times.  CC: previous notes for more hx. Patient only attended [facility] for one day.  The first couple of days she called out due to sedation and now she states that her back pain is too severe to be sitting up for three hours Mon-Fri.  Pt had inquired if she could lay and participate in group.  This Probation officer had given pt permission to lay or get in whatever position she needed to in order to feel comfortable or take breaks in order to continue in [place].  Pt sent an email stating that she wouldn't be logging back on for group due to the back pain.  Denies any SI/HI  or A/V hallucinations. A:  Will discharge pt today.  F/U with Dr. Casimiro Needle.  Recommended referral to a therapist.  Encouraged pt to return to [facility] once she gets the pain under control.  Encouraged online support groups d/t COVID-19 precautions.  R:  Pt receptive.  I discussed the assessment and treatment plan with the patient. The patient was provided an opportunity to ask questions and all were answered. The patient agreed with the plan and demonstrated an understanding of the instructions.   The patient was advised to call back or seek an in-person evaluation if the symptoms worsen or if the condition fails to improve as anticipated.  I provided  minutes of non-face-to-face time during this encounter.   Carlis Abbott, RITA, M.Ed,  CNA

## 2019-03-15 ENCOUNTER — Other Ambulatory Visit: Payer: Self-pay

## 2019-03-15 ENCOUNTER — Other Ambulatory Visit (HOSPITAL_COMMUNITY): Payer: BLUE CROSS/BLUE SHIELD

## 2019-03-15 MED FILL — HYDROCODON-APAP 10-325: 10-325 | 30 days supply | Qty: 60 | Fill #0

## 2019-03-15 NOTE — Patient Instructions (Signed)
D:  Patient requesting discharge from [facility] due to severe pain.  A:  Follow up with Dr. Casimiro Needle on 04-25-19 @ 3:30 pm and Jan Fireman, [facility] on 04-04-19 @ 11 am.  Encouraged support groups.  R:  Pt receptive.

## 2019-03-16 ENCOUNTER — Ambulatory Visit (HOSPITAL_COMMUNITY): Payer: BLUE CROSS/BLUE SHIELD

## 2019-03-16 MED FILL — HYDROCHLOROTHIAZIDE 12.5 MG: 12.5 | 30 days supply | Qty: 30 | Fill #10

## 2019-03-16 MED FILL — TAMOXIFEN CITRATE 20 MG TAB: 20 | 30 days supply | Qty: 30 | Fill #9

## 2019-03-19 ENCOUNTER — Ambulatory Visit (HOSPITAL_COMMUNITY): Payer: BLUE CROSS/BLUE SHIELD

## 2019-03-20 ENCOUNTER — Ambulatory Visit (HOSPITAL_COMMUNITY): Payer: BLUE CROSS/BLUE SHIELD

## 2019-03-21 ENCOUNTER — Ambulatory Visit (HOSPITAL_COMMUNITY): Payer: BLUE CROSS/BLUE SHIELD

## 2019-03-22 ENCOUNTER — Ambulatory Visit (HOSPITAL_COMMUNITY): Payer: BLUE CROSS/BLUE SHIELD

## 2019-03-23 ENCOUNTER — Ambulatory Visit (HOSPITAL_COMMUNITY): Payer: BLUE CROSS/BLUE SHIELD

## 2019-03-26 ENCOUNTER — Ambulatory Visit (HOSPITAL_COMMUNITY): Payer: BLUE CROSS/BLUE SHIELD

## 2019-03-27 ENCOUNTER — Ambulatory Visit (HOSPITAL_COMMUNITY): Payer: BLUE CROSS/BLUE SHIELD

## 2019-03-28 ENCOUNTER — Ambulatory Visit (HOSPITAL_COMMUNITY): Payer: BLUE CROSS/BLUE SHIELD

## 2019-03-29 ENCOUNTER — Ambulatory Visit (HOSPITAL_COMMUNITY): Payer: BLUE CROSS/BLUE SHIELD

## 2019-03-30 ENCOUNTER — Ambulatory Visit (HOSPITAL_COMMUNITY): Payer: BLUE CROSS/BLUE SHIELD

## 2019-04-04 ENCOUNTER — Other Ambulatory Visit: Payer: Self-pay

## 2019-04-04 ENCOUNTER — Ambulatory Visit (HOSPITAL_COMMUNITY): Payer: BLUE CROSS/BLUE SHIELD | Admitting: Psychology

## 2019-04-10 MED FILL — OMEPRAZOLE 20 MG CPDR: 20 | 30 days supply | Qty: 30 | Fill #4

## 2019-04-12 MED FILL — HYDROCODON-APAP 10-325: 10-325 | 30 days supply | Qty: 60 | Fill #0

## 2019-04-16 MED FILL — HYDROCHLOROTHIAZIDE 12.5 MG: 12.5 | 30 days supply | Qty: 30 | Fill #11

## 2019-04-16 MED FILL — TAMOXIFEN CITRATE 20 MG TAB: 20 | 30 days supply | Qty: 30 | Fill #10

## 2019-04-17 DIAGNOSIS — M545 Low back pain: Secondary | ICD-10-CM | POA: Diagnosis not present

## 2019-04-18 ENCOUNTER — Telehealth: Payer: Self-pay | Admitting: Internal Medicine

## 2019-04-18 NOTE — Telephone Encounter (Signed)
Contacted pt to schedule CPE.

## 2019-04-18 NOTE — Telephone Encounter (Signed)
Last CPE 05/22/18. COVID risk score-3. Please advise what you prefer.

## 2019-04-18 NOTE — Telephone Encounter (Signed)
Patient called requesting to schedule a physical with Dr Sharlet Salina. Okay for patient to be seen in the office or should this be done virtually or schedule a couple months out?

## 2019-04-18 NOTE — Telephone Encounter (Signed)
Can do in person anytime, is overdue for visit

## 2019-04-19 ENCOUNTER — Telehealth: Payer: Self-pay | Admitting: Hematology and Oncology

## 2019-04-19 NOTE — Telephone Encounter (Signed)
Call day 6/23 moved f/u to AM. Confirmed with patient.

## 2019-04-20 ENCOUNTER — Encounter: Payer: BLUE CROSS/BLUE SHIELD | Admitting: Internal Medicine

## 2019-04-25 ENCOUNTER — Other Ambulatory Visit: Payer: Self-pay

## 2019-04-25 ENCOUNTER — Ambulatory Visit (INDEPENDENT_AMBULATORY_CARE_PROVIDER_SITE_OTHER): Payer: BLUE CROSS/BLUE SHIELD | Admitting: Psychiatry

## 2019-04-25 DIAGNOSIS — F320 Major depressive disorder, single episode, mild: Secondary | ICD-10-CM

## 2019-04-25 MED ORDER — SERTRALINE HCL 100 MG PO TABS: ORAL_TABLET | ORAL | 6 refills | Status: DC

## 2019-04-25 NOTE — Progress Notes (Signed)
BH MD/PA/NP OP Progress Note  04/25/2019 4:30 PM Joann Page  MRN:  [id number]  Chief Complaint: med check HPI: Joann Page Visit Diagnosis: Major depression recurrent Unfortunately due to a scheduling issue the patient was not called for 45 minutes.  That is it was after her scheduled appointment time 45 minutes late.  The reality is due to a scheduling problem into writing I was calling her simply to check on her.  She was upset that we did not start on time.  Overall on the other hand the patient actually feels a bit better.  She would describe herself as being 30 to 40% better taking 100 mg of Zoloft.  Today we reviewed her chart together and we both agree that she would increase the Zoloft to taking 150 mg.  She does say the Vistaril is helpful for anxiety and that she has less acute attacks.  She did take part in the IOP for a few sessions but she could not sit still because of back pain.  She therefore really did not get any therapeutic benefit from it she herself seem to have dropped out because of back pain.  Today we talked about the benefit of psychotherapy which she is never been.  Apparently once again things fell through the cracks.  Today we made sure to get a therapist here and will make her an appointment in the next few weeks.  The patient overall is stable.  She has ups and downs.  Past Psychiatric History: See intake H&P for full details. Reviewed, with no updates at this time.  Past Medical History:  Past Medical History:  Diagnosis Date  . Anxiety   . 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 Breast cancer ([place]) 07/17/12   at age 89, ER/PR +, hER 2 -  . Cancer ([place]) 07/07/12   Left Breast Inv Ductal Ca.  . Degenerative disk disease 2010   Lumbar spine  . Depression   . GERD (gastroesophageal reflux disease)    hx  . H/O hiatal hernia    small  . Hyperlipidemia   . Knee pain, right   . No pertinent past medical history   . Obesity   . S/P radiation therapy 11/30/12 -  01/12/13   Left Breast / 50 gray in 25 Fractions with boost 10 gray in 5 Fractions  . Status post chemotherapy comp 10/27/12    4 CyclesTaxotere/ Cytoxan  . Status post chemotherapy Comp. 01/12/13   neoadjuvant chemotherapy: 4 Cycles of Taxotere/Cytoxan  . Use of tamoxifen (Nolvadex) 01/18/13    Past Surgical History:  Procedure Laterality Date  . ABDOMINAL HYSTERECTOMY  04/2000   partial, fibroids  . 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  Marland Kitchen BREATH TEK H PYLORI  12/07/2011   Procedure: BREATH TEK H PYLORI;  Surgeon: Shann Medal, MD;  Location: [facility];  Service: Endoscopy;  Laterality: N/A;  . COLONOSCOPY    . Needle Core Biopsy  06/30/12   Left Breast: 12 O'clock/ Benign Parenchyma    Family Psychiatric History: See intake H&P for full details. Reviewed, with no updates at this time.   Family History:  Family History  Problem Relation Age of Onset  . Colon cancer Father 14  . Alcohol abuse  Father   . Colon polyps Brother   . Anxiety disorder Brother   . Depression Brother   . Breast cancer Sister 68  . Obesity Sister   . Anxiety disorder Sister   . Depression Sister   . Anxiety disorder Mother   . Depression Mother   . Bipolar disorder Mother   . Lymphoma Brother   . Drug abuse Brother   . Anxiety disorder Sister   . Depression Sister   . Bipolar disorder Sister   . Esophageal cancer Neg Hx   . Stomach cancer Neg Hx   . Rectal cancer Neg Hx     Social History:  Social History   Socioeconomic History  . Marital status: Divorced    Spouse name: Not on file  . Number of children: 1  . Years of education: Not on file  . Highest education level: Associate degree: occupational, Hotel manager, or vocational program  Occupational  History  . Occupation: Pharmacologist    CommentAstronomer  Social Needs  . Financial resource strain: Hard  . Food insecurity:    Worry: Often true    Inability: Often true  . Transportation needs:    Medical: No    Non-medical: No  Tobacco Use  . Smoking status: Never Smoker  . Smokeless tobacco: Never Used  Substance and Sexual Activity  . Alcohol use: No  . Drug use: No  . Sexual activity: Yes  Lifestyle  . Physical activity:    Days per week: 0 days    Minutes per session: 0 min  . Stress: Rather much  Relationships  . Social connections:    Talks on phone: Never    Gets together: Never    Attends religious service: Never    Active member of club or organization: No    Attends meetings of clubs or organizations: Never    Relationship status: Separated  Other Topics Concern  . Not on file  Social History Narrative   Married for 20+ years, divorced Dec 2017   Living on own       Allergies:  Allergies  Allergen Reactions  . Sulfa Antibiotics Shortness Of Breath and Swelling    Metabolic Disorder Labs: Lab Results  Component Value Date   HGBA1C 5.6 05/22/2018   No results found for: PROLACTIN Lab Results  Component Value Date   CHOL 209 (H) 05/22/2018   TRIG 265.0 (H) 05/22/2018   HDL 52.40 05/22/2018   CHOLHDL 4 05/22/2018   VLDL 53.0 (H) 05/22/2018   LDLCALC 90 01/01/2016   Lab Results  Component Value Date   TSH 1.66 11/21/2017    Therapeutic Level Labs: No results found for: LITHIUM No results found for: VALPROATE No components found for:  CBMZ  Current Medications: Current Outpatient Medications  Medication Sig Dispense Refill  . cyclobenzaprine (FLEXERIL) 10 MG tablet Take 1 tablet (10 mg total) by mouth 3 (three) times daily as needed for muscle spasms. 30 tablet 0  . hydrochlorothiazide (MICROZIDE) 12.5 MG capsule Take 1 capsule (12.5 mg total) by mouth daily. 30 capsule 11  . HYDROcodone-acetaminophen (NORCO/VICODIN) 5-325 MG tablet as  needed.  0  . hydrOXYzine (ATARAX/VISTARIL) 10 MG tablet Take 10 mg by mouth 2 (two) times daily. Patient is to not take Vistaril for two days and on the third day start Atarax 10 mg BID per Dr. Casimiro Needle.    . meloxicam (MOBIC) 15 MG tablet Take 1 tablet (15 mg total) by mouth daily. 30 tablet 2  .  omeprazole (PRILOSEC) 20 MG capsule Take 1 capsule (20 mg total) by mouth daily. 90 capsule 1  . sertraline (ZOLOFT) 100 MG tablet 1 and a half qam 45 tablet 6  . tamoxifen (NOLVADEX) 20 MG tablet Take 1 tablet (20 mg total) by mouth daily. 90 tablet 3  . venlafaxine XR (EFFEXOR XR) 150 MG 24 hr capsule Take 1 capsule (150 mg total) by mouth daily with breakfast. 90 capsule 0  . vitamin B-12 (CYANOCOBALAMIN) 1000 MCG tablet Take 1 tablet (1,000 mcg total) by mouth daily. 30 tablet 0   No current facility-administered medications for this visit.      Musculoskeletal: Strength & Muscle Tone: within normal limits Gait & Station: normal Patient leans: N/A  Psychiatric Specialty Exam: ROS  There were no vitals taken for this visit.There is no height or weight on file to calculate BMI.  General Appearance: Casual and Fairly Groomed  Eye Contact:  Good  Speech:  Normal Rate  Volume:  Decreased  Mood:  Depressed and Dysphoric  Affect:  Appropriate and Congruent  Thought Process:  Goal Directed and Descriptions of Associations: Intact  Orientation:  Full (Time, Place, and Person)  Thought Content: Logical   Suicidal Thoughts:  No  Homicidal Thoughts:  No  Memory:  Immediate;   Good  Judgement:  Good  Insight:  Good  Psychomotor Activity:  Normal  Concentration:  Concentration: Good  Recall:  Good  Fund of Knowledge: Good  Language: Good  Akathisia:  Negative  Handed:  Right  AIMS (if indicated): not done  Assets:  Housing Microbiologist Vocational/Educational  ADL's:  Intact  Cognition: WNL  Sleep:  Fair   Screenings: GAD-7     Office Visit from 04/22/2017 in  [facility]  Total GAD-7 Score  5    PHQ2-9     Office Visit from 04/22/2017 in [facility]  PHQ-2 Total Score  1  PHQ-9 Total Score  6      Assessment and Plan:  Today the patient has 1 major problem that of major depression.  Today we will increase her Zoloft from a dose of 100 mg 250 mg.  She will continue taking Vistaril as prescribed.  Today we will get her into therapy to see Luanne at our center.  Should be given a return appointment to see me in 6 weeks.  The patient is not suicidal.  She is functioning only fairly well.  She is a lot of pain.  She accepted our apology for scheduling error. Status of current problems: gradually improving  Labs Ordered: No orders of the defined types were placed in this encounter.   Labs Reviewed: n/a  Collateral Obtained/Records Reviewed: n/a  Plan:  Increase Effexor to 75 mg XR for 1 month, then increase to 150 mg XR daily Vistaril 25 mg TID prn for anxiety, sleep RTC 10-12 weeks follow up with Dr. Casimiro Needle Therapy referral Joslyn Devon in 3-4 weeks  Jerral Ralph, MD 04/25/2019, 4:30 PM

## 2019-04-27 DIAGNOSIS — H524 Presbyopia: Secondary | ICD-10-CM | POA: Diagnosis not present

## 2019-04-30 ENCOUNTER — Ambulatory Visit (HOSPITAL_COMMUNITY): Payer: BLUE CROSS/BLUE SHIELD | Admitting: Psychology

## 2019-05-10 MED FILL — HYDROCODON-APAP 10-325: 10-325 | 30 days supply | Qty: 60 | Fill #0

## 2019-05-16 ENCOUNTER — Telehealth: Payer: Self-pay | Admitting: Hematology and Oncology

## 2019-05-16 ENCOUNTER — Other Ambulatory Visit: Payer: Self-pay | Admitting: Internal Medicine

## 2019-05-16 MED FILL — TAMOXIFEN CITRATE 20 MG TAB: 20 | 30 days supply | Qty: 30 | Fill #11

## 2019-05-16 MED FILL — OMEPRAZOLE 20 MG CPDR: 20 | 30 days supply | Qty: 30 | Fill #5

## 2019-05-16 MED FILL — HYDROCHLOROTHIAZIDE 12.5 MG: 12.5 | 30 days supply | Qty: 30 | Fill #0

## 2019-05-16 NOTE — Telephone Encounter (Signed)
6/23 f/u converted to doximity video. Left message for patient.

## 2019-05-16 NOTE — Assessment & Plan Note (Signed)
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 hot flashes mostly resolved.  Breast Cancer Surveillance: 1. Breast exam: Annually 2. Mammogramdone at [facility]  08/08/2018 benign breast density category 8  Return to clinic in 1 year for follow-up

## 2019-05-21 ENCOUNTER — Telehealth: Payer: Self-pay | Admitting: Hematology and Oncology

## 2019-05-21 NOTE — Telephone Encounter (Signed)
Confirmed appt and verified info. °

## 2019-05-21 NOTE — Progress Notes (Signed)
HEMATOLOGY-ONCOLOGY DOXIMITY VISIT PROGRESS NOTE  I connected with Joann Page on 05/22/2019 at 11:15 AM EDT by Doximity video conference and verified that I am speaking with the correct person using two identifiers.  I discussed the limitations, risks, security and privacy concerns of performing an evaluation and management service by Doximity and the availability of in person appointments.  I also discussed with the patient that there may be a patient responsible charge related to this service. The patient expressed understanding and agreed to proceed.  Patient's Location: Home Physician Location: Clinic  CHIEF COMPLIANT: Follow-up of breast cancer on tamoxifen   INTERVAL HISTORY: Joann Page is a 58 y.o. female with above-mentioned history of left breast cancer treated with lumpectomy, adjuvant chemotherapy, and radiation. She is currently on anti-estrogen therapy with tamoxifen. I last saw her a year ago. Mammogram on 08/08/18 showed no evidence of malignancy bilaterally. She presents today over Doximity for annual follow-up. Lost 50lbs with diet and exercise  Oncology History  Breast cancer of lower-outer quadrant of left female breast ([place])  07/03/2012 Initial Diagnosis   Left: IDC, MRI 2.3 cm mass   07/17/2012 Surgery   Left Lumpectomy: 1.7 cm, IDC, High grade, 4 SN neg,  with HG DCIS with necrosis, Oncotype Dx Intermed grade   07/31/2012 - 11/23/2012 Chemotherapy   Adjuvant chemo TC x 4   11/30/2012 - 01/12/2013 Radiation Therapy   Adjuvant XRT   01/18/2013 -  Anti-estrogen oral therapy   Adjuvant  tamoxifen 10 years     REVIEW OF SYSTEMS:   Constitutional: Denies fevers, chills or abnormal weight loss Eyes: Denies blurriness of vision Ears, nose, mouth, throat, and face: Denies mucositis or sore throat Respiratory: Denies cough, dyspnea or wheezes Cardiovascular: Denies palpitation, chest discomfort Gastrointestinal:  Denies nausea, heartburn or change in bowel habits  Skin: Denies abnormal skin rashes Lymphatics: Denies new lymphadenopathy or easy bruising Neurological:Denies numbness, tingling or new weaknesses Behavioral/Psych: Mood is stable, no new changes  Extremities: No lower extremity edema Breast: denies any pain or lumps or nodules in either breasts All other systems were reviewed with the patient and are negative.  Observations/Objective:  There were no vitals filed for this visit. There is no height or weight on file to calculate BMI.  I have reviewed the data as listed CMP Latest Ref Rng & Units 05/22/2018 04/22/2017 05/11/2016  Glucose 70 - 99 mg/dL 97 92 89  BUN 6 - 23 mg/dL 9 10 9.6  Creatinine 0.40 - 1.20 mg/dL 0.71 0.74 0.8  Sodium 135 - 145 mEq/L 140 141 141  Potassium 3.5 - 5.1 mEq/L 4.0 4.6 4.5  Chloride 96 - 112 mEq/L 106 105 -  CO2 19 - 32 mEq/L 26 29 28   Calcium 8.4 - 10.5 mg/dL 8.8 9.4 9.5  Total Protein 6.0 - 8.3 g/dL 6.5 6.8 6.9  Total Bilirubin 0.2 - 1.2 mg/dL 0.3 0.3 <0.30  Alkaline Phos 39 - 117 U/L 41 40 45  AST 0 - 37 U/L 18 17 25   ALT 0 - 35 U/L 17 17 28     Lab Results  Component Value Date   WBC 6.8 05/22/2018   HGB 12.7 05/22/2018   HCT 38.1 05/22/2018   MCV 86.3 05/22/2018   PLT 188.0 05/22/2018   NEUTROABS 2.7 05/11/2016      Assessment Plan:  Breast cancer of lower-outer quadrant of left female breast [facility]) September 2013 Oncotype DX intermediate risk status post Taxotere Cytoxan x4 cycles followed by radiation therapy and  adjuvant tamoxifen started 01/18/2013  Tamoxifen toxicity evaluation: Patient had a previous hysterectomy and does not need Pap smears. hot flashes mostly resolved.  Breast Cancer Surveillance: 1. Breast exam: Annually 2. [facility]  08/08/2018 benign breast density category 8  Exercising and watching her diet: lost 50 lbs Return to clinic in 1 year for follow-up  I discussed the assessment and treatment plan with the patient. The patient was provided an  opportunity to ask questions and all were answered. The patient agreed with the plan and demonstrated an understanding of the instructions. The patient was advised to call back or seek an in-person evaluation if the symptoms worsen or if the condition fails to improve as anticipated.   I provided 15 minutes of face-to-face Doximity time during this encounter.    Rulon Eisenmenger, MD 05/22/2019   I, Molly Dorshimer, am acting as scribe for Nicholas Lose, MD.  I have reviewed the above documentation for accuracy and completeness, and I agree with the above.

## 2019-05-22 ENCOUNTER — Inpatient Hospital Stay: Payer: BC Managed Care – PPO | Attending: Hematology and Oncology | Admitting: Hematology and Oncology

## 2019-05-22 DIAGNOSIS — Z923 Personal history of irradiation: Secondary | ICD-10-CM | POA: Diagnosis not present

## 2019-05-22 DIAGNOSIS — C50512 Malignant neoplasm of lower-outer quadrant of left female breast: Secondary | ICD-10-CM | POA: Diagnosis not present

## 2019-05-22 DIAGNOSIS — Z170 Estrogen receptor positive status [ER+]: Secondary | ICD-10-CM

## 2019-05-22 DIAGNOSIS — Z79810 Long term (current) use of selective estrogen receptor modulators (SERMs): Secondary | ICD-10-CM | POA: Diagnosis not present

## 2019-05-22 MED ORDER — TAMOXIFEN CITRATE 20 MG PO TABS: 20.0000 mg | ORAL_TABLET | Freq: Every day | ORAL | 3 refills | Status: DC

## 2019-06-04 ENCOUNTER — Encounter: Payer: Self-pay | Admitting: Internal Medicine

## 2019-06-04 ENCOUNTER — Other Ambulatory Visit (INDEPENDENT_AMBULATORY_CARE_PROVIDER_SITE_OTHER): Payer: BC Managed Care – PPO

## 2019-06-04 ENCOUNTER — Ambulatory Visit (INDEPENDENT_AMBULATORY_CARE_PROVIDER_SITE_OTHER): Payer: BC Managed Care – PPO | Admitting: Internal Medicine

## 2019-06-04 ENCOUNTER — Other Ambulatory Visit: Payer: Self-pay

## 2019-06-04 ENCOUNTER — Encounter: Payer: BC Managed Care – PPO | Admitting: Internal Medicine

## 2019-06-04 VITALS — BP 136/90 | HR 80 | Temp 98.0°F | Ht 67.0 in | Wt 185.0 lb

## 2019-06-04 DIAGNOSIS — Z0000 Encounter for general adult medical examination without abnormal findings: Secondary | ICD-10-CM

## 2019-06-04 DIAGNOSIS — I10 Essential (primary) hypertension: Secondary | ICD-10-CM | POA: Diagnosis not present

## 2019-06-04 DIAGNOSIS — Z23 Encounter for immunization: Secondary | ICD-10-CM

## 2019-06-04 DIAGNOSIS — K219 Gastro-esophageal reflux disease without esophagitis: Secondary | ICD-10-CM

## 2019-06-04 LAB — MAGNESIUM: Magnesium: 1.8 mg/dL (ref 1.5–2.5)

## 2019-06-04 LAB — HEMOGLOBIN A1C: Hgb A1c MFr Bld: 5.6 % (ref 4.6–6.5)

## 2019-06-04 LAB — COMPREHENSIVE METABOLIC PANEL
ALT: 23 U/L (ref 0–35)
AST: 22 U/L (ref 0–37)
Albumin: 3.7 g/dL (ref 3.5–5.2)
Alkaline Phosphatase: 40 U/L (ref 39–117)
BUN: 6 mg/dL (ref 6–23)
CO2: 26 mEq/L (ref 19–32)
Calcium: 8.5 mg/dL (ref 8.4–10.5)
Chloride: 106 mEq/L (ref 96–112)
Creatinine, Ser: 0.69 mg/dL (ref 0.40–1.20)
GFR: 106.33 mL/min (ref >60.00)
Glucose, Bld: 113 mg/dL — ABNORMAL HIGH (ref 70–99)
Potassium: 3.1 mEq/L — ABNORMAL LOW (ref 3.5–5.1)
Sodium: 140 mEq/L (ref 135–145)
Total Bilirubin: 0.3 mg/dL (ref 0.2–1.2)
Total Protein: 6.4 g/dL (ref 6.0–8.3)

## 2019-06-04 LAB — LIPID PANEL
Cholesterol: 186 mg/dL (ref 0–200)
HDL: 62.6 mg/dL (ref >39.00)
LDL Cholesterol: 87 mg/dL (ref 0–99)
NonHDL: 123.42
Total CHOL/HDL Ratio: 3
Triglycerides: 184 mg/dL — ABNORMAL HIGH (ref 0.0–149.0)
VLDL: 36.8 mg/dL (ref 0.0–40.0)

## 2019-06-04 LAB — CBC
HCT: 38.8 % (ref 36.0–46.0)
Hemoglobin: 12.7 g/dL (ref 12.0–15.0)
MCHC: 32.8 g/dL (ref 30.0–36.0)
MCV: 89.6 fl (ref 78.0–100.0)
Platelets: 178 10*3/uL (ref 150.0–400.0)
RBC: 4.33 Mil/uL (ref 3.87–5.11)
RDW: 13.1 % (ref 11.5–15.5)
WBC: 4.3 10*3/uL (ref 4.0–10.5)

## 2019-06-04 LAB — VITAMIN B12: Vitamin B-12: 822 pg/mL (ref 211–911)

## 2019-06-04 LAB — VITAMIN D 25 HYDROXY (VIT D DEFICIENCY, FRACTURES): VITD: 33.76 ng/mL (ref 30.00–100.00)

## 2019-06-04 LAB — TSH: TSH: 0.62 u[IU]/mL (ref 0.35–4.50)

## 2019-06-04 NOTE — Patient Instructions (Signed)
Health Maintenance, Female Adopting a healthy lifestyle and getting preventive care are important in promoting health and wellness. Ask your health care provider about:  The right schedule for you to have regular tests and exams.  Things you can do on your own to prevent diseases and keep yourself healthy. What should I know about diet, weight, and exercise? Eat a healthy diet   Eat a diet that includes plenty of vegetables, fruits, low-fat dairy products, and lean protein.  Do not eat a lot of foods that are high in solid fats, added sugars, or sodium. Maintain a healthy weight Body mass index (BMI) is used to identify weight problems. It estimates body fat based on height and weight. Your health care provider can help determine your BMI and help you achieve or maintain a healthy weight. Get regular exercise Get regular exercise. This is one of the most important things you can do for your health. Most adults should:  Exercise for at least 150 minutes each week. The exercise should increase your heart rate and make you sweat (moderate-intensity exercise).  Do strengthening exercises at least twice a week. This is in addition to the moderate-intensity exercise.  Spend less time sitting. Even light physical activity can be beneficial. Watch cholesterol and blood lipids Have your blood tested for lipids and cholesterol at 56 years of age, then have this test every 5 years. Have your cholesterol levels checked more often if:  Your lipid or cholesterol levels are high.  You are older than 56 years of age.  You are at high risk for heart disease. What should I know about cancer screening? Depending on your health history and family history, you may need to have cancer screening at various ages. This may include screening for:  Breast cancer.  Cervical cancer.  Colorectal cancer.  Skin cancer.  Lung cancer. What should I know about heart disease, diabetes, and high blood  pressure? Blood pressure and heart disease  High blood pressure causes heart disease and increases the risk of stroke. This is more likely to develop in people who have high blood pressure readings, are of African descent, or are overweight.  Have your blood pressure checked: ? Every 3-5 years if you are 18-39 years of age. ? Every year if you are 40 years old or older. Diabetes Have regular diabetes screenings. This checks your fasting blood sugar level. Have the screening done:  Once every three years after age 40 if you are at a normal weight and have a low risk for diabetes.  More often and at a younger age if you are overweight or have a high risk for diabetes. What should I know about preventing infection? Hepatitis B If you have a higher risk for hepatitis B, you should be screened for this virus. Talk with your health care provider to find out if you are at risk for hepatitis B infection. Hepatitis C Testing is recommended for:  Everyone born from 1945 through 1965.  Anyone with known risk factors for hepatitis C. Sexually transmitted infections (STIs)  Get screened for STIs, including gonorrhea and chlamydia, if: ? You are sexually active and are younger than 56 years of age. ? You are older than 56 years of age and your health care provider tells you that you are at risk for this type of infection. ? Your sexual activity has changed since you were last screened, and you are at increased risk for chlamydia or gonorrhea. Ask your health care provider if   you are at risk.  Ask your health care provider about whether you are at high risk for HIV. Your health care provider may recommend a prescription medicine to help prevent HIV infection. If you choose to take medicine to prevent HIV, you should first get tested for HIV. You should then be tested every 3 months for as long as you are taking the medicine. Pregnancy  If you are about to stop having your period (premenopausal) and  you may become pregnant, seek counseling before you get pregnant.  Take 400 to 800 micrograms (mcg) of folic acid every day if you become pregnant.  Ask for birth control (contraception) if you want to prevent pregnancy. Osteoporosis and menopause Osteoporosis is a disease in which the bones lose minerals and strength with aging. This can result in bone fractures. If you are 65 years old or older, or if you are at risk for osteoporosis and fractures, ask your health care provider if you should:  Be screened for bone loss.  Take a calcium or vitamin D supplement to lower your risk of fractures.  Be given hormone replacement therapy (HRT) to treat symptoms of menopause. Follow these instructions at home: Lifestyle  Do not use any products that contain nicotine or tobacco, such as cigarettes, e-cigarettes, and chewing tobacco. If you need help quitting, ask your health care provider.  Do not use street drugs.  Do not share needles.  Ask your health care provider for help if you need support or information about quitting drugs. Alcohol use  Do not drink alcohol if: ? Your health care provider tells you not to drink. ? You are pregnant, may be pregnant, or are planning to become pregnant.  If you drink alcohol: ? Limit how much you use to 0-1 drink a day. ? Limit intake if you are breastfeeding.  Be aware of how much alcohol is in your drink. In the [place] one drink equals one 12 oz bottle of beer (355 mL), one 5 oz glass of wine (148 mL), or one 1 oz glass of hard liquor (44 mL). General instructions  Schedule regular health, dental, and eye exams.  Stay current with your vaccines.  Tell your health care provider if: ? You often feel depressed. ? You have ever been abused or do not feel safe at home. Summary  Adopting a healthy lifestyle and getting preventive care are important in promoting health and wellness.  Follow your health care provider's instructions about healthy  diet, exercising, and getting tested or screened for diseases.  Follow your health care provider's instructions on monitoring your cholesterol and blood pressure. This information is not intended to replace advice given to you by your health care provider. Make sure you discuss any questions you have with your health care provider. Document Released: 05/31/2011 Document Revised: 11/08/2018 Document Reviewed: 11/08/2018 Elsevier Patient Education  2020 Elsevier Inc.  

## 2019-06-04 NOTE — Assessment & Plan Note (Signed)
Taking omeprazole 20 mg daily and this is working well.

## 2019-06-04 NOTE — Progress Notes (Signed)
   Subjective:   Patient ID: Joann Page, female    DOB: December 12, 1960, 58 y.o.   MRN: [id number]  HPI The patient is a 58 YO female coming in for physical.   PMH, [place], social history reviewed and updated  Review of Systems  Constitutional: Negative.   HENT: Negative.   Eyes: Negative.   Respiratory: Negative for cough, chest tightness and shortness of breath.   Cardiovascular: Negative for chest pain, palpitations and leg swelling.  Gastrointestinal: Negative for abdominal distention, abdominal pain, constipation, diarrhea, nausea and vomiting.  Musculoskeletal: Negative.   Skin: Negative.   Neurological: Negative.   Psychiatric/Behavioral: Negative.     Objective:  Physical Exam Constitutional:      Appearance: She is well-developed.  HENT:     Head: Normocephalic and atraumatic.  Neck:     Musculoskeletal: Normal range of motion.  Cardiovascular:     Rate and Rhythm: Normal rate and regular rhythm.  Pulmonary:     Effort: Pulmonary effort is normal. No respiratory distress.     Breath sounds: Normal breath sounds. No wheezing or rales.  Abdominal:     General: Bowel sounds are normal. There is no distension.     Palpations: Abdomen is soft.     Tenderness: There is no abdominal tenderness. There is no rebound.  Skin:    General: Skin is warm and dry.  Neurological:     Mental Status: She is alert and oriented to person, place, and time.     Coordination: Coordination normal.     Vitals:   06/04/19 1514  BP: 136/90  Pulse: 80  Temp: 98 F (36.7 C)  TempSrc: Oral  SpO2: 99%  Weight: 185 lb (83.9 kg)  Height: 5\' 7"  (1.702 m)    Assessment & Plan:  Shingrix IM given at visit

## 2019-06-04 NOTE — Assessment & Plan Note (Signed)
Taking hctz and needs BMP, BP at goal.

## 2019-06-04 NOTE — Assessment & Plan Note (Signed)
Flu shot counseled. Shingrix given 1st today. Tetanus up to date. Colonoscopy up to date. Mammogram up to date, pap smear up to date. Counseled about sun safety and mole surveillance. Counseled about the dangers of distracted driving. Given 10 year screening recommendations.

## 2019-06-06 ENCOUNTER — Telehealth: Payer: Self-pay

## 2019-06-06 MED FILL — HYDROCODON-APAP 10-325: 10-325 | 30 days supply | Qty: 60 | Fill #0

## 2019-06-06 NOTE — Telephone Encounter (Signed)
Patient was informed of lab results

## 2019-06-06 NOTE — Telephone Encounter (Signed)
Copied from [place] [id number]. Topic: General - Call Back - No Documentation >> Jun 06, 2019  8:35 AM Sheran Luz wrote: Patient returning call to [facility]. Per FC, unavailable.

## 2019-06-18 MED FILL — TAMOXIFEN CITRATE 20 MG TAB: 20 | 30 days supply | Qty: 30 | Fill #0

## 2019-06-18 MED FILL — HYDROCHLOROTHIAZIDE 12.5 MG: 12.5 | 30 days supply | Qty: 30 | Fill #1

## 2019-06-27 ENCOUNTER — Ambulatory Visit (INDEPENDENT_AMBULATORY_CARE_PROVIDER_SITE_OTHER): Payer: BC Managed Care – PPO | Admitting: Psychiatry

## 2019-06-27 ENCOUNTER — Other Ambulatory Visit: Payer: Self-pay

## 2019-06-27 DIAGNOSIS — F339 Major depressive disorder, recurrent, unspecified: Secondary | ICD-10-CM | POA: Diagnosis not present

## 2019-06-27 MED ORDER — HYDROXYZINE PAMOATE 25 MG PO CAPS: ORAL_CAPSULE | ORAL | 3 refills | Status: DC

## 2019-06-27 MED ORDER — SERTRALINE HCL 100 MG PO TABS: ORAL_TABLET | ORAL | 3 refills | Status: DC

## 2019-06-27 MED FILL — SERTRALINE HCL 100 MG TAB: 100 | 30 days supply | Qty: 60 | Fill #0

## 2019-06-27 MED FILL — HYDROXYZINE PAMOATE 25 MG C: 25 | 30 days supply | Qty: 90 | Fill #0

## 2019-06-27 NOTE — Progress Notes (Signed)
[place] MD/PA/NP OP Progress Note  06/27/2019 4:32 PM Joann Page  MRN:  [id number]  Chief Complaint: med check HPI: Joann Page Visit Diagnosis: Major depression recurrent Today the patient is not doing that well.  She seems quite depressed.  She says the Zoloft has been helpful but she is not near her baseline.  She is sleeping erratically.  She has no appetite.  She does not enjoy reading or TV.  The patient denies any use of alcohol or drugs.  She is still very afflicted by the fact that her daughter has come out and is gay.  Her daughter is moved on to graduate school in [place].  The patient is been have a hard time starting into therapy.  She does not like being on a visual system.  She to be on the phone.  She takes Vistaril erratically.  She says when she feels very anxious she takes it and it is helpful.  The patient is energy level is somewhat low.  Her next visit we will do a more thorough evaluation but this patient seems to be quite depressed.  I do not believe she is suicidal and she actually functions fairly well.  Her mood is somewhat improved as we have increased the Zoloft.  Past Psychiatric History: See intake H&P for full details. Reviewed, with no updates at this time.  Past Medical History:  Past Medical History:  Diagnosis Date  . Anxiety   . 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 Breast cancer ([place]) 07/17/12   at age 19, ER/PR +, hER 2 -  . Cancer ([place]) 07/07/12   Left Breast Inv Ductal Ca.  . Degenerative disk disease 2010   Lumbar spine  . Depression   . GERD (gastroesophageal reflux disease)    hx  . H/O hiatal hernia    small  . Hyperlipidemia   . Knee pain, right   . No pertinent past medical history   . Obesity   . S/P radiation therapy 11/30/12 - 01/12/13   Left Breast / 50 gray in 25 Fractions with boost 10 gray in 5 Fractions  . Status post chemotherapy comp 10/27/12    4 CyclesTaxotere/ Cytoxan  . Status post chemotherapy Comp. 01/12/13   neoadjuvant  chemotherapy: 4 Cycles of Taxotere/Cytoxan  . Use of tamoxifen (Nolvadex) 01/18/13    Past Surgical History:  Procedure Laterality Date  . ABDOMINAL HYSTERECTOMY  04/2000   partial, fibroids  . 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  Marland Kitchen BREATH TEK H PYLORI  12/07/2011   Procedure: BREATH TEK H PYLORI;  Surgeon: Shann Medal, MD;  Location: [facility];  Service: Endoscopy;  Laterality: N/A;  . COLONOSCOPY    . Needle Core Biopsy  06/30/12   Left Breast: 12 O'clock/ Benign Parenchyma    Family Psychiatric History: See intake H&P for full details. Reviewed, with no updates at this time.   Family History:  Family History  Problem Relation Age of Onset  . Colon cancer Father 60  . Alcohol abuse Father   . Colon polyps Brother   . Anxiety disorder Brother   . Depression Brother   . Breast cancer Sister 58  . Obesity Sister   . Anxiety disorder Sister   .  Depression Sister   . Anxiety disorder Mother   . Depression Mother   . Bipolar disorder Mother   . Lymphoma Brother   . Drug abuse Brother   . Anxiety disorder Sister   . Depression Sister   . Bipolar disorder Sister   . Esophageal cancer Neg Hx   . Stomach cancer Neg Hx   . Rectal cancer Neg Hx     Social History:  Social History   Socioeconomic History  . Marital status: Divorced    Spouse name: Not on file  . Number of children: 1  . Years of education: Not on file  . Highest education level: Associate degree: occupational, Hotel manager, or vocational program  Occupational History  . Occupation: Pharmacologist    CommentAstronomer  Social Needs  . Financial resource strain: Hard  . Food insecurity    Worry: Often true    Inability: Often true  . Transportation needs    Medical: No     Non-medical: No  Tobacco Use  . Smoking status: Never Smoker  . Smokeless tobacco: Never Used  Substance and Sexual Activity  . Alcohol use: No  . Drug use: No  . Sexual activity: Yes  Lifestyle  . Physical activity    Days per week: 0 days    Minutes per session: 0 min  . Stress: Rather much  Relationships  . Social Herbalist on phone: Never    Gets together: Never    Attends religious service: Never    Active member of club or organization: No    Attends meetings of clubs or organizations: Never    Relationship status: Separated  Other Topics Concern  . Not on file  Social History Narrative   Married for 20+ years, divorced Dec 2017   Living on own       Allergies:  Allergies  Allergen Reactions  . Sulfa Antibiotics Shortness Of Breath and Swelling    Metabolic Disorder Labs: Lab Results  Component Value Date   HGBA1C 5.6 06/04/2019   No results found for: PROLACTIN Lab Results  Component Value Date   CHOL 186 06/04/2019   TRIG 184.0 (H) 06/04/2019   HDL 62.60 06/04/2019   CHOLHDL 3 06/04/2019   VLDL 36.8 06/04/2019   LDLCALC 87 06/04/2019   LDLCALC 90 01/01/2016   Lab Results  Component Value Date   TSH 0.62 06/04/2019   TSH 1.66 11/21/2017    Therapeutic Level Labs: No results found for: LITHIUM No results found for: VALPROATE No components found for:  CBMZ  Current Medications: Current Outpatient Medications  Medication Sig Dispense Refill  . cyclobenzaprine (FLEXERIL) 10 MG tablet Take 1 tablet (10 mg total) by mouth 3 (three) times daily as needed for muscle spasms. 30 tablet 0  . hydrochlorothiazide (MICROZIDE) 12.5 MG capsule Take 1 capsule (12.5 mg total) by mouth daily. Keep July visit. 30 capsule 1  . hydrOXYzine (VISTARIL) 25 MG capsule 2  qhs   1  Prn  q day 90 capsule 3  . meloxicam (MOBIC) 15 MG tablet Take 1 tablet (15 mg total) by mouth daily. 30 tablet 2  . omeprazole (PRILOSEC) 20 MG capsule Take 1 capsule (20 mg  total) by mouth daily. 90 capsule 1  . sertraline (ZOLOFT) 100 MG tablet 2  qam 60 tablet 3  . tamoxifen (NOLVADEX) 20 MG tablet Take 1 tablet (20 mg total) by mouth daily. 90 tablet 3  . vitamin B-12 (CYANOCOBALAMIN) 1000 MCG tablet  Take 1 tablet (1,000 mcg total) by mouth daily. 30 tablet 0   No current facility-administered medications for this visit.      Musculoskeletal: Strength & Muscle Tone: within normal limits Gait & Station: normal Patient leans: N/A  Psychiatric Specialty Exam: ROS  There were no vitals taken for this visit.There is no height or weight on file to calculate BMI.  General Appearance: Casual and Fairly Groomed  Eye Contact:  Good  Speech:  Normal Rate  Volume:  Decreased  Mood:  Depressed and Dysphoric  Affect:  Appropriate and Congruent  Thought Process:  Goal Directed and Descriptions of Associations: Intact  Orientation:  Full (Time, Place, and Person)  Thought Content: Logical   Suicidal Thoughts:  No  Homicidal Thoughts:  No  Memory:  Immediate;   Good  Judgement:  Good  Insight:  Good  Psychomotor Activity:  Normal  Concentration:  Concentration: Good  Recall:  Good  Fund of Knowledge: Good  Language: Good  Akathisia:  Negative  Handed:  Right  AIMS (if indicated): not done  Assets:  Housing Microbiologist Vocational/Educational  ADL's:  Intact  Cognition: WNL  Sleep:  Fair   Screenings: GAD-7     Office Visit from 06/04/2019 in [facility] Office Visit from 04/22/2017 in [facility]  Total GAD-7 Score  16  5    PHQ2-9     Office Visit from 06/04/2019 in [facility] [place] [facility] from 04/22/2017 in [facility]  PHQ-2 Total Score  3  1  PHQ-9 Total Score  7  6      Assessment and Plan:   At this time the patient's major problem is major depression.  At this time we will begin her on the highest dose of Zoloft 200  mg.  We will also tell her that she should take Vistaril 25 mg 2 of them every night and 1 as needed for anxiety.  Her next visit we will spend more time trying to clarify her psychosocial stressors.  She is very concerned about the virus and that is stressing her out a great deal.  We will make contact with her in 6 weeks.  She is not suicidal. Status of current problems: gradually improving  Labs Ordered: No orders of the defined types were placed in this encounter.   Labs Reviewed: n/a  Collateral Obtained/Records Reviewed: n/a  Plan:  Increase Effexor to 75 mg XR for 1 month, then increase to 150 mg XR daily Vistaril 25 mg TID prn for anxiety, sleep RTC 10-12 weeks follow up with Dr. Casimiro Needle Therapy referral Joslyn Devon in 3-4 weeks  Jerral Ralph, MD 06/27/2019, 4:32 PM

## 2019-06-29 ENCOUNTER — Other Ambulatory Visit: Payer: Self-pay | Admitting: Internal Medicine

## 2019-06-29 MED FILL — OMEPRAZOLE 20 MG CAP: 20 | 30 days supply | Qty: 30 | Fill #0

## 2019-07-05 MED FILL — HYDROCODON-APAP 10-325: 10-325 | 30 days supply | Qty: 60 | Fill #0

## 2019-07-05 MED FILL — METHOCARBAMOL 750 MG TABS: 750 | 30 days supply | Qty: 90 | Fill #0

## 2019-07-05 MED FILL — HYDROXYZINE PAMOATE 25 MG C: 25 | 30 days supply | Qty: 90 | Fill #0

## 2019-07-05 MED FILL — SERTRALINE HCL 100 MG TAB: 100 | 30 days supply | Qty: 60 | Fill #0

## 2019-07-06 ENCOUNTER — Telehealth: Payer: Self-pay

## 2019-07-06 NOTE — Telephone Encounter (Signed)
Erroneous entry

## 2019-07-18 MED FILL — TAMOXIFEN CITRATE 20 MG TAB: 20 | 30 days supply | Qty: 30 | Fill #1

## 2019-07-18 MED FILL — HYDROCHLOROTHIAZIDE 12.5 MG: 12.5 | 30 days supply | Qty: 30 | Fill #0

## 2019-07-24 ENCOUNTER — Ambulatory Visit (INDEPENDENT_AMBULATORY_CARE_PROVIDER_SITE_OTHER): Payer: BC Managed Care – PPO | Admitting: Psychiatry

## 2019-07-24 ENCOUNTER — Other Ambulatory Visit: Payer: Self-pay

## 2019-07-24 DIAGNOSIS — F322 Major depressive disorder, single episode, severe without psychotic features: Secondary | ICD-10-CM | POA: Diagnosis not present

## 2019-07-24 NOTE — Progress Notes (Signed)
[facility] MD/PA/NP OP Progress Note  07/24/2019 3:29 PM Joann Page  MRN:  [id number]  Chief Complaint: med check HPI: Joann Page Diagnosis; major depression  At this time the patient is doing just a bit better.  He is only been 3 or 4 weeks since she increased the Zoloft and she started to feel improved.  She is sleeping better as well.  She was instructed to take Vistaril 2 at night and it seems to have helped a bit.  She is eating okay.  She does not drink alcohol or use drugs.  She has no evidence of psychosis.  She is a lot of frustration with her family.  Today our recommendation was to consider getting a rescue dog.  Overall patient is functioning only fairly well.  She seems pessimistic and withdrawn.  At this time we will ask her to continue taking her Zoloft at this highest dose and continue the Vistaril as ordered.  Past Psychiatric History: See intake H&P for full details. Reviewed, with no updates at this time.  Past Medical History:  Past Medical History:  Diagnosis Date  . Anxiety   . 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 Breast cancer ([place]) 07/17/12   at age 79, ER/PR +, hER 2 -  . Cancer ([place]) 07/07/12   Left Breast Inv Ductal Ca.  . Degenerative disk disease 2010   Lumbar spine  . Depression   . GERD (gastroesophageal reflux disease)    hx  . H/O hiatal hernia    small  . Hyperlipidemia   . Knee pain, right   . No pertinent past medical history   . Obesity   . S/P radiation therapy 11/30/12 - 01/12/13   Left Breast / 50 gray in 25 Fractions with boost 10 gray in 5 Fractions  . Status post chemotherapy comp 10/27/12    4 CyclesTaxotere/ Cytoxan  . Status post chemotherapy Comp. 01/12/13   neoadjuvant chemotherapy: 4 Cycles of Taxotere/Cytoxan  . Use of tamoxifen (Nolvadex) 01/18/13    Past Surgical History:  Procedure Laterality Date  . ABDOMINAL HYSTERECTOMY  04/2000   partial, fibroids  . 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  Marland Kitchen BREATH TEK H PYLORI  12/07/2011   Procedure: BREATH TEK H PYLORI;  Surgeon: Shann Medal, MD;  Location: [facility];  Service: Endoscopy;  Laterality: N/A;  . COLONOSCOPY    . Needle Core Biopsy  06/30/12   Left Breast: 12 O'clock/ Benign Parenchyma    Family Psychiatric History: See intake H&P for full details. Reviewed, with no updates at this time.   Family History:  Family History  Problem Relation Age of Onset  . Colon cancer Father 73  . Alcohol abuse Father   . Colon polyps Brother   . Anxiety disorder Brother   . Depression Brother   . Breast cancer Sister 97  . Obesity Sister   . Anxiety disorder Sister   . Depression Sister   . Anxiety disorder Mother   . Depression Mother   . Bipolar disorder Mother   . Lymphoma Brother   . Drug abuse Brother   . Anxiety disorder Sister   . Depression Sister   . Bipolar disorder Sister   . Esophageal cancer Neg Hx   .  Stomach cancer Neg Hx   . Rectal cancer Neg Hx     Social History:  Social History   Socioeconomic History  . Marital status: Divorced    Spouse name: Not on file  . Number of children: 1  . Years of education: Not on file  . Highest education level: Associate degree: occupational, Hotel manager, or vocational program  Occupational History  . Occupation: Pharmacologist    CommentAstronomer  Social Needs  . Financial resource strain: Hard  . Food insecurity    Worry: Often true    Inability: Often true  . Transportation needs    Medical: No    Non-medical: No  Tobacco Use  . Smoking status: Never Smoker  . Smokeless tobacco: Never Used  Substance and Sexual Activity  . Alcohol use: No  . Drug use: No  . Sexual activity: Yes  Lifestyle  . Physical activity    Days per week: 0 days    Minutes per  session: 0 min  . Stress: Rather much  Relationships  . Social Herbalist on phone: Never    Gets together: Never    Attends religious service: Never    Active member of club or organization: No    Attends meetings of clubs or organizations: Never    Relationship status: Separated  Other Topics Concern  . Not on file  Social History Narrative   Married for 20+ years, divorced Dec 2017   Living on own       Allergies:  Allergies  Allergen Reactions  . Sulfa Antibiotics Shortness Of Breath and Swelling    Metabolic Disorder Labs: Lab Results  Component Value Date   HGBA1C 5.6 06/04/2019   No results found for: PROLACTIN Lab Results  Component Value Date   CHOL 186 06/04/2019   TRIG 184.0 (H) 06/04/2019   HDL 62.60 06/04/2019   CHOLHDL 3 06/04/2019   VLDL 36.8 06/04/2019   LDLCALC 87 06/04/2019   LDLCALC 90 01/01/2016   Lab Results  Component Value Date   TSH 0.62 06/04/2019   TSH 1.66 11/21/2017    Therapeutic Level Labs: No results found for: LITHIUM No results found for: VALPROATE No components found for:  CBMZ  Current Medications: Current Outpatient Medications  Medication Sig Dispense Refill  . cyclobenzaprine (FLEXERIL) 10 MG tablet Take 1 tablet (10 mg total) by mouth 3 (three) times daily as needed for muscle spasms. 30 tablet 0  . hydrochlorothiazide (MICROZIDE) 12.5 MG capsule TAKE 1 CAPSULE BY MOUTH ONCE DAILY (KEEP JULY VISIT) 90 capsule 1  . hydrOXYzine (VISTARIL) 25 MG capsule 2  qhs   1  Prn  q day 90 capsule 3  . meloxicam (MOBIC) 15 MG tablet Take 1 tablet (15 mg total) by mouth daily. 30 tablet 2  . omeprazole (PRILOSEC) 20 MG capsule TAKE 1 CAPSULE (20 MG TOTAL) BY MOUTH DAILY. 90 capsule 1  . sertraline (ZOLOFT) 100 MG tablet 2  qam 60 tablet 3  . tamoxifen (NOLVADEX) 20 MG tablet Take 1 tablet (20 mg total) by mouth daily. 90 tablet 3  . vitamin B-12 (CYANOCOBALAMIN) 1000 MCG tablet Take 1 tablet (1,000 mcg total) by mouth  daily. 30 tablet 0   No current facility-administered medications for this visit.      Musculoskeletal: Strength & Muscle Tone: within normal limits Gait & Station: normal Patient leans: N/A  Psychiatric Specialty Exam: ROS  There were no vitals taken for this visit.There is no height or  weight on file to calculate BMI.  General Appearance: Casual and Fairly Groomed  Eye Contact:  Good  Speech:  Normal Rate  Volume:  Decreased  Mood:  Depressed and Dysphoric  Affect:  Appropriate and Congruent  Thought Process:  Goal Directed and Descriptions of Associations: Intact  Orientation:  Full (Time, Place, and Person)  Thought Content: Logical   Suicidal Thoughts:  No  Homicidal Thoughts:  No  Memory:  Immediate;   Good  Judgement:  Good  Insight:  Good  Psychomotor Activity:  Normal  Concentration:  Concentration: Good  Recall:  Good  Fund of Knowledge: Good  Language: Good  Akathisia:  Negative  Handed:  Right  AIMS (if indicated): not done  Assets:  Housing Microbiologist Vocational/Educational  ADL's:  Intact  Cognition: WNL  Sleep:  Fair   Screenings: GAD-7     Office Visit from 06/04/2019 in [facility] Office Visit from 04/22/2017 in [facility]  Total GAD-7 Score  16  5    PHQ2-9     Office Visit from 06/04/2019 in [facility] from 04/22/2017 in [facility]  PHQ-2 Total Score  3  1  PHQ-9 Total Score  7  6      Assessment and Plan:  Today the patient will continue taking Zoloft 200 mg.  Her first a major problem is major depression.  Her second problem is that of insomnia.  She will continue taking Vistaril 25 mg 2 at night.  This patient will be reevaluated in 9 weeks.  The patient is functioning only fairly well.  She is not suicidal. Status of current problems: gradually improving  Labs Ordered: No orders of the defined types  were placed in this encounter.   Labs Reviewed: n/a  Collateral Obtained/Records Reviewed: n/a  Plan:  Increase Effexor to 75 mg XR for 1 month, then increase to 150 mg XR daily Vistaril 25 mg TID prn for anxiety, sleep RTC 10-12 weeks follow up with Dr. Casimiro Needle Therapy referral Joslyn Devon in 3-4 weeks  Jerral Ralph, MD 07/24/2019, 3:29 PM

## 2019-08-02 MED FILL — HYDROCODON-APAP 10-325: 10-325 | 30 days supply | Qty: 60 | Fill #0

## 2019-08-03 ENCOUNTER — Ambulatory Visit (INDEPENDENT_AMBULATORY_CARE_PROVIDER_SITE_OTHER): Payer: BC Managed Care – PPO

## 2019-08-03 DIAGNOSIS — Z23 Encounter for immunization: Secondary | ICD-10-CM

## 2019-08-03 DIAGNOSIS — Z299 Encounter for prophylactic measures, unspecified: Secondary | ICD-10-CM

## 2019-08-09 DIAGNOSIS — Z853 Personal history of malignant neoplasm of breast: Secondary | ICD-10-CM | POA: Diagnosis not present

## 2019-08-13 MED FILL — TAMOXIFEN 20 MG TABLET: 20 | 30 days supply | Qty: 30 | Fill #2

## 2019-08-13 MED FILL — HYDROCHLOROTHIAZIDE 12.5 MG: 12.5 | 30 days supply | Qty: 30 | Fill #1

## 2019-08-13 MED FILL — OMEPRAZOLE 20 MG CAP: 20 | 30 days supply | Qty: 30 | Fill #1

## 2019-08-20 ENCOUNTER — Encounter (HOSPITAL_COMMUNITY): Payer: Self-pay | Admitting: Psychology

## 2019-08-20 NOTE — Progress Notes (Signed)
Joann Page is a 58 y.o. female patient who is discharged from counseling as pt lack of f/u w/ counseling.  Outpatient Therapist Discharge Summary  Joann Page    01/30/1961   Admission Date: 08/09/18   Discharge Date:  08/20/19 Reason for Discharge: didn't return Diagnosis:  MDD Comments:  Pt cancelled f/u after initial assessment, pt referred back to tx by Dr. Marjie Skiff. Pt was no showed and then cancelled another.    [facility], [facility]

## 2019-08-29 MED FILL — HYDROCODON-APAP 10-325: 10-325 | 60 days supply | Qty: 60 | Fill #0

## 2019-09-10 MED FILL — TAMOXIFEN 20 MG TABLET: 20 | 30 days supply | Qty: 30 | Fill #3

## 2019-09-10 MED FILL — OMEPRAZOLE 20 MG CAP: 20 | 30 days supply | Qty: 30 | Fill #2

## 2019-09-10 MED FILL — HYDROCHLOROTHIAZIDE 12.5 MG: 12.5 | 30 days supply | Qty: 30 | Fill #2

## 2019-09-25 DIAGNOSIS — M47816 Spondylosis without myelopathy or radiculopathy, lumbar region: Secondary | ICD-10-CM | POA: Diagnosis not present

## 2019-09-27 MED FILL — HYDROCODON-APAP 10-325: 10-325 | 30 days supply | Qty: 60 | Fill #0

## 2019-09-28 ENCOUNTER — Ambulatory Visit (HOSPITAL_COMMUNITY): Payer: BC Managed Care – PPO | Admitting: Psychiatry

## 2019-09-28 ENCOUNTER — Other Ambulatory Visit: Payer: Self-pay

## 2019-10-09 DIAGNOSIS — M47816 Spondylosis without myelopathy or radiculopathy, lumbar region: Secondary | ICD-10-CM | POA: Diagnosis not present

## 2019-10-16 MED FILL — TAMOXIFEN 20 MG TABLET: 20 | 30 days supply | Qty: 30 | Fill #4

## 2019-10-16 MED FILL — OMEPRAZOLE 20 MG CAP: 20 | 30 days supply | Qty: 30 | Fill #3

## 2019-10-16 MED FILL — HYDROCHLOROTHIAZIDE 12.5 MG: 12.5 | 30 days supply | Qty: 30 | Fill #3

## 2019-10-18 ENCOUNTER — Ambulatory Visit (HOSPITAL_COMMUNITY): Payer: BC Managed Care – PPO | Admitting: Psychiatry

## 2019-10-26 MED FILL — HYDROCODON-APAP 10-325: 10-325 | 30 days supply | Qty: 60 | Fill #0

## 2019-10-31 DIAGNOSIS — M545 Low back pain: Secondary | ICD-10-CM | POA: Diagnosis not present

## 2019-10-31 MED FILL — CYCLOBENZAPRINE HCL 10 MG T: 10 | 30 days supply | Qty: 90 | Fill #0

## 2019-11-15 MED FILL — TAMOXIFEN 20 MG TABLET: 20 | 30 days supply | Qty: 30 | Fill #5

## 2019-11-15 MED FILL — HYDROCHLOROTHIAZIDE 12.5 MG: 12.5 | 30 days supply | Qty: 30 | Fill #4

## 2019-11-15 MED FILL — OMEPRAZOLE 20 MG CAP: 20 | 30 days supply | Qty: 30 | Fill #4

## 2019-11-16 ENCOUNTER — Other Ambulatory Visit: Payer: Self-pay

## 2019-11-16 ENCOUNTER — Ambulatory Visit (INDEPENDENT_AMBULATORY_CARE_PROVIDER_SITE_OTHER): Payer: BC Managed Care – PPO | Admitting: Psychiatry

## 2019-11-16 DIAGNOSIS — F339 Major depressive disorder, recurrent, unspecified: Secondary | ICD-10-CM

## 2019-11-16 MED ORDER — HYDROXYZINE PAMOATE 25 MG PO CAPS: ORAL_CAPSULE | ORAL | 3 refills | Status: DC

## 2019-11-16 MED ORDER — SERTRALINE HCL 100 MG PO TABS: ORAL_TABLET | ORAL | 3 refills | Status: DC

## 2019-11-16 MED FILL — HYDROXYZINE PAMOATE 25 MG C: 25 | 30 days supply | Qty: 90 | Fill #0

## 2019-11-16 MED FILL — SERTRALINE HCL 100 MG TAB: 100 | 30 days supply | Qty: 60 | Fill #0

## 2019-11-16 NOTE — Progress Notes (Signed)
[facility] MD/PA/NP OP Progress Note  11/16/2019 10:28 AM Joann Page  MRN:  [id number]  Chief Complaint: med check HPI: Joann Page Diagnosis; major depression  Today the patient is doing only fairly well.  She misunderstood and thought that I was her therapist.  Therefore she assumed I would know all about her social circumstances and her family.  When I did state to her how her daughter was her her however family was and he asked that her if she had any children she became upset.  She was concerned that I was not listening to her.  I reviewed the note 2 times ago which describes a daughter who is a Writer school in [place].  In my last note it did seem to be very relevant.  The patient was doing fairly well.  She had no real complaints.  Today again she says she is sleeping well taking 2 Vistaril's at night and taking the maximum dose of Zoloft.  She had difficulty actually engaging in the setting.  She was sent to the IOP program a year or so ago but could not sit still because of back pain.  Now her back pain is better.  The patient has a very sedentary lifestyle.  She cannot describe what she does for enjoyment.  My last visit with her I recommended that she think about getting a rescue dog.  She never did that.  This time we will strongly recommend that she begin in therapy.  I clarified what my role was as her psychiatrist not as her psychotherapist.  Patient says she could not afford to get a rescue dog.  She is unemployed she is not on disability sure she is financially limited.  She makes contact with the niece who is her age group about every few days.  She also has contact with her daughter in [place].  Past Psychiatric History: See intake H&P for full details. Reviewed, with no updates at this time.  Past Medical History:  Past Medical History:  Diagnosis Date  . Anxiety   . 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 Breast cancer ([facility]) 07/17/12   at age 74, ER/PR +, hER 2 -  . Cancer  ([facility]) 07/07/12   Left Breast Inv Ductal Ca.  . Degenerative disk disease 2010   Lumbar spine  . Depression   . GERD (gastroesophageal reflux disease)    hx  . H/O hiatal hernia    small  . Hyperlipidemia   . Knee pain, right   . No pertinent past medical history   . Obesity   . S/P radiation therapy 11/30/12 - 01/12/13   Left Breast / 50 gray in 25 Fractions with boost 10 gray in 5 Fractions  . Status post chemotherapy comp 10/27/12    4 CyclesTaxotere/ Cytoxan  . Status post chemotherapy Comp. 01/12/13   neoadjuvant chemotherapy: 4 Cycles of Taxotere/Cytoxan  . Use of tamoxifen (Nolvadex) 01/18/13    Past Surgical History:  Procedure Laterality Date  . ABDOMINAL HYSTERECTOMY  04/2000   partial, fibroids  . 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  Marland Kitchen BREATH TEK H PYLORI  12/07/2011   Procedure: BREATH TEK H PYLORI;  Surgeon: Shann Medal, MD;  Location: [facility];  Service: Endoscopy;  Laterality: N/A;  . COLONOSCOPY    . Needle Core Biopsy  06/30/12   Left Breast: 12 O'clock/ Benign Parenchyma    Family Psychiatric History: See intake H&P for full details. Reviewed, with no updates at this time.   Family History:  Family History  Problem Relation Age of Onset  . Colon cancer Father 61  . Alcohol abuse Father   . Colon polyps Brother   . Anxiety disorder Brother   . Depression Brother   . Breast cancer Sister 67  . Obesity Sister   . Anxiety disorder Sister   . Depression Sister   . Anxiety disorder Mother   . Depression Mother   . Bipolar disorder Mother   . Lymphoma Brother   . Drug abuse Brother   . Anxiety disorder Sister   . Depression Sister   . Bipolar disorder Sister   . Esophageal cancer Neg Hx   . Stomach cancer Neg Hx   .  Rectal cancer Neg Hx     Social History:  Social History   Socioeconomic History  . Marital status: Divorced    Spouse name: Not on file  . Number of children: 1  . Years of education: Not on file  . Highest education level: Associate degree: occupational, Hotel manager, or vocational program  Occupational History  . Occupation: Pharmacologist    CommentAstronomer  Tobacco Use  . Smoking status: Never Smoker  . Smokeless tobacco: Never Used  Substance and Sexual Activity  . Alcohol use: No  . Drug use: No  . Sexual activity: Yes  Other Topics Concern  . Not on file  Social History Narrative   Married for 20+ years, divorced Dec 2017   Living on own      Social Determinants of Health   Financial Resource Strain:   . Difficulty of Paying Living Expenses: Not on file  Food Insecurity:   . Worried About Charity fundraiser in the Last Year: Not on file  . Ran Out of Food in the Last Year: Not on file  Transportation Needs:   . Lack of Transportation (Medical): Not on file  . Lack of Transportation (Non-Medical): Not on file  Physical Activity:   . Days of Exercise per Week: Not on file  . Minutes of Exercise per Session: Not on file  Stress:   . Feeling of Stress : Not on file  Social Connections:   . Frequency of Communication with Friends and Family: Not on file  . Frequency of Social Gatherings with Friends and Family: Not on file  . Attends Religious Services: Not on file  . Active Member of Clubs or Organizations: Not on file  . Attends Archivist Meetings: Not on file  . Marital Status: Not on file    Allergies:  Allergies  Allergen Reactions  . Sulfa Antibiotics Shortness Of Breath and Swelling    Metabolic Disorder Labs: Lab Results  Component Value Date   HGBA1C 5.6 06/04/2019   No results found for: PROLACTIN Lab Results  Component Value Date   CHOL 186 06/04/2019   TRIG 184.0 (H) 06/04/2019   HDL 62.60 06/04/2019   CHOLHDL 3 06/04/2019    VLDL 36.8 06/04/2019   LDLCALC 87 06/04/2019   LDLCALC 90 01/01/2016   Lab Results  Component Value Date   TSH 0.62 06/04/2019   TSH 1.66 11/21/2017    Therapeutic Level Labs: No results found for: LITHIUM No results found for: VALPROATE No components found for:  CBMZ  Current Medications: Current Outpatient Medications  Medication Sig Dispense Refill  . cyclobenzaprine (FLEXERIL) 10 MG tablet Take 1 tablet (10 mg total) by mouth 3 (three) times daily as needed for muscle spasms. 30 tablet 0  . hydrochlorothiazide (MICROZIDE) 12.5 MG capsule TAKE 1 CAPSULE BY MOUTH ONCE DAILY (KEEP JULY VISIT) 90 capsule 1  . hydrOXYzine (VISTARIL) 25 MG capsule 2  qhs   1  Prn  q day 90 capsule 3  . meloxicam (MOBIC) 15 MG tablet Take 1 tablet (15 mg total) by mouth daily. 30 tablet 2  . omeprazole (PRILOSEC) 20 MG capsule TAKE 1 CAPSULE (20 MG TOTAL) BY MOUTH DAILY. 90 capsule 1  . sertraline (ZOLOFT) 100 MG tablet 2  qam 60 tablet 3  . tamoxifen (NOLVADEX) 20 MG tablet Take 1 tablet (20 mg total) by mouth daily. 90 tablet 3  . vitamin B-12 (CYANOCOBALAMIN) 1000 MCG tablet Take 1 tablet (1,000 mcg total) by mouth daily. 30 tablet 0   No current facility-administered medications for this visit.     Musculoskeletal: Strength & Muscle Tone: within normal limits Gait & Station: normal Patient leans: N/A  Psychiatric Specialty Exam: ROS  There were no vitals taken for this visit.There is no height or weight on file to calculate BMI.  General Appearance: Casual and Fairly Groomed  Eye Contact:  Good  Speech:  Normal Rate  Volume:  Decreased  Mood:  Depressed and Dysphoric  Affect:  Appropriate and Congruent  Thought Process:  Goal Directed and Descriptions of Associations: Intact  Orientation:  Full (Time, Place, and Person)  Thought Content: Logical   Suicidal Thoughts:  No  Homicidal Thoughts:  No  Memory:  Immediate;   Good  Judgement:  Good  Insight:  Good  Psychomotor Activity:   Normal  Concentration:  Concentration: Good  Recall:  Good  Fund of Knowledge: Good  Language: Good  Akathisia:  Negative  Handed:  Right  AIMS (if indicated): not done  Assets:  Housing Microbiologist Vocational/Educational  ADL's:  Intact  Cognition: WNL  Sleep:  Fair   Screenings: GAD-7     Office Visit from 06/04/2019 in [facility] Office Visit from 04/22/2017 in [facility]  Total GAD-7 Score  16  5    PHQ2-9     Office Visit from 06/04/2019 in [facility] from 04/22/2017 in [facility]  PHQ-2 Total Score  3  1  PHQ-9 Total Score  7  6      Assessment and Plan:    At this time the patient is being treated for major depression and takes the maximum dose of Zoloft 200 mg.  She has a problem with insomnia which is her second problem and she takes Vistaril 25 mg 2 at night and seems to work.  Generally she is sleeping and eating well.  She denies the use of alcohol or drugs.  She only has 1 child a daughter who is a Writer school in [place].  She wants me to know that.  She is not involved in any relationships at this time.  Today I strongly recommended that she consider supportive psychotherapy and when we call her back in a few days to get her an appointment to be seen again in 2 months we will give her the name of some therapist to call.  Patient is not suicidal.  She functions independently.  She  lives alone.  Her back pain is much improved.  Emotionally she is somewhat stable but I think she benefit by talking to somebody and supportive psychotherapy on a regular basis. Status of current problems: gradually improving  Labs Ordered: No orders of the defined types were placed in this encounter.   Labs Reviewed: n/a  Collateral Obtained/Records Reviewed: n/a  Plan:  Increase Effexor to 75 mg XR for 1 month, then increase to 150 mg XR  daily Vistaril 25 mg TID prn for anxiety, sleep RTC 10-12 weeks follow up with Dr. Casimiro Needle Therapy referral Joslyn Devon in 3-4 weeks  Jerral Ralph, MD 11/16/2019, 10:28 AM

## 2019-11-19 ENCOUNTER — Telehealth: Payer: Self-pay | Admitting: Internal Medicine

## 2019-11-19 ENCOUNTER — Telehealth: Payer: Self-pay | Admitting: Gastroenterology

## 2019-11-19 NOTE — Telephone Encounter (Signed)
Last seen by Dr Silverio Decamp 01/13/2016.  Called back. No answer. Left a message of returning her call.

## 2019-11-19 NOTE — Telephone Encounter (Signed)
Pt stated for the last week she has had on sets of nausea every day she believed is associated with her hernia. She would like to know if Dr. Sharlet Salina would send in rx for Phenergan or something to help with nausea. Please advise.  [place], [place] [address] [place] Phone:  [phone]  Fax:  [phone]

## 2019-11-19 NOTE — Telephone Encounter (Signed)
Can have virtual visit to talk about.

## 2019-11-20 NOTE — Telephone Encounter (Signed)
Pt scheduled  

## 2019-11-21 ENCOUNTER — Ambulatory Visit (INDEPENDENT_AMBULATORY_CARE_PROVIDER_SITE_OTHER): Payer: BC Managed Care – PPO | Admitting: Internal Medicine

## 2019-11-21 ENCOUNTER — Encounter: Payer: Self-pay | Admitting: Internal Medicine

## 2019-11-21 DIAGNOSIS — K219 Gastro-esophageal reflux disease without esophagitis: Secondary | ICD-10-CM | POA: Diagnosis not present

## 2019-11-21 MED ORDER — PANTOPRAZOLE SODIUM 40 MG PO TBEC: 40.0000 mg | DELAYED_RELEASE_TABLET | Freq: Every day | ORAL | 3 refills | Status: DC

## 2019-11-21 MED ORDER — ONDANSETRON HCL 4 MG PO TABS: 4.0000 mg | ORAL_TABLET | Freq: Three times a day (TID) | ORAL | 0 refills | Status: DC | PRN

## 2019-11-21 MED FILL — PANTOPRAZOLE SOD DR 40 MG T: 40 | 30 days supply | Qty: 30 | Fill #0

## 2019-11-21 MED FILL — ONDANSETRON HCL 4 MG TABLET: 4 | 10 days supply | Qty: 30 | Fill #0

## 2019-11-21 NOTE — Assessment & Plan Note (Addendum)
Change omeprazole to protonix for lack of efficacy. She will call back in 1-2 weeks if no improvement. Rx zofran for nausea prn.

## 2019-11-21 NOTE — Progress Notes (Signed)
Virtual Visit via Video Note  I connected with Joann Page on 11/21/19 at 10:00 AM EST by a video enabled telemedicine application and verified that I am speaking with the correct person using two identifiers.  The patient and the provider were at separate locations throughout the entire encounter.   I discussed the limitations of evaluation and management by telemedicine and the availability of in person appointments. The patient expressed understanding and agreed to proceed. The patient and the provider were the only parties present for the visit unless noted in HPI below.  History of Present Illness: The patient is a 58 y.o. female with visit for worsening GERD and regurgitation of food. Started a couple of months ago with some worsening GERD. She has stopped eating as many salads as this was giving her stomach problems. She will get 3-4 days in a row every month or so where she is eating and food will come back up. Denies vomiting. Some low grade nausea/queasy. Has no fevers or chills. Denies post nasal drip or congestion. Denies SOB or cough. Overall it is stable. Has tried omeprazole daily which she has been on for some time  Observations/Objective: Appearance: normal, breathing appears normal, casual grooming, abdomen does not appear distended, throat normal, memory normal, mental status is A and O times 3  Assessment and Plan: See problem oriented charting  Follow Up Instructions: change to protonix, if no improvement will call back and may need to return to GI  I discussed the assessment and treatment plan with the patient. The patient was provided an opportunity to ask questions and all were answered. The patient agreed with the plan and demonstrated an understanding of the instructions.   The patient was advised to call back or seek an in-person evaluation if the symptoms worsen or if the condition fails to improve as anticipated.  Hoyt Koch, MD

## 2019-11-25 MED FILL — HYDROCODON-APAP 10-325: 10-325 | 30 days supply | Qty: 60 | Fill #0

## 2019-12-03 MED FILL — CYCLOBENZAPRINE HCL 10 MG T: 10 | 30 days supply | Qty: 90 | Fill #1

## 2019-12-17 MED FILL — PANTOPRAZOLE SOD DR 40 MG T: 40 | 30 days supply | Qty: 30 | Fill #1

## 2019-12-17 MED FILL — TAMOXIFEN 20 MG TABLET: 20 | 30 days supply | Qty: 30 | Fill #6

## 2019-12-17 MED FILL — HYDROCHLOROTHIAZIDE 12.5 MG: 12.5 | 30 days supply | Qty: 30 | Fill #5

## 2019-12-24 MED FILL — HYDROCODON-APAP 10-325: 10-325 | 30 days supply | Qty: 60 | Fill #0

## 2020-01-14 ENCOUNTER — Other Ambulatory Visit: Payer: Self-pay | Admitting: Internal Medicine

## 2020-01-14 MED FILL — TAMOXIFEN 20 MG TABLET: 20 | 30 days supply | Qty: 30 | Fill #7

## 2020-01-14 MED FILL — HYDROCHLOROTHIAZIDE 12.5 MG: 12.5 | 30 days supply | Qty: 30 | Fill #0

## 2020-01-14 MED FILL — PANTOPRAZOLE SOD DR 40 MG T: 40 | 30 days supply | Qty: 30 | Fill #2

## 2020-01-18 ENCOUNTER — Ambulatory Visit (HOSPITAL_COMMUNITY): Payer: BC Managed Care – PPO | Admitting: Psychiatry

## 2020-01-18 ENCOUNTER — Other Ambulatory Visit: Payer: Self-pay

## 2020-01-21 MED FILL — HYDROCODON-APAP 10-325: 10-325 | 30 days supply | Qty: 60 | Fill #0

## 2020-01-26 IMAGING — MR MR LUMBAR SPINE W/O CM
4 of 5 series · 18 of 48 positions shown · non-contrast
Comparison: Lumbar MRI 07/29/2013

CLINICAL DATA: Low back pain

EXAM:
MRI LUMBAR SPINE WITHOUT CONTRAST
TECHNIQUE: Multiplanar, multisequence MR imaging of the lumbar spine was
performed. No intravenous contrast was administered.

[Series 6: T2 · sagittal · 4.0mm · 0.73mm/px · 6 of 15 slices shown (1 of 2)]
[im 1/15]
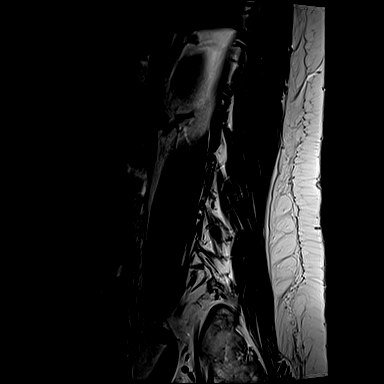
[im 3/15]
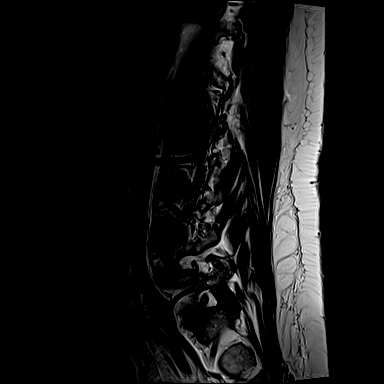
[im 6/15]
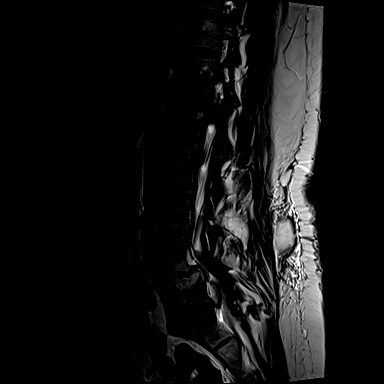
[im 9/15]
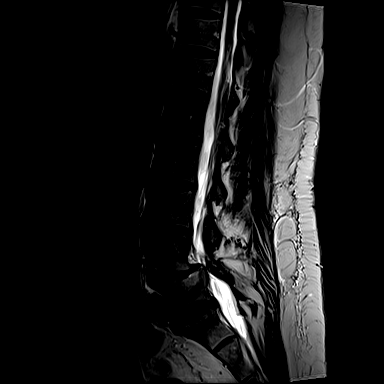
[im 12/15]
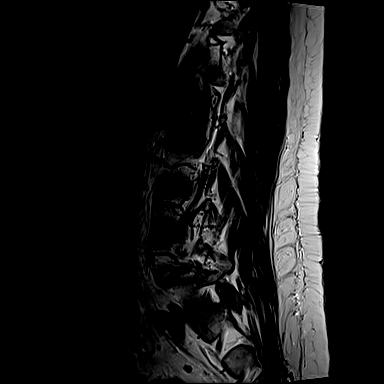
[im 15/15]
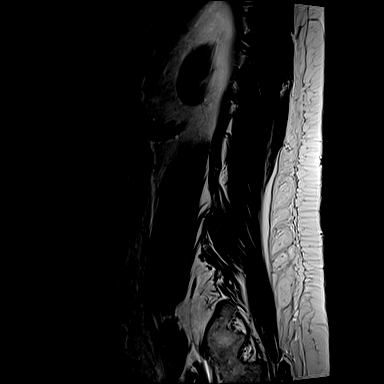

[Series 7: T1 · sagittal · 4.0mm · 0.73mm/px · 3 of 15 slices shown (1 of 2)]
[im 3/15]
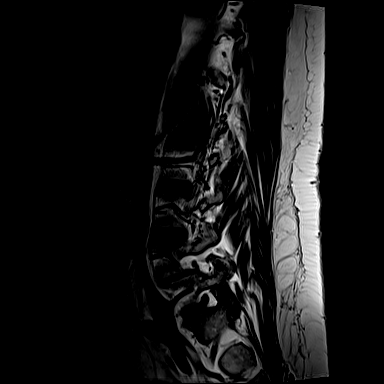
[im 9/15]
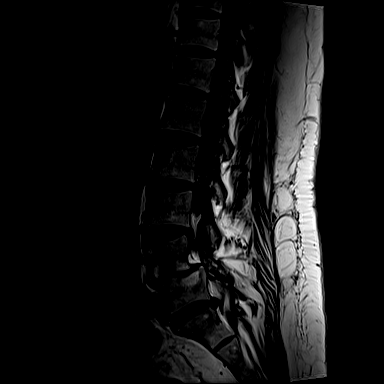
[im 15/15]
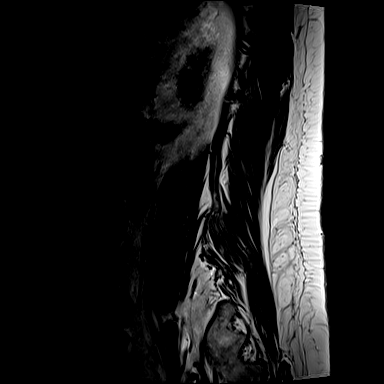

[Series 11: T1 · axial · 4.0mm · 0.28mm/px · z∈[-89,+65]mm · 3 of 39 slices shown (2 of 2)]
[im 6/39]
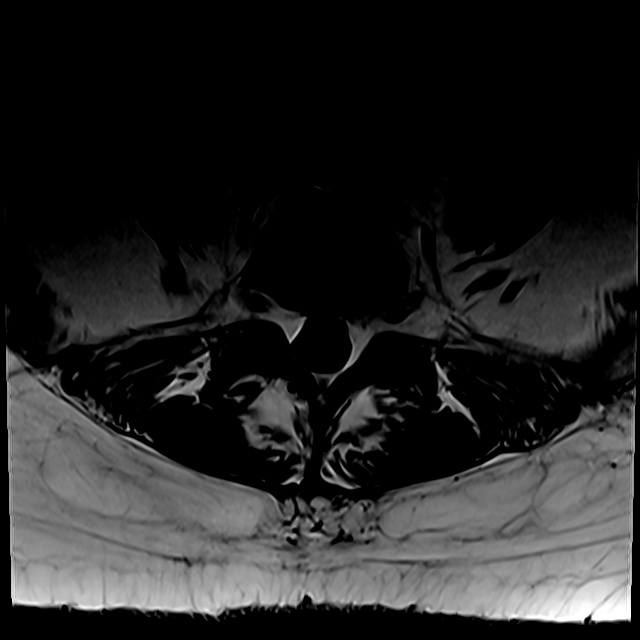
[im 20/39]
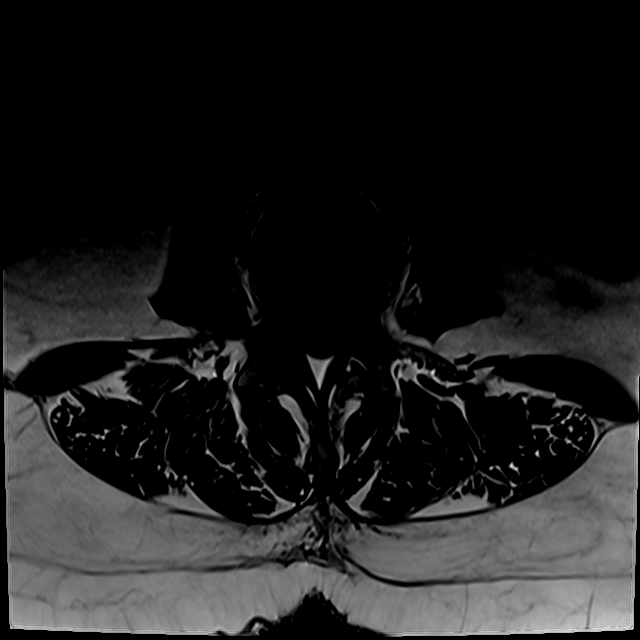
[im 33/39]
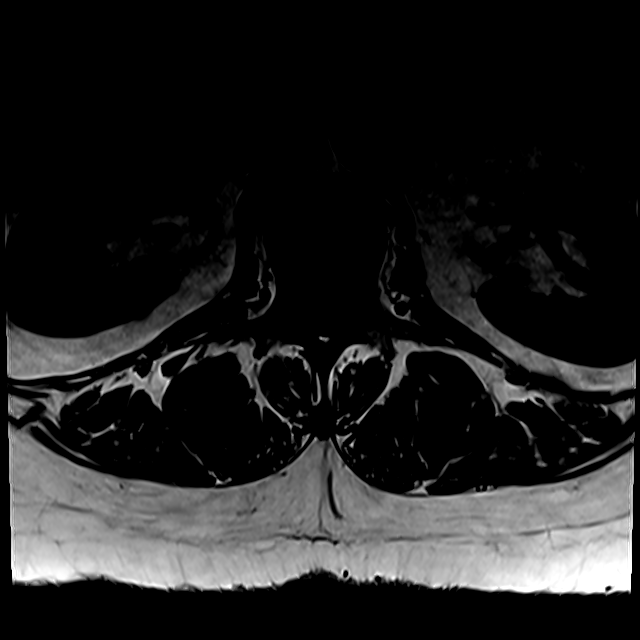

[Series 14: T2 · axial · 4.0mm · 0.28mm/px · z∈[-113,+65]mm · 6 of 39 slices shown (2 of 2)]
[im 1/39]
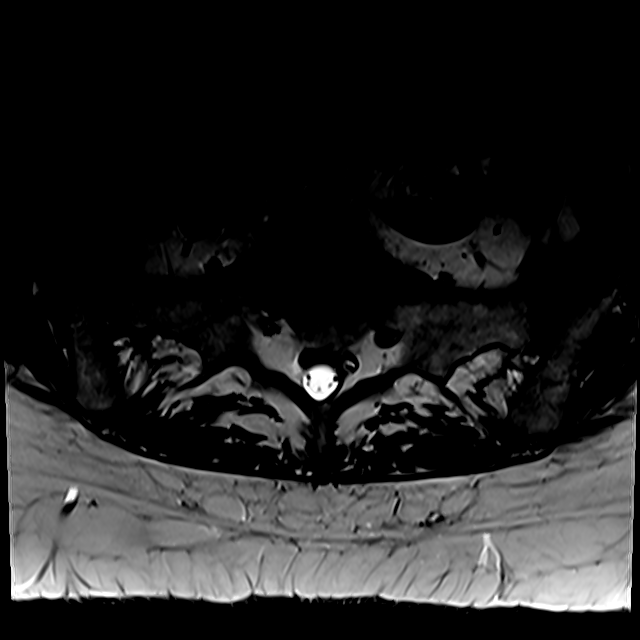
[im 6/39]
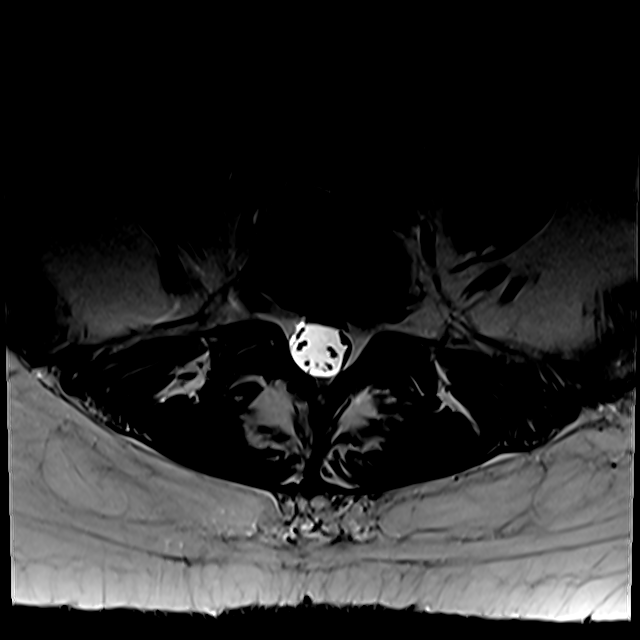
[im 11/39]
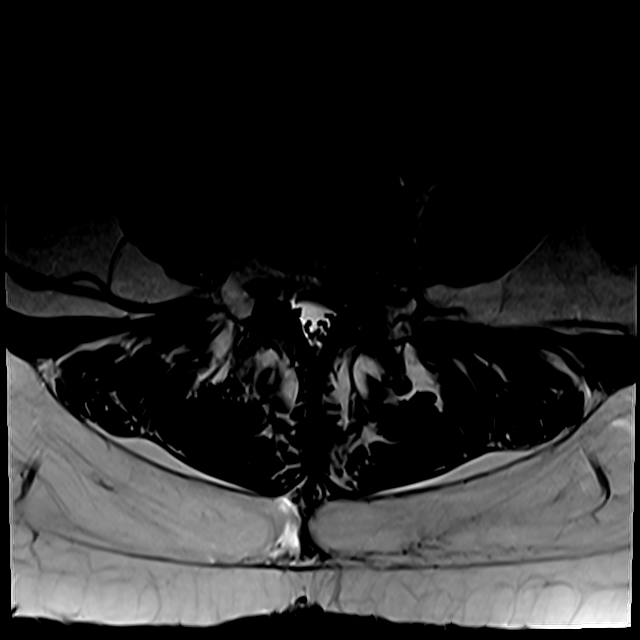
[im 17/39]
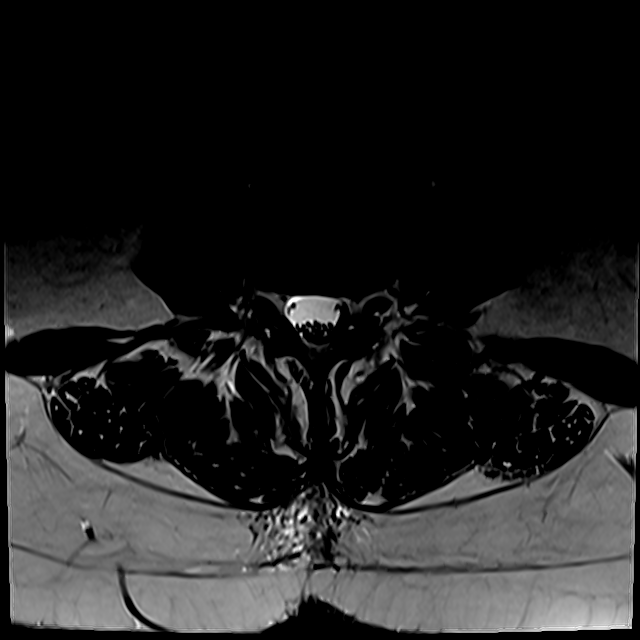
[im 20/39]
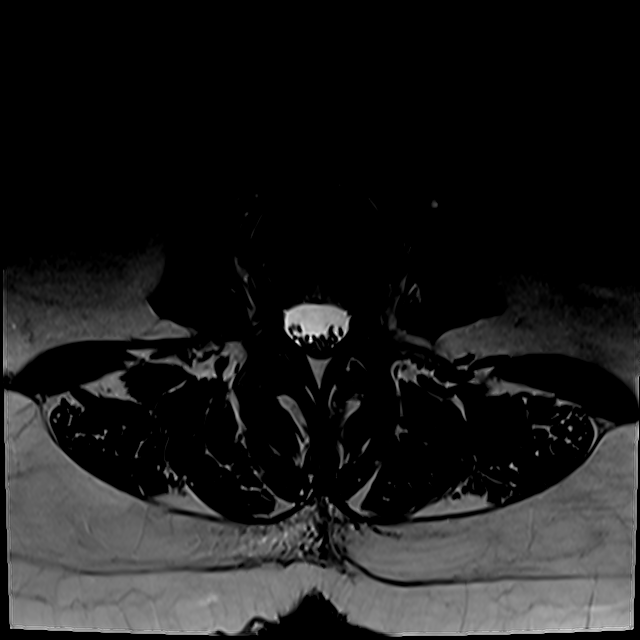
[im 33/39]
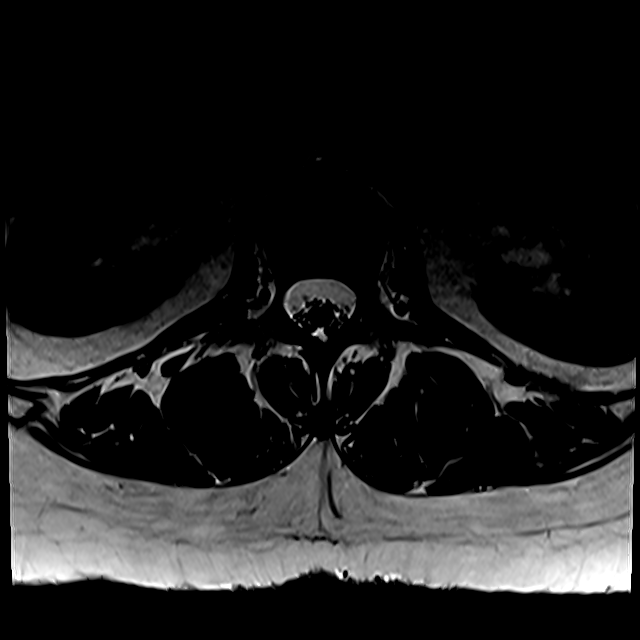

[18 of 48 positions shown; findings below may reference images not displayed]

FINDINGS: Segmentation:  Normal

Alignment: Mild retrolisthesis L3-4 unchanged. 7 mm anterolisthesis
L4-5 unchanged.

Vertebrae:  Negative for fracture or mass.

Conus medullaris and cauda equina: Conus extends to the L1-2 level.
Conus and cauda equina appear normal.

Paraspinal and other soft tissues: Negative for paraspinous mass or
fluid collection

Disc levels:

T12-L1: Negative

L1-2: Negative

L2-3: Mild disc and facet degeneration without stenosis

L3-4: Slight retrolisthesis. Mild disc and facet degeneration
without stenosis

L4-5: 7 mm anterolisthesis. Advanced facet and disc degeneration.
Mild to moderate spinal stenosis has improved in the interval.
Severe subarticular stenosis on the left is unchanged. Moderate
subarticular stenosis on the right is unchanged

L5-S1: Mild disc and facet degeneration. Negative for disc
protrusion or stenosis.
IMPRESSION: Advanced disc and facet degeneration at L4-5 with 7 mm
anterolisthesis. Mild to moderate spinal stenosis shows improvement.
Severe subarticular stenosis on the left is unchanged and moderate
subarticular stenosis on the right is unchanged

Mild degenerative changes elsewhere in the lumbar spine as described
above.

## 2020-02-14 MED FILL — HYDROCHLOROTHIAZIDE 12.5 MG: 12.5 | 30 days supply | Qty: 30 | Fill #1

## 2020-02-14 MED FILL — TAMOXIFEN 20 MG TABLET: 20 | 30 days supply | Qty: 30 | Fill #8

## 2020-02-14 MED FILL — PANTOPRAZOLE SOD DR 40 MG T: 40 | 30 days supply | Qty: 30 | Fill #3

## 2020-02-15 MED FILL — CYCLOBENZAPRINE HCL 10 MG T: 10 | 30 days supply | Qty: 90 | Fill #0

## 2020-02-18 MED FILL — HYDROCODON-APAP 10-325: 10-325 | 30 days supply | Qty: 60 | Fill #0

## 2020-02-27 ENCOUNTER — Telehealth: Payer: Self-pay | Admitting: Internal Medicine

## 2020-02-27 NOTE — Telephone Encounter (Signed)
New Message:   Pt is calling and wanting to know if she can be given information on who is giving the [employer] and [employer].

## 2020-02-28 ENCOUNTER — Ambulatory Visit: Payer: BC Managed Care – PPO | Attending: Internal Medicine

## 2020-02-28 ENCOUNTER — Ambulatory Visit: Payer: Self-pay

## 2020-02-28 DIAGNOSIS — Z23 Encounter for immunization: Secondary | ICD-10-CM

## 2020-02-28 NOTE — Progress Notes (Signed)
   Covid-19 Vaccination Clinic  Name:  [name]    MRN: [id number] DOB: 24-Nov-1961  02/28/2020  [name] was observed post Covid-19 immunization for 15 minutes without incident. She was provided with Vaccine Information Sheet and instruction to access the V-Safe system.   [name] was instructed to call 911 with any severe reactions post vaccine: Marland Kitchen Difficulty breathing  . Swelling of face and throat  . A fast heartbeat  . A bad rash all over body  . Dizziness and weakness   Immunizations Administered    Name Date Dose VIS Date Route   Pfizer COVID-19 Vaccine 02/28/2020 10:17 AM 0.3 mL 11/09/2019 Intramuscular   Manufacturer: [place]   Lot: [id number]   [place]: [id number]

## 2020-02-28 NOTE — Telephone Encounter (Signed)
Pt advised.

## 2020-03-14 MED FILL — PANTOPRAZOLE SOD DR 40 MG T: 40 | 30 days supply | Qty: 30 | Fill #4

## 2020-03-14 MED FILL — TAMOXIFEN 20 MG TABLET: 20 | 30 days supply | Qty: 30 | Fill #9

## 2020-03-14 MED FILL — HYDROCHLOROTHIAZIDE 12.5 MG: 12.5 | 30 days supply | Qty: 30 | Fill #2

## 2020-03-17 MED FILL — HYDROCODON-APAP 10-325: 10-325 | 30 days supply | Qty: 60 | Fill #0

## 2020-03-25 ENCOUNTER — Ambulatory Visit: Payer: BC Managed Care – PPO | Attending: Internal Medicine

## 2020-03-25 DIAGNOSIS — Z23 Encounter for immunization: Secondary | ICD-10-CM

## 2020-03-25 NOTE — Progress Notes (Addendum)
   Covid-19 Vaccination Clinic  Name:  Joann Page    MRN: [id number] DOB: 04-15-1961  03/25/2020  Joann Page was observed post Covid-19 immunization for 68mins without incident. She was provided with Vaccine Information Sheet and instruction to access the V-Safe system.   Joann Page was instructed to call 911 with any severe reactions post vaccine: Marland Kitchen Difficulty breathing  . Swelling of face and throat  . A fast heartbeat  . A bad rash all over body  . Dizziness and weakness   Immunizations Administered    Name Date Dose VIS Date Route   Pfizer COVID-19 Vaccine 03/25/2020 10:27 AM 0.3 mL 01/23/2019 Intramuscular   Manufacturer: [place]   Lot: [id number]   [place]: [id number]

## 2020-04-14 DIAGNOSIS — M7701 Medial epicondylitis, right elbow: Secondary | ICD-10-CM | POA: Diagnosis not present

## 2020-04-14 MED FILL — PANTOPRAZOLE SOD DR 40 MG T: 40 | 30 days supply | Qty: 30 | Fill #5

## 2020-04-14 MED FILL — TAMOXIFEN 20 MG TABLET: 20 | 30 days supply | Qty: 30 | Fill #10

## 2020-04-14 MED FILL — HYDROCODON-APAP 10-325: 10-325 | 30 days supply | Qty: 60 | Fill #0

## 2020-04-14 MED FILL — HYDROCHLOROTHIAZIDE 12.5 MG: 12.5 | 30 days supply | Qty: 30 | Fill #3

## 2020-04-14 MED FILL — predniSONE 10 MG (48) TBPK: 10 | 12 days supply | Qty: 48 | Fill #0

## 2020-04-25 ENCOUNTER — Ambulatory Visit: Payer: BC Managed Care – PPO | Admitting: Internal Medicine

## 2020-05-02 DIAGNOSIS — Z6832 Body mass index (BMI) 32.0-32.9, adult: Secondary | ICD-10-CM | POA: Diagnosis not present

## 2020-05-02 DIAGNOSIS — Z01419 Encounter for gynecological examination (general) (routine) without abnormal findings: Secondary | ICD-10-CM | POA: Diagnosis not present

## 2020-05-02 MED FILL — NITROFURANTOIN MONO-MCR 100: 100 | 30 days supply | Qty: 30 | Fill #0

## 2020-05-12 MED FILL — TAMOXIFEN 20 MG TABLET: 20 | 30 days supply | Qty: 30 | Fill #11

## 2020-05-12 MED FILL — HYDROCHLOROTHIAZIDE 12.5 MG: 12.5 | 30 days supply | Qty: 30 | Fill #4

## 2020-05-12 MED FILL — HYDROCODON-APAP 10-325: 10-325 | 30 days supply | Qty: 60 | Fill #0

## 2020-05-12 MED FILL — PANTOPRAZOLE SOD DR 40 MG T: 40 | 30 days supply | Qty: 30 | Fill #6

## 2020-05-21 ENCOUNTER — Inpatient Hospital Stay: Payer: BC Managed Care – PPO | Attending: Hematology and Oncology | Admitting: Hematology and Oncology

## 2020-05-21 NOTE — Assessment & Plan Note (Deleted)
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 hot flashesmostly resolved.  Breast Cancer Surveillance: 1. Breast exam: 05/21/2020: Benign 2. [facility] benign breast density category 8. Patient will need new mammogram.  Exercising and watching her diet: lost 50 lbs Return to clinic in 1 year for follow-up

## 2020-06-04 ENCOUNTER — Other Ambulatory Visit: Payer: Self-pay | Admitting: Sports Medicine

## 2020-06-04 ENCOUNTER — Other Ambulatory Visit (HOSPITAL_COMMUNITY): Payer: Self-pay | Admitting: Sports Medicine

## 2020-06-04 DIAGNOSIS — M545 Low back pain, unspecified: Secondary | ICD-10-CM

## 2020-06-05 ENCOUNTER — Other Ambulatory Visit: Payer: Self-pay

## 2020-06-05 ENCOUNTER — Encounter: Payer: Self-pay | Admitting: Internal Medicine

## 2020-06-05 ENCOUNTER — Ambulatory Visit (INDEPENDENT_AMBULATORY_CARE_PROVIDER_SITE_OTHER): Payer: BC Managed Care – PPO | Admitting: Internal Medicine

## 2020-06-05 ENCOUNTER — Other Ambulatory Visit: Payer: BC Managed Care – PPO

## 2020-06-05 VITALS — BP 142/88 | HR 82 | Temp 98.3°F | Ht 67.0 in | Wt 209.0 lb

## 2020-06-05 DIAGNOSIS — K219 Gastro-esophageal reflux disease without esophagitis: Secondary | ICD-10-CM

## 2020-06-05 DIAGNOSIS — I10 Essential (primary) hypertension: Secondary | ICD-10-CM

## 2020-06-05 DIAGNOSIS — Z0000 Encounter for general adult medical examination without abnormal findings: Secondary | ICD-10-CM

## 2020-06-05 DIAGNOSIS — C50512 Malignant neoplasm of lower-outer quadrant of left female breast: Secondary | ICD-10-CM

## 2020-06-05 DIAGNOSIS — Z170 Estrogen receptor positive status [ER+]: Secondary | ICD-10-CM

## 2020-06-05 MED ORDER — HYDROCHLOROTHIAZIDE 25 MG PO TABS: 25.0000 mg | ORAL_TABLET | Freq: Every day | ORAL | 3 refills | Status: DC

## 2020-06-05 MED FILL — HYDROCHLOROTHIAZIDE 25 MG T: 25 | 30 days supply | Qty: 30 | Fill #0

## 2020-06-05 NOTE — Progress Notes (Signed)
   Subjective:   Patient ID: Joann Page, female    DOB: August 28, 1961, 59 y.o.   MRN: [id number]  HPI The patient is a 59 YO female coming in for physical.  PMH, [place], social history reviewed and updated  Review of Systems  Constitutional: Negative.   HENT: Negative.   Eyes: Negative.   Respiratory: Negative for cough, chest tightness and shortness of breath.   Cardiovascular: Negative for chest pain, palpitations and leg swelling.  Gastrointestinal: Negative for abdominal distention, abdominal pain, constipation, diarrhea, nausea and vomiting.  Musculoskeletal: Negative.   Skin: Negative.   Neurological: Negative.   Psychiatric/Behavioral: Negative.     Objective:  Physical Exam Constitutional:      Appearance: She is well-developed.  HENT:     Head: Normocephalic and atraumatic.  Cardiovascular:     Rate and Rhythm: Normal rate and regular rhythm.  Pulmonary:     Effort: Pulmonary effort is normal. No respiratory distress.     Breath sounds: Normal breath sounds. No wheezing or rales.  Abdominal:     General: Bowel sounds are normal. There is no distension.     Palpations: Abdomen is soft.     Tenderness: There is no abdominal tenderness. There is no rebound.  Musculoskeletal:     Cervical back: Normal range of motion.  Skin:    General: Skin is warm and dry.  Neurological:     Mental Status: She is alert and oriented to person, place, and time.     Coordination: Coordination normal.     Vitals:   06/05/20 1441 06/05/20 1524  BP: (!) 152/98 (!) 142/88  Pulse: 82   Temp: 98.3 F (36.8 C)   TempSrc: Oral   SpO2: 99%   Weight: 209 lb (94.8 kg)   Height: 5\' 7"  (1.702 m)     This visit occurred during the SARS-CoV-2 public health emergency.  Safety protocols were in place, including screening questions prior to the visit, additional usage of staff PPE, and extensive cleaning of exam room while observing appropriate contact time as indicated for disinfecting  solutions.   Assessment & Plan:

## 2020-06-05 NOTE — Patient Instructions (Signed)
We will change the blood pressure medicine hctz to 25 mg daily.    Health Maintenance, Female Adopting a healthy lifestyle and getting preventive care are important in promoting health and wellness. Ask your health care provider about:  The right schedule for you to have regular tests and exams.  Things you can do on your own to prevent diseases and keep yourself healthy. What should I know about diet, weight, and exercise? Eat a healthy diet   Eat a diet that includes plenty of vegetables, fruits, low-fat dairy products, and lean protein.  Do not eat a lot of foods that are high in solid fats, added sugars, or sodium. Maintain a healthy weight Body mass index (BMI) is used to identify weight problems. It estimates body fat based on height and weight. Your health care provider can help determine your BMI and help you achieve or maintain a healthy weight. Get regular exercise Get regular exercise. This is one of the most important things you can do for your health. Most adults should:  Exercise for at least 150 minutes each week. The exercise should increase your heart rate and make you sweat (moderate-intensity exercise).  Do strengthening exercises at least twice a week. This is in addition to the moderate-intensity exercise.  Spend less time sitting. Even light physical activity can be beneficial. Watch cholesterol and blood lipids Have your blood tested for lipids and cholesterol at 57 years of age, then have this test every 5 years. Have your cholesterol levels checked more often if:  Your lipid or cholesterol levels are high.  You are older than 57 years of age.  You are at high risk for heart disease. What should I know about cancer screening? Depending on your health history and family history, you may need to have cancer screening at various ages. This may include screening for:  Breast cancer.  Cervical cancer.  Colorectal cancer.  Skin cancer.  Lung  cancer. What should I know about heart disease, diabetes, and high blood pressure? Blood pressure and heart disease  High blood pressure causes heart disease and increases the risk of stroke. This is more likely to develop in people who have high blood pressure readings, are of African descent, or are overweight.  Have your blood pressure checked: ? Every 3-5 years if you are 65-36 years of age. ? Every year if you are 12 years old or older. Diabetes Have regular diabetes screenings. This checks your fasting blood sugar level. Have the screening done:  Once every three years after age 72 if you are at a normal weight and have a low risk for diabetes.  More often and at a younger age if you are overweight or have a high risk for diabetes. What should I know about preventing infection? Hepatitis B If you have a higher risk for hepatitis B, you should be screened for this virus. Talk with your health care provider to find out if you are at risk for hepatitis B infection. Hepatitis C Testing is recommended for:  Everyone born from [birth year] through 1963.  Anyone with known risk factors for hepatitis C. Sexually transmitted infections (STIs)  Get screened for STIs, including gonorrhea and chlamydia, if: ? You are sexually active and are younger than 57 years of age. ? You are older than 57 years of age and your health care provider tells you that you are at risk for this type of infection. ? Your sexual activity has changed since you were last screened, and  you are at increased risk for chlamydia or gonorrhea. Ask your health care provider if you are at risk.  Ask your health care provider about whether you are at high risk for HIV. Your health care provider may recommend a prescription medicine to help prevent HIV infection. If you choose to take medicine to prevent HIV, you should first get tested for HIV. You should then be tested every 3 months for as long as you are taking the  medicine. Pregnancy  If you are about to stop having your period (premenopausal) and you may become pregnant, seek counseling before you get pregnant.  Take 400 to 800 micrograms (mcg) of folic acid every day if you become pregnant.  Ask for birth control (contraception) if you want to prevent pregnancy. Osteoporosis and menopause Osteoporosis is a disease in which the bones lose minerals and strength with aging. This can result in bone fractures. If you are 60 years old or older, or if you are at risk for osteoporosis and fractures, ask your health care provider if you should:  Be screened for bone loss.  Take a calcium or vitamin D supplement to lower your risk of fractures.  Be given hormone replacement therapy (HRT) to treat symptoms of menopause. Follow these instructions at home: Lifestyle  Do not use any products that contain nicotine or tobacco, such as cigarettes, e-cigarettes, and chewing tobacco. If you need help quitting, ask your health care provider.  Do not use street drugs.  Do not share needles.  Ask your health care provider for help if you need support or information about quitting drugs. Alcohol use  Do not drink alcohol if: ? Your health care provider tells you not to drink. ? You are pregnant, may be pregnant, or are planning to become pregnant.  If you drink alcohol: ? Limit how much you use to 0-1 drink a day. ? Limit intake if you are breastfeeding.  Be aware of how much alcohol is in your drink. In the [place] one drink equals one 12 oz bottle of beer (355 mL), one 5 oz glass of wine (148 mL), or one 1 oz glass of hard liquor (44 mL). General instructions  Schedule regular health, dental, and eye exams.  Stay current with your vaccines.  Tell your health care provider if: ? You often feel depressed. ? You have ever been abused or do not feel safe at home. Summary  Adopting a healthy lifestyle and getting preventive care are important in  promoting health and wellness.  Follow your health care provider's instructions about healthy diet, exercising, and getting tested or screened for diseases.  Follow your health care provider's instructions on monitoring your cholesterol and blood pressure. This information is not intended to replace advice given to you by your health care provider. Make sure you discuss any questions you have with your health care provider. Document Revised: 11/08/2018 Document Reviewed: 11/08/2018 Elsevier Patient Education  2020 [employer].

## 2020-06-06 LAB — CBC
HCT: 39.5 % (ref 35.0–45.0)
Hemoglobin: 12.6 g/dL (ref 11.7–15.5)
MCH: 28.6 pg (ref 27.0–33.0)
MCHC: 31.9 g/dL — ABNORMAL LOW (ref 32.0–36.0)
MCV: 89.6 fL (ref 80.0–100.0)
MPV: 10.7 fL (ref 7.5–12.5)
Platelets: 201 10*3/uL (ref 140–400)
RBC: 4.41 10*6/uL (ref 3.80–5.10)
RDW: 11.8 % (ref 11.0–15.0)
WBC: 5.3 10*3/uL (ref 3.8–10.8)

## 2020-06-06 LAB — COMPREHENSIVE METABOLIC PANEL
AG Ratio: 1.6 (calc) (ref 1.0–2.5)
ALT: 20 U/L (ref 6–29)
AST: 24 U/L (ref 10–35)
Albumin: 3.9 g/dL (ref 3.6–5.1)
Alkaline phosphatase (APISO): 46 U/L (ref 37–153)
BUN: 10 mg/dL (ref 7–25)
CO2: 28 mmol/L (ref 20–32)
Calcium: 9.1 mg/dL (ref 8.6–10.4)
Chloride: 102 mmol/L (ref 98–110)
Creat: 0.84 mg/dL (ref 0.50–1.05)
Globulin: 2.5 g/dL (calc) (ref 1.9–3.7)
Glucose, Bld: 114 mg/dL — ABNORMAL HIGH (ref 65–99)
Potassium: 3.6 mmol/L (ref 3.5–5.3)
Sodium: 138 mmol/L (ref 135–146)
Total Bilirubin: 0.3 mg/dL (ref 0.2–1.2)
Total Protein: 6.4 g/dL (ref 6.1–8.1)

## 2020-06-06 LAB — LIPID PANEL
Cholesterol: 206 mg/dL — ABNORMAL HIGH (ref <200)
HDL: 55 mg/dL (ref >=50)
LDL Cholesterol (Calc): 113 mg/dL (calc) — ABNORMAL HIGH
Non-HDL Cholesterol (Calc): 151 mg/dL (calc) — ABNORMAL HIGH (ref <130)
Total CHOL/HDL Ratio: 3.7 (calc) (ref <5.0)
Triglycerides: 263 mg/dL — ABNORMAL HIGH (ref <150)

## 2020-06-06 LAB — HEMOGLOBIN A1C
Hgb A1c MFr Bld: 5.4 % of total Hgb (ref <5.7)
Mean Plasma Glucose: 108 (calc)
eAG (mmol/L): 6 (calc)

## 2020-06-06 NOTE — Assessment & Plan Note (Signed)
Doing well with protonix daily.

## 2020-06-06 NOTE — Assessment & Plan Note (Signed)
BP above goal and increasing hctz to 25 mg daily from 12.5 mg daily. Checking CMP and adjust as needed.

## 2020-06-06 NOTE — Assessment & Plan Note (Signed)
Still taking tamoxifen.

## 2020-06-06 NOTE — Assessment & Plan Note (Signed)
Flu shot yearly. Covid-19 complete. Shingrix complete. Tetanus up to date. Colonoscopy up to date. Mammogram up to date, pap smear up to date. Counseled about sun safety and mole surveillance. Counseled about the dangers of distracted driving. Given 10 year screening recommendations.

## 2020-06-09 MED FILL — CYCLOBENZAPRINE HCL 10 MG T: 10 | 30 days supply | Qty: 90 | Fill #0

## 2020-06-09 MED FILL — HYDROCODON-APAP 10-325: 10-325 | 30 days supply | Qty: 60 | Fill #0

## 2020-06-13 ENCOUNTER — Other Ambulatory Visit: Payer: Self-pay | Admitting: Hematology and Oncology

## 2020-06-13 MED FILL — PANTOPRAZOLE SOD DR 40 MG T: 40 | 30 days supply | Qty: 30 | Fill #7

## 2020-06-13 MED FILL — TAMOXIFEN 20 MG TABLET: 20 | 30 days supply | Qty: 30 | Fill #0

## 2020-06-18 ENCOUNTER — Inpatient Hospital Stay: Payer: BC Managed Care – PPO | Admitting: Hematology and Oncology

## 2020-06-18 ENCOUNTER — Other Ambulatory Visit: Payer: Self-pay

## 2020-06-18 ENCOUNTER — Inpatient Hospital Stay: Payer: BC Managed Care – PPO | Attending: Hematology and Oncology | Admitting: Hematology and Oncology

## 2020-06-18 DIAGNOSIS — Z170 Estrogen receptor positive status [ER+]: Secondary | ICD-10-CM | POA: Diagnosis not present

## 2020-06-18 DIAGNOSIS — Z923 Personal history of irradiation: Secondary | ICD-10-CM | POA: Insufficient documentation

## 2020-06-18 DIAGNOSIS — Z791 Long term (current) use of non-steroidal anti-inflammatories (NSAID): Secondary | ICD-10-CM | POA: Insufficient documentation

## 2020-06-18 DIAGNOSIS — Z79810 Long term (current) use of selective estrogen receptor modulators (SERMs): Secondary | ICD-10-CM | POA: Insufficient documentation

## 2020-06-18 DIAGNOSIS — Z79899 Other long term (current) drug therapy: Secondary | ICD-10-CM | POA: Insufficient documentation

## 2020-06-18 DIAGNOSIS — C50512 Malignant neoplasm of lower-outer quadrant of left female breast: Secondary | ICD-10-CM | POA: Diagnosis not present

## 2020-06-18 NOTE — Assessment & Plan Note (Signed)
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 hot flashesmostly resolved.  Breast Cancer Surveillance: 1. Breast exam:06/18/20: Benign 2. Mammogramdone at [facility] 08/08/2018 benign breast density category 8  Exercising and watching her diet: lost 50 lbs Return to clinic in 1 year for follow-up

## 2020-06-18 NOTE — Assessment & Plan Note (Deleted)
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 hot flashesmostly resolved.  Breast Cancer Surveillance: 1. Breast exam:06/18/20: Benign 2. Mammogramdone at [facility] 08/08/2018 benign breast density category 8  Exercising and watching her diet: lost 50 lbs Return to clinic in 1 year for follow-up

## 2020-06-18 NOTE — Progress Notes (Signed)
Patient Care Team: Joann Koch, MD as PCP - General (Internal Medicine) Berle Mull, MD as Consulting Physician (Family Medicine) Lafayette Dragon, MD (Inactive) as Consulting Physician (Gastroenterology) Himmelrich, Bryson Ha, RD (Inactive) as Dietitian  DIAGNOSIS:    ICD-10-CM   1. Malignant neoplasm of lower-outer quadrant of left breast of female, estrogen receptor positive ([place])  C50.512    Z17.0     SUMMARY OF ONCOLOGIC HISTORY: Oncology History  Breast cancer of lower-outer quadrant of left female breast ([place])  07/03/2012 Initial Diagnosis   Left: IDC, MRI 2.3 cm mass   07/17/2012 Surgery   Left Lumpectomy: 1.7 cm, IDC, High grade, 4 SN neg,  with HG DCIS with necrosis, Oncotype Dx Intermed grade   07/31/2012 - 11/23/2012 Chemotherapy   Adjuvant chemo TC x 4   11/30/2012 - 01/12/2013 Radiation Therapy   Adjuvant XRT   01/18/2013 -  Anti-estrogen oral therapy   Adjuvant  tamoxifen 10 years     CHIEF COMPLIANT: Follow-up of breast cancer on tamoxifen   INTERVAL HISTORY: Joann Page is a 59 y.o. with above-mentioned history of left breast cancer treated with lumpectomy, adjuvant chemotherapy, radiation, and is currently on anti-estrogen therapy with tamoxifen. She presents to the clinic today for annual follow-up.  Her blood pressure appears to be high today.  She is tolerating tamoxifen extremely well.  Initially she had a lot of hot flashes but they have resolved.  She does have spinal stenosis which causes back pain.  Because of this sometimes she cannot exercise regularly.  ALLERGIES:  is allergic to sulfa antibiotics.  MEDICATIONS:  Current Outpatient Medications  Medication Sig Dispense Refill  . cyclobenzaprine (FLEXERIL) 10 MG tablet Take 1 tablet (10 mg total) by mouth 3 (three) times daily as needed for muscle spasms. 30 tablet 0  . hydrochlorothiazide (HYDRODIURIL) 25 MG tablet Take 1 tablet (25 mg total) by mouth daily. 90 tablet 3  . hydrOXYzine  (VISTARIL) 25 MG capsule 2  qhs   1  Prn  q day 90 capsule 3  . meloxicam (MOBIC) 15 MG tablet Take 1 tablet (15 mg total) by mouth daily. 30 tablet 2  . ondansetron (ZOFRAN) 4 MG tablet Take 1 tablet (4 mg total) by mouth every 8 (eight) hours as needed for nausea or vomiting. 30 tablet 0  . pantoprazole (PROTONIX) 40 MG tablet Take 1 tablet (40 mg total) by mouth daily. 90 tablet 3  . sertraline (ZOLOFT) 100 MG tablet 2  qam 60 tablet 3  . tamoxifen (NOLVADEX) 20 MG tablet TAKE 1 TABLET BY MOUTH ONCE DAILY 30 tablet 3  . vitamin B-12 (CYANOCOBALAMIN) 1000 MCG tablet Take 1 tablet (1,000 mcg total) by mouth daily. 30 tablet 0   No current facility-administered medications for this visit.    PHYSICAL EXAMINATION: ECOG PERFORMANCE STATUS: 1 - Symptomatic but completely ambulatory  Vitals:   06/18/20 1529 06/18/20 1530  BP: (!) 153/100 (!) 150/100  Pulse:    Resp:    Temp:    SpO2:     Filed Weights   06/18/20 1528  Weight: 206 lb 14.4 oz (93.8 kg)    BREAST: No palpable masses or nodules in either right or left breasts. No palpable axillary supraclavicular or infraclavicular adenopathy no breast tenderness or nipple discharge. (exam performed in the presence of a chaperone)  LABORATORY DATA:  I have reviewed the data as listed CMP Latest Ref Rng & Units 06/05/2020 06/04/2019 05/22/2018  Glucose 65 - 99 mg/dL  114(H) 113(H) 97  BUN 7 - 25 mg/dL 10 6 9   Creatinine 0.50 - 1.05 mg/dL 0.84 0.69 0.71  Sodium 135 - 146 mmol/L 138 140 140  Potassium 3.5 - 5.3 mmol/L 3.6 3.1(L) 4.0  Chloride 98 - 110 mmol/L 102 106 106  CO2 20 - 32 mmol/L 28 26 26   Calcium 8.6 - 10.4 mg/dL 9.1 8.5 8.8  Total Protein 6.1 - 8.1 g/dL 6.4 6.4 6.5  Total Bilirubin 0.2 - 1.2 mg/dL 0.3 0.3 0.3  Alkaline Phos 39 - 117 U/L - 40 41  AST 10 - 35 U/L 24 22 18   ALT 6 - 29 U/L 20 23 17     Lab Results  Component Value Date   WBC 5.3 06/05/2020   HGB 12.6 06/05/2020   HCT 39.5 06/05/2020   MCV 89.6 06/05/2020    PLT 201 06/05/2020   NEUTROABS 2.7 05/11/2016    ASSESSMENT & PLAN:  Breast cancer of lower-outer quadrant of left female breast [facility]) September 2013 Oncotype DX intermediate risk status post Taxotere Cytoxan x4 cycles followed by radiation therapy and adjuvant tamoxifen started 01/18/2013  Tamoxifen toxicity evaluation: Patient had a previous hysterectomy and does not need Pap smears. hot flashesmostly resolved. Vaginal dryness: She is experiencing vaginal dryness in spite of using Replens.  I discussed with her about talking with her gynecologist to see if topical estrogens can be used for the vaginal dryness.  I also talked to her about Josph Macho touch.  Breast Cancer Surveillance: 1. Breast exam:06/18/20: Benign 2. Mammogramdone at [facility] 08/08/2018 benign breast density category 8  Exercising and watching her diet: Her weight appears to be up and down based upon how she exercises and how she eats. Return to clinic in 1 year for follow-up    No orders of the defined types were placed in this encounter.  The patient has a good understanding of the overall plan. she agrees with it. she will call with any problems that may develop before the next visit here.  Total time spent: 20 mins including face to face time and time spent for planning, charting and coordination of care  Joann Lose, MD 06/18/2020  I, Cloyde Reams Dorshimer, am acting as scribe for Dr. Nicholas Page.  I have reviewed the above documentation for accuracy and completeness, and I agree with the above.

## 2020-06-19 ENCOUNTER — Encounter: Payer: Self-pay | Admitting: Hematology and Oncology

## 2020-06-20 ENCOUNTER — Telehealth: Payer: Self-pay | Admitting: Hematology and Oncology

## 2020-06-20 NOTE — Telephone Encounter (Signed)
Scheduled per 7/21 los. Called and spoke with pt, confirmed 7/21 appt

## 2020-06-29 ENCOUNTER — Other Ambulatory Visit: Payer: BC Managed Care – PPO

## 2020-07-07 MED FILL — HYDROCODON-APAP 10-325: 10-325 | 30 days supply | Qty: 60 | Fill #0

## 2020-07-10 MED FILL — HYDROCHLOROTHIAZIDE 25 MG T: 25 | 30 days supply | Qty: 30 | Fill #1

## 2020-07-10 MED FILL — TAMOXIFEN 20 MG TABLET: 20 | 30 days supply | Qty: 30 | Fill #1

## 2020-07-10 MED FILL — PANTOPRAZOLE SOD DR 40 MG T: 40 | 30 days supply | Qty: 30 | Fill #8

## 2020-08-05 MED FILL — CYCLOBENZAPRINE HCL 10 MG T: 10 | 30 days supply | Qty: 90 | Fill #0

## 2020-08-05 MED FILL — HYDROCODON-APAP 10-325: 10-325 | 30 days supply | Qty: 60 | Fill #0

## 2020-08-05 MED FILL — HYDROCHLOROTHIAZIDE 25 MG T: 25 | 30 days supply | Qty: 30 | Fill #2

## 2020-08-11 DIAGNOSIS — Z1231 Encounter for screening mammogram for malignant neoplasm of breast: Secondary | ICD-10-CM | POA: Diagnosis not present

## 2020-08-11 DIAGNOSIS — Z803 Family history of malignant neoplasm of breast: Secondary | ICD-10-CM | POA: Diagnosis not present

## 2020-08-11 LAB — HM MAMMOGRAPHY

## 2020-08-18 MED FILL — PANTOPRAZOLE SOD DR 40 MG T: 40 | 30 days supply | Qty: 30 | Fill #9

## 2020-08-18 MED FILL — NITROFURANTOIN MONO-MCR 100: 100 | 30 days supply | Qty: 30 | Fill #0

## 2020-08-18 MED FILL — TAMOXIFEN 20 MG TABLET: 20 | 30 days supply | Qty: 30 | Fill #2

## 2020-08-22 ENCOUNTER — Emergency Department (HOSPITAL_COMMUNITY)
Admission: EM | Admit: 2020-08-22 | Discharge: 2020-08-23 | Disposition: A | Discharge status: Home / Self Care | Payer: BC Managed Care – PPO | Attending: Emergency Medicine | Admitting: Emergency Medicine

## 2020-08-22 ENCOUNTER — Encounter (HOSPITAL_COMMUNITY): Payer: Self-pay | Admitting: Emergency Medicine

## 2020-08-22 ENCOUNTER — Emergency Department (HOSPITAL_COMMUNITY): Payer: BC Managed Care – PPO

## 2020-08-22 ENCOUNTER — Other Ambulatory Visit: Payer: Self-pay

## 2020-08-22 DIAGNOSIS — S3991XA Unspecified injury of abdomen, initial encounter: Secondary | ICD-10-CM | POA: Diagnosis not present

## 2020-08-22 DIAGNOSIS — I10 Essential (primary) hypertension: Secondary | ICD-10-CM | POA: Diagnosis not present

## 2020-08-22 DIAGNOSIS — I700 Atherosclerosis of aorta: Secondary | ICD-10-CM | POA: Diagnosis not present

## 2020-08-22 DIAGNOSIS — W010XXA Fall on same level from slipping, tripping and stumbling without subsequent striking against object, initial encounter: Secondary | ICD-10-CM | POA: Insufficient documentation

## 2020-08-22 DIAGNOSIS — S2242XA Multiple fractures of ribs, left side, initial encounter for closed fracture: Secondary | ICD-10-CM | POA: Insufficient documentation

## 2020-08-22 DIAGNOSIS — Z853 Personal history of malignant neoplasm of breast: Secondary | ICD-10-CM | POA: Insufficient documentation

## 2020-08-22 DIAGNOSIS — M47816 Spondylosis without myelopathy or radiculopathy, lumbar region: Secondary | ICD-10-CM | POA: Diagnosis not present

## 2020-08-22 DIAGNOSIS — R0781 Pleurodynia: Secondary | ICD-10-CM | POA: Diagnosis not present

## 2020-08-22 DIAGNOSIS — R109 Unspecified abdominal pain: Secondary | ICD-10-CM | POA: Diagnosis not present

## 2020-08-22 DIAGNOSIS — M4316 Spondylolisthesis, lumbar region: Secondary | ICD-10-CM | POA: Diagnosis not present

## 2020-08-22 DIAGNOSIS — Z79899 Other long term (current) drug therapy: Secondary | ICD-10-CM | POA: Diagnosis not present

## 2020-08-22 DIAGNOSIS — S2249XA Multiple fractures of ribs, unspecified side, initial encounter for closed fracture: Secondary | ICD-10-CM

## 2020-08-22 LAB — I-STAT CHEM 8, ED
BUN: 11 mg/dL (ref 6–20)
Calcium, Ion: 1.11 mmol/L — ABNORMAL LOW (ref 1.15–1.40)
Chloride: 103 mmol/L (ref 98–111)
Creatinine, Ser: 0.8 mg/dL (ref 0.44–1.00)
Glucose, Bld: 107 mg/dL — ABNORMAL HIGH (ref 70–99)
HCT: 41 % (ref 36.0–46.0)
Hemoglobin: 13.9 g/dL (ref 12.0–15.0)
Potassium: 3.5 mmol/L (ref 3.5–5.1)
Sodium: 142 mmol/L (ref 135–145)
TCO2: 26 mmol/L (ref 22–32)

## 2020-08-22 LAB — CBC WITH DIFFERENTIAL/PLATELET
Abs Immature Granulocytes: 0.01 10*3/uL (ref 0.00–0.07)
Basophils Absolute: 0 10*3/uL (ref 0.0–0.1)
Basophils Relative: 0 %
Eosinophils Absolute: 0 10*3/uL (ref 0.0–0.5)
Eosinophils Relative: 1 %
HCT: 41.2 % (ref 36.0–46.0)
Hemoglobin: 13.2 g/dL (ref 12.0–15.0)
Immature Granulocytes: 0 %
Lymphocytes Relative: 30 %
Lymphs Abs: 1.8 10*3/uL (ref 0.7–4.0)
MCH: 28.9 pg (ref 26.0–34.0)
MCHC: 32 g/dL (ref 30.0–36.0)
MCV: 90.2 fL (ref 80.0–100.0)
Monocytes Absolute: 0.5 10*3/uL (ref 0.1–1.0)
Monocytes Relative: 8 %
Neutro Abs: 3.7 10*3/uL (ref 1.7–7.7)
Neutrophils Relative %: 61 %
Platelets: 196 10*3/uL (ref 150–400)
RBC: 4.57 MIL/uL (ref 3.87–5.11)
RDW: 12.4 % (ref 11.5–15.5)
WBC: 6 10*3/uL (ref 4.0–10.5)
nRBC: 0 % (ref 0.0–0.2)

## 2020-08-22 LAB — COMPREHENSIVE METABOLIC PANEL
ALT: 21 U/L (ref 0–44)
AST: 25 U/L (ref 15–41)
Albumin: 3.9 g/dL (ref 3.5–5.0)
Alkaline Phosphatase: 41 U/L (ref 38–126)
Anion gap: 9 (ref 5–15)
BUN: 12 mg/dL (ref 6–20)
CO2: 26 mmol/L (ref 22–32)
Calcium: 8.6 mg/dL — ABNORMAL LOW (ref 8.9–10.3)
Chloride: 104 mmol/L (ref 98–111)
Creatinine, Ser: 0.76 mg/dL (ref 0.44–1.00)
GFR calc Af Amer: >60 mL/min (ref >60)
GFR calc non Af Amer: >60 mL/min (ref >60)
Glucose, Bld: 112 mg/dL — ABNORMAL HIGH (ref 70–99)
Potassium: 3.4 mmol/L — ABNORMAL LOW (ref 3.5–5.1)
Sodium: 139 mmol/L (ref 135–145)
Total Bilirubin: 0.4 mg/dL (ref 0.3–1.2)
Total Protein: 6.8 g/dL (ref 6.5–8.1)

## 2020-08-22 LAB — LIPASE, BLOOD: Lipase: 28 U/L (ref 11–51)

## 2020-08-22 MED ORDER — SODIUM CHLORIDE 0.9 % IV BOLUS
1000.0000 mL | Freq: Once | INTRAVENOUS | Status: AC
  Administered 2020-08-22: 1000 mL via INTRAVENOUS

## 2020-08-22 MED ORDER — SODIUM CHLORIDE (PF) 0.9 % IJ SOLN
INTRAMUSCULAR | Status: AC
  Filled 2020-08-22: qty 50

## 2020-08-22 MED ORDER — MORPHINE SULFATE (PF) 4 MG/ML IV SOLN
4.0000 mg | Freq: Once | INTRAVENOUS | Status: AC
  Administered 2020-08-22: 4 mg via INTRAVENOUS
  Filled 2020-08-22: qty 1

## 2020-08-22 MED ORDER — IOHEXOL 300 MG/ML  SOLN
100.0000 mL | Freq: Once | INTRAMUSCULAR | Status: AC | PRN
  Administered 2020-08-22: 100 mL via INTRAVENOUS

## 2020-08-22 MED ORDER — MORPHINE SULFATE (PF) 4 MG/ML IV SOLN
INTRAVENOUS | Status: AC
Start: 2020-08-22 — End: 2020-08-22
  Administered 2020-08-22: 4 mg via INTRAVENOUS
  Filled 2020-08-22: qty 1

## 2020-08-22 NOTE — ED Provider Notes (Signed)
[place] DEPT Provider Note   CSN: [id number] Arrival date & time: 08/22/20  1954     History Chief Complaint  Patient presents with  . Fall    Joann Page is a 59 y.o. female history of breast cancer here presenting with fall.  She states that she tripped over a cement earlier today and fell and hit her side.  She had some left flank and rib pain afterwards.  She denies any head injury.  She went to urgent care and had an x-ray that showed possible air under the diaphragm versus stomach bubble and was sent here for further evaluation.  Patient is not taking blood thinners.   The history is provided by the patient.       Past Medical History:  Diagnosis Date  . Anxiety   . 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 Breast cancer ([place]) 07/17/12   at age 5, ER/PR +, hER 2 -  . Cancer ([place]) 07/07/12   Left Breast Inv Ductal Ca.  . Degenerative disk disease 2010   Lumbar spine  . Depression   . GERD (gastroesophageal reflux disease)    hx  . H/O hiatal hernia    small  . Hyperlipidemia   . Knee pain, right   . No pertinent past medical history   . Obesity   . S/P radiation therapy 11/30/12 - 01/12/13   Left Breast / 50 gray in 25 Fractions with boost 10 gray in 5 Fractions  . Status post chemotherapy comp 10/27/12    4 CyclesTaxotere/ Cytoxan  . Status post chemotherapy Comp. 01/12/13   neoadjuvant chemotherapy: 4 Cycles of Taxotere/Cytoxan  . Use of tamoxifen (Nolvadex) 01/18/13    Patient Active Problem List   Diagnosis Date Noted  . Essential hypertension 11/03/2017  . Routine general medical examination at a health care facility 01/02/2016  . Arthritis   . GERD (gastroesophageal reflux disease)   . Breast cancer of lower-outer quadrant of left female breast ([place]) 07/10/2012    Past Surgical History:  Procedure Laterality Date  . ABDOMINAL HYSTERECTOMY  04/2000   partial, fibroids  . BREAST LUMPECTOMY  07/17/12   Left Breast: Invasive  ductal Cracinoma, 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  Marland Kitchen BREATH TEK H PYLORI  12/07/2011   Procedure: BREATH TEK H PYLORI;  Surgeon: Shann Medal, MD;  Location: [facility];  Service: Endoscopy;  Laterality: N/A;  . COLONOSCOPY    . Needle Core Biopsy  06/30/12   Left Breast: 12 O'clock/ Benign Parenchyma     OB History    Gravida  1   Para  1   Term      Preterm      AB      Living        SAB      TAB      Ectopic      Multiple      Live Births           Obstetric Comments  Menarche age 73, Parity  , G54, P5        Family History  Problem Relation Age of Onset  . Colon cancer Father 38  . Alcohol abuse Father   . Colon polyps Brother   . Anxiety disorder Brother   . Depression  Brother   . Breast cancer Sister 32  . Obesity Sister   . Anxiety disorder Sister   . Depression Sister   . Anxiety disorder Mother   . Depression Mother   . Bipolar disorder Mother   . Lymphoma Brother   . Drug abuse Brother   . Anxiety disorder Sister   . Depression Sister   . Bipolar disorder Sister   . Esophageal cancer Neg Hx   . Stomach cancer Neg Hx   . Rectal cancer Neg Hx     Social History   Tobacco Use  . Smoking status: Never Smoker  . Smokeless tobacco: Never Used  Vaping Use  . Vaping Use: Never used  Substance Use Topics  . Alcohol use: No  . Drug use: No    Home Medications Prior to Admission medications   Medication Sig Start Date End Date Taking? Authorizing Provider  cyclobenzaprine (FLEXERIL) 10 MG tablet Take 1 tablet (10 mg total) by mouth 3 (three) times daily as needed for muscle spasms. 07/14/17   Wallene Huh, DPM  hydrochlorothiazide (HYDRODIURIL) 25 MG tablet Take 1 tablet (25 mg total) by mouth daily. 06/05/20   Hoyt Koch, MD  hydrOXYzine (VISTARIL) 25 MG  capsule 2  qhs   1  Prn  q day 11/16/19   Norma Fredrickson, MD  meloxicam (MOBIC) 15 MG tablet Take 1 tablet (15 mg total) by mouth daily. 07/21/17   Wallene Huh, DPM  ondansetron (ZOFRAN) 4 MG tablet Take 1 tablet (4 mg total) by mouth every 8 (eight) hours as needed for nausea or vomiting. 11/21/19   Hoyt Koch, MD  pantoprazole (PROTONIX) 40 MG tablet Take 1 tablet (40 mg total) by mouth daily. 11/21/19   Hoyt Koch, MD  sertraline (ZOLOFT) 100 MG tablet 2  qam 11/16/19   Plovsky, Berneta Sages, MD  tamoxifen (NOLVADEX) 20 MG tablet TAKE 1 TABLET BY MOUTH ONCE DAILY 06/13/20   Nicholas Lose, MD  vitamin B-12 (CYANOCOBALAMIN) 1000 MCG tablet Take 1 tablet (1,000 mcg total) by mouth daily. 12/16/17   Hoyt Koch, MD    Allergies    Sulfa antibiotics  Review of Systems   Review of Systems  Gastrointestinal: Positive for abdominal pain.  Musculoskeletal:       L rib pain  All other systems reviewed and are negative.   Physical Exam Updated Vital Signs BP (!) 150/94 (BP Location: Right Arm)   Pulse (!) 110   Temp 98.3 F (36.8 C) (Oral)   Resp 16   Ht 5\' 6"  (1.676 m)   Wt 88.5 kg   SpO2 100%   BMI 31.47 kg/m   Physical Exam Vitals and nursing note reviewed.  Constitutional:      Comments: Uncomfortable   HENT:     Head: Normocephalic and atraumatic.     Mouth/Throat:     Mouth: Mucous membranes are moist.  Eyes:     Extraocular Movements: Extraocular movements intact.     Pupils: Pupils are equal, round, and reactive to light.  Neck:     Comments: No midline tenderness  Cardiovascular:     Rate and Rhythm: Normal rate and regular rhythm.     Pulses: Normal pulses.     Heart sounds: Normal heart sounds.  Pulmonary:     Effort: Pulmonary effort is normal.     Breath sounds: Normal breath sounds.     Comments: + L lower rib tenderness, no obvious ecchymosis or deformity  Abdominal:     General: Abdomen is flat.     Comments: Mild left upper  quadrant and epigastric tenderness  Musculoskeletal:        General: Normal range of motion.     Cervical back: Normal range of motion and neck supple.     Comments: No extremity trauma   Skin:    General: Skin is warm.     Capillary Refill: Capillary refill takes less than 2 seconds.  Neurological:     General: No focal deficit present.     Mental Status: She is alert and oriented to person, place, and time.  Psychiatric:        Mood and Affect: Mood normal.        Behavior: Behavior normal.     ED Results / Procedures / Treatments   Labs (all labs ordered are listed, but only abnormal results are displayed) Labs Reviewed  I-STAT CHEM 8, ED - Abnormal; Notable for the following components:      Result Value   Glucose, Bld 107 (*)    Calcium, Ion 1.11 (*)    All other components within normal limits  CBC WITH DIFFERENTIAL/PLATELET  COMPREHENSIVE METABOLIC PANEL  LIPASE, BLOOD    EKG None  Radiology DG Ribs Unilateral W/Chest Left  Result Date: 08/22/2020 CLINICAL DATA:  Left rib pain after a fall. EXAM: LEFT RIBS AND CHEST - 3+ VIEW COMPARISON:  None. FINDINGS: Shallow inspiration. Normal heart size and pulmonary vascularity. No focal airspace disease or consolidation in the lungs. No blunting of costophrenic angles. No pneumothorax. Mediastinal contours appear intact. Acute minimally displaced fractures of the left seventh and eighth ribs laterally. No focal bone lesion or bone destruction. Soft tissues are unremarkable. IMPRESSION: Acute fractures of the left seventh and eighth ribs. No evidence of active pulmonary disease. Electronically Signed   By: Lucienne Capers M.D.   On: 08/22/2020 21:25    Procedures Procedures (including critical care time)  Medications Ordered in ED Medications  morphine 4 MG/ML injection (has no administration in time range)  morphine 4 MG/ML injection 4 mg (4 mg Intravenous Given 08/22/20 2137)  sodium chloride 0.9 % bolus 1,000 mL (1,000  mLs Intravenous New Bag/Given 08/22/20 2154)    ED Course  I have reviewed the triage vital signs and the nursing notes.  Pertinent labs & imaging results that were available during my care of the patient were reviewed by me and considered in my medical decision making (see chart for details).    MDM Rules/Calculators/A&P                          Joann Page is a 59 y.o. female here presenting with left rib pain and abdominal pain.  Patient has mechanical fall and hit the left side of her ribs.  Outpatient x-ray showed possible air under the diaphragm versus stomach bubble.  Will repeat x-rays.  Will likely need CT chest/ab/pel   10:00 PM Xray showed no free air but there are two rib fractures. Trauma scan pending.   10:59 PM CT pending. Signed out to Dr. Sedonia Small. Anticipate dc home if pain is controlled and no pneumothorax or hemothorax or intra abdominal bleeding   Final Clinical Impression(s) / ED Diagnoses Final diagnoses:  None    Rx / DC Orders ED Discharge Orders    None       Drenda Freeze, MD 08/22/20 2259

## 2020-08-22 NOTE — ED Triage Notes (Signed)
Patient is complaining of left rib pain from tripping over some cement. Patient states she did not hit her head. Patient went to see pcp and they did xrays. Patient has a cd with her xrays with her. Patient states md seen some air in her abdomen and wants her to be seen.

## 2020-08-23 MED ORDER — LIDOCAINE 5 % EX PTCH: 1.0000 | MEDICATED_PATCH | CUTANEOUS | 0 refills | Status: DC

## 2020-08-23 MED ORDER — OXYCODONE HCL 5 MG PO TABS: 5.0000 mg | ORAL_TABLET | ORAL | 0 refills | Status: DC | PRN

## 2020-08-23 NOTE — ED Notes (Signed)
Patient discharged with AVS and no questions at this. Left in wheelchair.

## 2020-08-23 NOTE — Discharge Instructions (Addendum)
You were evaluated in the Emergency Department and after careful evaluation, we did not find any emergent condition requiring admission or further testing in the hospital.  Your exam/testing today was overall reassuring.  Your pain seems to be due to broken ribs.  As discussed, it is important that you take deep breaths throughout the day.  Please use Tylenol 1000 mg every 4-6 hours and/or Motrin 600 mg every 4 6 hours for your pain.  We also recommend using the lidocaine patches.  For more significant pain, you can use the oxycodone tablets provided.  Please return to the Emergency Department if you experience any worsening of your condition.  Thank you for allowing [place] to be a part of your care.

## 2020-08-23 NOTE — ED Provider Notes (Signed)
  Provider Note MRN:  [id number]  Arrival date & time: 08/23/20    ED Course and Medical Decision Making  Assumed care from Dr. Darl Householder at shift change.  CT imaging is reassuring, no other injuries other than broken ribs.  Patient's pain is adequately controlled, appropriate for discharge with pain regimen.  Procedures  Final Clinical Impressions(s) / ED Diagnoses     ICD-10-CM   1. Closed fracture of multiple ribs, unspecified laterality, initial encounter  S22.49XA     ED Discharge Orders         Ordered    oxyCODONE (ROXICODONE) 5 MG immediate release tablet  Every 4 hours PRN        08/23/20 0009    lidocaine (LIDODERM) 5 %  Every 24 hours        08/23/20 0009            Discharge Instructions     You were evaluated in the Emergency Department and after careful evaluation, we did not find any emergent condition requiring admission or further testing in the hospital.  Your exam/testing today was overall reassuring.  Your pain seems to be due to broken ribs.  As discussed, it is important that you take deep breaths throughout the day.  Please use Tylenol 1000 mg every 4-6 hours and/or Motrin 600 mg every 4 6 hours for your pain.  We also recommend using the lidocaine patches.  For more significant pain, you can use the oxycodone tablets provided.  Please return to the Emergency Department if you experience any worsening of your condition.  Thank you for allowing [place] to be a part of your care.     Barth Kirks. Sedonia Small, [place] [email or web address]    Maudie Flakes, MD 08/23/20 Berniece Salines

## 2020-08-25 ENCOUNTER — Telehealth: Payer: Self-pay | Admitting: Internal Medicine

## 2020-08-25 NOTE — Telephone Encounter (Signed)
Patient had a fall Friday 9/24 and broke some ribs. She went to the hospital for the injury. She states they gave her pain medication, But she wants to know if she should be on a steroid pack.   Refused an appointment at this time. Please follow up with patient.  Patient aware Dr.Crawford is out of office, Asked if message could be sent to another provider.

## 2020-08-26 NOTE — Telephone Encounter (Signed)
Prednisone will hurt the ability of her ribs to heal.  I will not prescribe it.  She needs to make an appointment to be seen if not doing well. Thanks

## 2020-08-26 NOTE — Telephone Encounter (Signed)
Detailed VM left instructing pt of Dr Plotnikov's response, to continue d/c instructions rec'd upon hospital d/c & to call office to schedule an appt if needed.

## 2020-09-02 ENCOUNTER — Other Ambulatory Visit (HOSPITAL_COMMUNITY): Payer: Self-pay | Admitting: Sports Medicine

## 2020-09-02 MED FILL — HYDROCODON-APAP 10-325: 10-325 | 30 days supply | Qty: 60 | Fill #0

## 2020-09-02 MED FILL — HYDROCHLOROTHIAZIDE 25 MG T: 25 | 30 days supply | Qty: 30 | Fill #3

## 2020-09-19 MED FILL — PANTOPRAZOLE SOD DR 40 MG T: 40 | 30 days supply | Qty: 30 | Fill #10

## 2020-10-01 ENCOUNTER — Other Ambulatory Visit (HOSPITAL_COMMUNITY): Payer: Self-pay | Admitting: Sports Medicine

## 2020-10-01 MED FILL — CYCLOBENZAPRINE HCL 10 MG T: 10 | 30 days supply | Qty: 90 | Fill #0

## 2020-10-01 MED FILL — HYDROCODON-APAP 10-325: 10-325 | 30 days supply | Qty: 60 | Fill #0

## 2020-10-20 MED FILL — PANTOPRAZOLE SOD DR 40 MG T: 40 | 30 days supply | Qty: 30 | Fill #11

## 2020-10-20 MED FILL — HYDROCHLOROTHIAZIDE 25 MG T: 25 | 30 days supply | Qty: 30 | Fill #4

## 2020-10-27 ENCOUNTER — Other Ambulatory Visit (HOSPITAL_COMMUNITY): Payer: Self-pay | Admitting: Sports Medicine

## 2020-10-27 MED FILL — HYDROCODON-APAP 10-325: 10-325 | 30 days supply | Qty: 60 | Fill #0

## 2020-11-18 ENCOUNTER — Telehealth: Payer: Self-pay | Admitting: Internal Medicine

## 2020-11-18 MED ORDER — PANTOPRAZOLE SODIUM 40 MG PO TBEC
40.0000 mg | DELAYED_RELEASE_TABLET | Freq: Every day | ORAL | 1 refills | Status: DC
Start: 2020-11-18 — End: 2021-04-13

## 2020-11-18 MED ORDER — HYDROCHLOROTHIAZIDE 25 MG PO TABS
25.0000 mg | ORAL_TABLET | Freq: Every day | ORAL | 1 refills | Status: DC
Start: 2020-11-18 — End: 2021-08-04

## 2020-11-18 NOTE — Telephone Encounter (Signed)
1.Medication Requested: hydrochlorothiazide (HYDRODIURIL) 25 MG tablet  pantoprazole (PROTONIX) 40 MG tablet    2. Pharmacy ([place]): [facility] [address] [place], [place] [address]  3. On Med List: yes   4. Last Visit with PCP: 7.8.21  5. Next visit date with PCP: n/a    Patient states she is out of both medications.    Agent: Please be advised that RX refills may take up to 3 business days. We ask that you follow-up with your pharmacy.

## 2020-11-18 NOTE — Telephone Encounter (Signed)
Refills sent. See meds.  

## 2020-11-24 ENCOUNTER — Other Ambulatory Visit (HOSPITAL_COMMUNITY): Payer: Self-pay | Admitting: Physician Assistant

## 2020-11-24 MED FILL — HYDROCODON-APAP 10-325: 10-325 | 30 days supply | Qty: 60 | Fill #0

## 2020-12-04 ENCOUNTER — Telehealth: Payer: Self-pay | Admitting: Internal Medicine

## 2020-12-04 NOTE — Telephone Encounter (Signed)
This was prescribed acutely about 1 year ago. Is she ill currently to have nausea? Also due for BP follow up as we increased her medication in July and she has not followed up since then.

## 2020-12-04 NOTE — Telephone Encounter (Signed)
Patient wondering if we can send her in the generic phenergan for nausea instead of zofran because it is not helping  [facility] - [place], [place] - [address] [place] Phone:  [phone]  Fax:  [phone]

## 2020-12-05 ENCOUNTER — Other Ambulatory Visit: Payer: Self-pay | Admitting: Internal Medicine

## 2020-12-05 MED ORDER — PROMETHAZINE HCL 25 MG PO TABS: 25.0000 mg | ORAL_TABLET | Freq: Three times a day (TID) | ORAL | 0 refills | Status: DC | PRN

## 2020-12-05 MED FILL — PROMETHAZINE 25 MG TABLET: 25 | 6 days supply | Qty: 20 | Fill #0

## 2020-12-05 NOTE — Addendum Note (Signed)
Addended by: Pricilla Holm A on: 12/05/2020 10:54 AM   Modules accepted: Orders

## 2020-12-05 NOTE — Telephone Encounter (Signed)
Pt informed of below.  She states intermittent nausea started approx 1 week ago. She doesn't know if her pantoprazole is no longer working, but Zofran is not helping. She denies any other sxs. She is still requesting promethazine.   OV scheduled 12/15/20 @ 2:20 w/ PCP.

## 2020-12-05 NOTE — Telephone Encounter (Signed)
Pt informed of below.  

## 2020-12-05 NOTE — Telephone Encounter (Signed)
Sent in promethazine but should consider covid-19 testing as nausea can be a symptom of this. Okay to keep visit for later this month.

## 2020-12-15 ENCOUNTER — Ambulatory Visit: Payer: BC Managed Care – PPO | Admitting: Internal Medicine

## 2020-12-16 ENCOUNTER — Ambulatory Visit: Payer: Self-pay | Admitting: Internal Medicine

## 2020-12-19 ENCOUNTER — Ambulatory Visit: Payer: Self-pay | Admitting: Internal Medicine

## 2020-12-22 ENCOUNTER — Other Ambulatory Visit (HOSPITAL_COMMUNITY): Payer: Self-pay | Admitting: Sports Medicine

## 2020-12-22 MED FILL — CYCLOBENZAPRINE HCL 10 MG T: 10 | 30 days supply | Qty: 90 | Fill #0

## 2020-12-22 MED FILL — HYDROCODON-APAP 10-325: 10-325 | 30 days supply | Qty: 60 | Fill #0

## 2021-01-19 ENCOUNTER — Other Ambulatory Visit (HOSPITAL_COMMUNITY): Payer: Self-pay | Admitting: Sports Medicine

## 2021-01-19 MED FILL — HYDROCODON-APAP 10-325: 10-325 | 30 days supply | Qty: 60 | Fill #0

## 2021-01-29 ENCOUNTER — Encounter: Payer: 59 | Admitting: Internal Medicine

## 2021-02-16 ENCOUNTER — Other Ambulatory Visit (HOSPITAL_COMMUNITY): Payer: Self-pay | Admitting: Sports Medicine

## 2021-02-16 MED FILL — HYDROCODON-APAP 10-325: 10-325 | 30 days supply | Qty: 60 | Fill #0

## 2021-03-10 ENCOUNTER — Encounter: Payer: 59 | Admitting: Internal Medicine

## 2021-03-12 ENCOUNTER — Encounter: Payer: 59 | Admitting: Internal Medicine

## 2021-03-16 ENCOUNTER — Other Ambulatory Visit (HOSPITAL_COMMUNITY): Payer: Self-pay

## 2021-03-16 MED ORDER — HYDROCODONE-ACETAMINOPHEN 10-325 MG PO TABS
1.0000 | ORAL_TABLET | Freq: Two times a day (BID) | ORAL | 0 refills | Status: DC | PRN
Start: 2021-03-16 — End: 2021-08-19
  Filled 2021-03-16: qty 60, 30d supply, fill #0

## 2021-03-17 ENCOUNTER — Other Ambulatory Visit: Payer: Self-pay

## 2021-03-17 ENCOUNTER — Encounter: Payer: Self-pay | Admitting: Internal Medicine

## 2021-03-17 ENCOUNTER — Ambulatory Visit (INDEPENDENT_AMBULATORY_CARE_PROVIDER_SITE_OTHER): Payer: 59 | Admitting: Internal Medicine

## 2021-03-17 VITALS — BP 122/72 | HR 92 | Temp 98.3°F | Resp 18 | Ht 66.0 in | Wt 209.8 lb

## 2021-03-17 DIAGNOSIS — Z0000 Encounter for general adult medical examination without abnormal findings: Secondary | ICD-10-CM

## 2021-03-17 DIAGNOSIS — Z86010 Personal history of colonic polyps: Secondary | ICD-10-CM | POA: Diagnosis not present

## 2021-03-17 DIAGNOSIS — Z170 Estrogen receptor positive status [ER+]: Secondary | ICD-10-CM

## 2021-03-17 DIAGNOSIS — I700 Atherosclerosis of aorta: Secondary | ICD-10-CM

## 2021-03-17 DIAGNOSIS — I10 Essential (primary) hypertension: Secondary | ICD-10-CM | POA: Diagnosis not present

## 2021-03-17 DIAGNOSIS — K219 Gastro-esophageal reflux disease without esophagitis: Secondary | ICD-10-CM

## 2021-03-17 DIAGNOSIS — C50512 Malignant neoplasm of lower-outer quadrant of left female breast: Secondary | ICD-10-CM

## 2021-03-17 NOTE — Patient Instructions (Addendum)
Make sure to eat breakfast.   Health Maintenance, Female Adopting a healthy lifestyle and getting preventive care are important in promoting health and wellness. Ask your health care provider about:  The right schedule for you to have regular tests and exams.  Things you can do on your own to prevent diseases and keep yourself healthy. What should I know about diet, weight, and exercise? Eat a healthy diet  Eat a diet that includes plenty of vegetables, fruits, low-fat dairy products, and lean protein.  Do not eat a lot of foods that are high in solid fats, added sugars, or sodium.   Maintain a healthy weight Body mass index (BMI) is used to identify weight problems. It estimates body fat based on height and weight. Your health care provider can help determine your BMI and help you achieve or maintain a healthy weight. Get regular exercise Get regular exercise. This is one of the most important things you can do for your health. Most adults should:  Exercise for at least 150 minutes each week. The exercise should increase your heart rate and make you sweat (moderate-intensity exercise).  Do strengthening exercises at least twice a week. This is in addition to the moderate-intensity exercise.  Spend less time sitting. Even light physical activity can be beneficial. Watch cholesterol and blood lipids Have your blood tested for lipids and cholesterol at 58 years of age, then have this test every 5 years. Have your cholesterol levels checked more often if:  Your lipid or cholesterol levels are high.  You are older than 58 years of age.  You are at high risk for heart disease. What should I know about cancer screening? Depending on your health history and family history, you may need to have cancer screening at various ages. This may include screening for:  Breast cancer.  Cervical cancer.  Colorectal cancer.  Skin cancer.  Lung cancer. What should I know about heart disease,  diabetes, and high blood pressure? Blood pressure and heart disease  High blood pressure causes heart disease and increases the risk of stroke. This is more likely to develop in people who have high blood pressure readings, are of African descent, or are overweight.  Have your blood pressure checked: ? Every 3-5 years if you are 69-66 years of age. ? Every year if you are 72 years old or older. Diabetes Have regular diabetes screenings. This checks your fasting blood sugar level. Have the screening done:  Once every three years after age 97 if you are at a normal weight and have a low risk for diabetes.  More often and at a younger age if you are overweight or have a high risk for diabetes. What should I know about preventing infection? Hepatitis B If you have a higher risk for hepatitis B, you should be screened for this virus. Talk with your health care provider to find out if you are at risk for hepatitis B infection. Hepatitis C Testing is recommended for:  Everyone born from [birth year] through 1963.  Anyone with known risk factors for hepatitis C. Sexually transmitted infections (STIs)  Get screened for STIs, including gonorrhea and chlamydia, if: ? You are sexually active and are younger than 58 years of age. ? You are older than 58 years of age and your health care provider tells you that you are at risk for this type of infection. ? Your sexual activity has changed since you were last screened, and you are at increased risk for chlamydia  or gonorrhea. Ask your health care provider if you are at risk.  Ask your health care provider about whether you are at high risk for HIV. Your health care provider may recommend a prescription medicine to help prevent HIV infection. If you choose to take medicine to prevent HIV, you should first get tested for HIV. You should then be tested every 3 months for as long as you are taking the medicine. Pregnancy  If you are about to stop having your  period (premenopausal) and you may become pregnant, seek counseling before you get pregnant.  Take 400 to 800 micrograms (mcg) of folic acid every day if you become pregnant.  Ask for birth control (contraception) if you want to prevent pregnancy. Osteoporosis and menopause Osteoporosis is a disease in which the bones lose minerals and strength with aging. This can result in bone fractures. If you are 61 years old or older, or if you are at risk for osteoporosis and fractures, ask your health care provider if you should:  Be screened for bone loss.  Take a calcium or vitamin D supplement to lower your risk of fractures.  Be given hormone replacement therapy (HRT) to treat symptoms of menopause. Follow these instructions at home: Lifestyle  Do not use any products that contain nicotine or tobacco, such as cigarettes, e-cigarettes, and chewing tobacco. If you need help quitting, ask your health care provider.  Do not use street drugs.  Do not share needles.  Ask your health care provider for help if you need support or information about quitting drugs. Alcohol use  Do not drink alcohol if: ? Your health care provider tells you not to drink. ? You are pregnant, may be pregnant, or are planning to become pregnant.  If you drink alcohol: ? Limit how much you use to 0-1 drink a day. ? Limit intake if you are breastfeeding.  Be aware of how much alcohol is in your drink. In the [place] one drink equals one 12 oz bottle of beer (355 mL), one 5 oz glass of wine (148 mL), or one 1 oz glass of hard liquor (44 mL). General instructions  Schedule regular health, dental, and eye exams.  Stay current with your vaccines.  Tell your health care provider if: ? You often feel depressed. ? You have ever been abused or do not feel safe at home. Summary  Adopting a healthy lifestyle and getting preventive care are important in promoting health and wellness.  Follow your health care provider's  instructions about healthy diet, exercising, and getting tested or screened for diseases.  Follow your health care provider's instructions on monitoring your cholesterol and blood pressure. This information is not intended to replace advice given to you by your health care provider. Make sure you discuss any questions you have with your health care provider. Document Revised: 11/08/2018 Document Reviewed: 11/08/2018 Elsevier Patient Education  2021 [employer].

## 2021-03-17 NOTE — Progress Notes (Signed)
   Subjective:   Patient ID: Joann Page, female    DOB: May 04, 1961, 60 y.o.   MRN: [id number]  HPI The patient is a 60 YO female coming in for physical.   PMH, [place], social history reviewed and updated  Review of Systems  Constitutional: Negative.   HENT: Negative.   Eyes: Negative.   Respiratory: Negative for cough, chest tightness and shortness of breath.   Cardiovascular: Negative for chest pain, palpitations and leg swelling.  Gastrointestinal: Negative for abdominal distention, abdominal pain, constipation, diarrhea, nausea and vomiting.  Musculoskeletal: Negative.   Skin: Negative.   Neurological: Negative.   Psychiatric/Behavioral: Negative.     Objective:  Physical Exam Constitutional:      Appearance: She is well-developed.  HENT:     Head: Normocephalic and atraumatic.  Cardiovascular:     Rate and Rhythm: Normal rate and regular rhythm.  Pulmonary:     Effort: Pulmonary effort is normal. No respiratory distress.     Breath sounds: Normal breath sounds. No wheezing or rales.  Abdominal:     General: Bowel sounds are normal. There is no distension.     Palpations: Abdomen is soft.     Tenderness: There is no abdominal tenderness. There is no rebound.  Musculoskeletal:     Cervical back: Normal range of motion.  Skin:    General: Skin is warm and dry.  Neurological:     Mental Status: She is alert and oriented to person, place, and time.     Coordination: Coordination normal.     Vitals:   03/17/21 1548  BP: 122/72  Pulse: 92  Resp: 18  Temp: 98.3 F (36.8 C)  TempSrc: Oral  SpO2: 99%  Weight: 209 lb 12.8 oz (95.2 kg)  Height: 5\' 6"  (1.676 m)   This visit occurred during the SARS-CoV-2 public health emergency.  Safety protocols were in place, including screening questions prior to the visit, additional usage of staff PPE, and extensive cleaning of exam room while observing appropriate contact time as indicated for disinfecting solutions.    Assessment & Plan:

## 2021-03-19 DIAGNOSIS — I700 Atherosclerosis of aorta: Secondary | ICD-10-CM | POA: Insufficient documentation

## 2021-03-19 NOTE — Assessment & Plan Note (Signed)
Flu shot counseled. Covid-19 counseled up to date. Shingrix complete. Tetanus up to date. Colonoscopy due this fall referral to GI done. Mammogram up to date, pap smear up to date. Counseled about sun safety and mole surveillance. Counseled about the dangers of distracted driving. Given 10 year screening recommendations.

## 2021-03-19 NOTE — Assessment & Plan Note (Signed)
BP at goal since change to 25 mg hctz. Checking CMP and adjust as needed.

## 2021-03-19 NOTE — Assessment & Plan Note (Signed)
Found on imaging from trauma Sept 2021. Checking lipid panel and depending on results may need cholesterol medication.

## 2021-03-19 NOTE — Assessment & Plan Note (Signed)
Taking protonix daily.

## 2021-03-19 NOTE — Assessment & Plan Note (Signed)
On tamoxifen still.

## 2021-03-23 ENCOUNTER — Other Ambulatory Visit: Payer: Self-pay | Admitting: Internal Medicine

## 2021-03-23 ENCOUNTER — Other Ambulatory Visit (INDEPENDENT_AMBULATORY_CARE_PROVIDER_SITE_OTHER): Payer: 59

## 2021-03-23 DIAGNOSIS — I10 Essential (primary) hypertension: Secondary | ICD-10-CM

## 2021-03-23 LAB — CBC
HCT: 40 % (ref 36.0–46.0)
Hemoglobin: 13.6 g/dL (ref 12.0–15.0)
MCHC: 34.1 g/dL (ref 30.0–36.0)
MCV: 86.2 fl (ref 78.0–100.0)
Platelets: 223 10*3/uL (ref 150.0–400.0)
RBC: 4.64 Mil/uL (ref 3.87–5.11)
RDW: 12.6 % (ref 11.5–15.5)
WBC: 3 10*3/uL — ABNORMAL LOW (ref 4.0–10.5)

## 2021-03-23 LAB — COMPREHENSIVE METABOLIC PANEL
ALT: 26 U/L (ref 0–35)
AST: 26 U/L (ref 0–37)
Albumin: 3.9 g/dL (ref 3.5–5.2)
Alkaline Phosphatase: 57 U/L (ref 39–117)
BUN: 10 mg/dL (ref 6–23)
CO2: 26 mEq/L (ref 19–32)
Calcium: 9.5 mg/dL (ref 8.4–10.5)
Chloride: 104 mEq/L (ref 96–112)
Creatinine, Ser: 0.84 mg/dL (ref 0.40–1.20)
GFR: 76.71 mL/min (ref >60.00)
Glucose, Bld: 101 mg/dL — ABNORMAL HIGH (ref 70–99)
Potassium: 3.5 mEq/L (ref 3.5–5.1)
Sodium: 140 mEq/L (ref 135–145)
Total Bilirubin: 0.6 mg/dL (ref 0.2–1.2)
Total Protein: 6.9 g/dL (ref 6.0–8.3)

## 2021-03-23 LAB — LIPID PANEL
Cholesterol: 246 mg/dL — ABNORMAL HIGH (ref 0–200)
HDL: 59.6 mg/dL (ref >39.00)
LDL Cholesterol: 164 mg/dL — ABNORMAL HIGH (ref 0–99)
NonHDL: 186.61
Total CHOL/HDL Ratio: 4
Triglycerides: 111 mg/dL (ref 0.0–149.0)
VLDL: 22.2 mg/dL (ref 0.0–40.0)

## 2021-03-23 LAB — HEMOGLOBIN A1C: Hgb A1c MFr Bld: 5.8 % (ref 4.6–6.5)

## 2021-03-24 ENCOUNTER — Telehealth: Payer: Self-pay | Admitting: Internal Medicine

## 2021-03-24 NOTE — Telephone Encounter (Signed)
See result note.  

## 2021-03-24 NOTE — Telephone Encounter (Signed)
    Please call patient to discuss lab results 

## 2021-04-03 NOTE — Telephone Encounter (Signed)
Unable to get in contact with the patient. LVM with patient's results. Patient also viewed results via mychart. Office number was provided.

## 2021-04-03 NOTE — Telephone Encounter (Signed)
    Please call patient to discuss lab results 

## 2021-04-12 ENCOUNTER — Other Ambulatory Visit: Payer: Self-pay | Admitting: Internal Medicine

## 2021-04-13 ENCOUNTER — Other Ambulatory Visit (HOSPITAL_COMMUNITY): Payer: Self-pay

## 2021-04-13 MED ORDER — HYDROCODONE-ACETAMINOPHEN 10-325 MG PO TABS
1.0000 | ORAL_TABLET | Freq: Two times a day (BID) | ORAL | 0 refills | Status: DC | PRN
Start: 2021-04-13 — End: 2021-08-19
  Filled 2021-04-13: qty 60, 30d supply, fill #0

## 2021-04-29 NOTE — Telephone Encounter (Signed)
   Please call patient to discuss lab results. She is upset that her cholesterol is elevated. Wants to discuss medication options. Patient requesting return call today

## 2021-04-29 NOTE — Telephone Encounter (Signed)
Patient would like to know what are her medication options? Please advise

## 2021-04-29 NOTE — Telephone Encounter (Signed)
Patient called back and stated that she wants to talk specifically with Dr. Sharlet Salina because she has additional questions/concerns in regards to her cholesterol and white blood cell count.

## 2021-04-30 NOTE — Telephone Encounter (Signed)
Spoke with the patient and she is agreeable to starting pravastatin for her cholesterol. No other questions or concerns at this time.

## 2021-04-30 NOTE — Telephone Encounter (Signed)
Pravastatin typically has minimal side effects. Some people notice mild stomach upset which typically resolves within 1-2 weeks of starting. Cholesterol medication in general is lifelong or until lifestyle changes can be made sometimes can be stopped. The white blood count is slightly low but not a concern we will recheck with next labs. I would recommend follow up 3 months after starting cholesterol medication or 6 months for recheck if not willing to start cholesterol medication. Any further questions can be directed back to me for advice or if she is wanting to talk more in depth we can do virtual visit.

## 2021-04-30 NOTE — Telephone Encounter (Signed)
Spoke with the patient and Joann Page stated that Joann Page would like to talk with Dr. Sharlet Salina directly due to her having questions about the side effects of the medication and how long Joann Page will need to take it. Joann Page would also like to discuss her white blood cell count. Please advise

## 2021-04-30 NOTE — Telephone Encounter (Signed)
Pravastatin would be the cholesterol medication.

## 2021-05-01 MED ORDER — PRAVASTATIN SODIUM 20 MG PO TABS: 20.0000 mg | ORAL_TABLET | Freq: Every day | ORAL | 3 refills | Status: DC

## 2021-05-01 NOTE — Addendum Note (Signed)
Addended by: Pricilla Holm A on: 05/01/2021 07:13 AM   Modules accepted: Orders

## 2021-05-11 ENCOUNTER — Other Ambulatory Visit (HOSPITAL_COMMUNITY): Payer: Self-pay

## 2021-05-11 MED ORDER — CYCLOBENZAPRINE HCL 10 MG PO TABS
10.0000 mg | ORAL_TABLET | ORAL | 2 refills | Status: DC
Start: 2021-05-11 — End: 2021-08-19
  Filled 2021-05-11: qty 90, 30d supply, fill #0

## 2021-05-11 MED ORDER — HYDROCODONE-ACETAMINOPHEN 10-325 MG PO TABS
1.0000 | ORAL_TABLET | Freq: Two times a day (BID) | ORAL | 0 refills | Status: DC | PRN
Start: 2021-05-11 — End: 2021-08-19
  Filled 2021-05-11: qty 60, 30d supply, fill #0

## 2021-05-12 ENCOUNTER — Telehealth: Payer: Self-pay | Admitting: Internal Medicine

## 2021-05-12 NOTE — Telephone Encounter (Signed)
Returned call to pt who stated that her pharmacy will change to [facility] in July; please send new prescription to [facility] beginning in July.  Pharmacy noted in file.

## 2021-05-12 NOTE — Telephone Encounter (Signed)
Patient said that in July she will be using [facility]. She said that she wanted to inform Dr. Sharlet Salina. She is requesting a call back. Please advise

## 2021-06-04 ENCOUNTER — Other Ambulatory Visit (HOSPITAL_COMMUNITY): Payer: Self-pay

## 2021-06-04 MED ORDER — HYDROCODONE-ACETAMINOPHEN 10-325 MG PO TABS
1.0000 | ORAL_TABLET | Freq: Two times a day (BID) | ORAL | 0 refills | Status: DC | PRN
Start: 2021-06-04 — End: 2021-08-19
  Filled 2021-06-04 – 2021-06-08 (×3): qty 60, 30d supply, fill #0

## 2021-06-08 ENCOUNTER — Other Ambulatory Visit (HOSPITAL_COMMUNITY): Payer: Self-pay

## 2021-06-16 ENCOUNTER — Other Ambulatory Visit (HOSPITAL_COMMUNITY): Payer: Self-pay

## 2021-06-16 ENCOUNTER — Telehealth: Payer: Self-pay | Admitting: Internal Medicine

## 2021-06-16 MED ORDER — ESTRADIOL 0.1 MG/GM VA CREA
TOPICAL_CREAM | VAGINAL | 1 refills | Status: DC
  Filled 2021-06-16: qty 42.5, 30d supply, fill #0

## 2021-06-16 NOTE — Telephone Encounter (Signed)
   Patient calling to request refill for pantoprazole (PROTONIX) 40 MG tablet   Pharmacy [facility]

## 2021-06-17 ENCOUNTER — Other Ambulatory Visit (HOSPITAL_COMMUNITY): Payer: Self-pay

## 2021-06-17 MED ORDER — PANTOPRAZOLE SODIUM 40 MG PO TBEC
40.0000 mg | DELAYED_RELEASE_TABLET | Freq: Every day | ORAL | 1 refills | Status: DC
  Filled 2021-06-17: qty 30, 30d supply, fill #0

## 2021-06-17 NOTE — Telephone Encounter (Signed)
Medication has been sent to the patient's pharmacy.  

## 2021-06-17 NOTE — Telephone Encounter (Signed)
Please patient call patient once refill sent to pharmacy Patient has no medication remaining

## 2021-06-18 ENCOUNTER — Ambulatory Visit: Payer: BC Managed Care – PPO | Admitting: Hematology and Oncology

## 2021-06-18 NOTE — Assessment & Plan Note (Deleted)
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 hot flashesmostly resolved. Vaginal dryness: She is experiencing vaginal dryness in spite of using Replens.  I discussed with her about talking with her gynecologist to see if topical estrogens can be used for the vaginal dryness.  I also talked to her about Josph Macho touch.  Breast Cancer Surveillance: 1. Breast exam:06/18/21: Benign 2. [facility] benign breast density category A CT CAP 08/22/2020: Left seventh rib fractures, degenerative changes L4-L5 no evidence of metastatic disease.  Exercising and watching her diet: Her weight appears to be up and down based upon how she exercises and how she eats. Return to clinic in 1 year for follow-up

## 2021-06-27 ENCOUNTER — Other Ambulatory Visit (HOSPITAL_COMMUNITY): Payer: Self-pay

## 2021-07-01 ENCOUNTER — Encounter: Payer: Self-pay | Admitting: Gastroenterology

## 2021-07-06 ENCOUNTER — Other Ambulatory Visit (HOSPITAL_COMMUNITY): Payer: Self-pay

## 2021-07-06 MED ORDER — HYDROCODONE-ACETAMINOPHEN 10-325 MG PO TABS
1.0000 | ORAL_TABLET | Freq: Two times a day (BID) | ORAL | 0 refills | Status: DC | PRN
Start: 2021-07-06 — End: 2021-08-19
  Filled 2021-07-06: qty 60, 30d supply, fill #0

## 2021-07-31 ENCOUNTER — Other Ambulatory Visit (HOSPITAL_COMMUNITY): Payer: Self-pay

## 2021-07-31 MED ORDER — HYDROCODONE-ACETAMINOPHEN 10-325 MG PO TABS
1.0000 | ORAL_TABLET | Freq: Two times a day (BID) | ORAL | 0 refills | Status: DC | PRN
Start: 2021-07-31 — End: 2021-08-28
  Filled 2021-07-31 – 2021-08-04 (×2): qty 60, 30d supply, fill #0

## 2021-08-04 ENCOUNTER — Other Ambulatory Visit (HOSPITAL_COMMUNITY): Payer: Self-pay

## 2021-08-04 ENCOUNTER — Other Ambulatory Visit: Payer: Self-pay | Admitting: Internal Medicine

## 2021-08-19 ENCOUNTER — Encounter: Payer: Self-pay | Admitting: Gastroenterology

## 2021-08-19 ENCOUNTER — Other Ambulatory Visit: Payer: Self-pay

## 2021-08-19 ENCOUNTER — Ambulatory Visit (AMBULATORY_SURGERY_CENTER): Payer: 59 | Admitting: *Deleted

## 2021-08-19 ENCOUNTER — Other Ambulatory Visit (HOSPITAL_COMMUNITY): Payer: Self-pay

## 2021-08-19 VITALS — Ht 66.0 in | Wt 210.0 lb

## 2021-08-19 DIAGNOSIS — Z800 Family history of malignant neoplasm of digestive organs: Secondary | ICD-10-CM

## 2021-08-19 MED ORDER — NA SULFATE-K SULFATE-MG SULF 17.5-3.13-1.6 GM/177ML PO SOLN
1.0000 | ORAL | 0 refills | Status: DC
  Filled 2021-08-19 (×2): qty 354, 1d supply, fill #0

## 2021-08-19 NOTE — Progress Notes (Signed)

## 2021-08-20 ENCOUNTER — Other Ambulatory Visit (HOSPITAL_COMMUNITY): Payer: Self-pay

## 2021-08-21 ENCOUNTER — Other Ambulatory Visit (HOSPITAL_COMMUNITY): Payer: Self-pay

## 2021-08-28 ENCOUNTER — Other Ambulatory Visit (HOSPITAL_COMMUNITY): Payer: Self-pay

## 2021-08-28 MED ORDER — HYDROCODONE-ACETAMINOPHEN 10-325 MG PO TABS
1.0000 | ORAL_TABLET | Freq: Two times a day (BID) | ORAL | 0 refills | Status: DC | PRN
  Filled 2021-08-31: qty 60, 30d supply, fill #0

## 2021-08-29 ENCOUNTER — Other Ambulatory Visit (HOSPITAL_COMMUNITY): Payer: Self-pay

## 2021-08-31 ENCOUNTER — Other Ambulatory Visit (HOSPITAL_COMMUNITY): Payer: Self-pay

## 2021-09-02 ENCOUNTER — Other Ambulatory Visit (HOSPITAL_COMMUNITY): Payer: Self-pay

## 2021-09-02 ENCOUNTER — Other Ambulatory Visit: Payer: Self-pay

## 2021-09-02 ENCOUNTER — Encounter: Payer: Self-pay | Admitting: Gastroenterology

## 2021-09-02 ENCOUNTER — Ambulatory Visit (AMBULATORY_SURGERY_CENTER): Payer: 59 | Admitting: Gastroenterology

## 2021-09-02 VITALS — BP 157/69 | HR 75 | Temp 98.0°F | Resp 14 | Ht 66.0 in | Wt 210.0 lb

## 2021-09-02 DIAGNOSIS — Z800 Family history of malignant neoplasm of digestive organs: Secondary | ICD-10-CM | POA: Diagnosis not present

## 2021-09-02 DIAGNOSIS — Z538 Procedure and treatment not carried out for other reasons: Secondary | ICD-10-CM

## 2021-09-02 MED ORDER — ONDANSETRON HCL 4 MG PO TABS
4.0000 mg | ORAL_TABLET | Freq: Three times a day (TID) | ORAL | 0 refills | Status: DC | PRN
  Filled 2021-09-02: qty 10, 4d supply, fill #0

## 2021-09-02 MED ORDER — SODIUM CHLORIDE 0.9 % IV SOLN: 500.0000 mL | Freq: Once | INTRAVENOUS | Status: DC

## 2021-09-02 NOTE — Progress Notes (Signed)
Pt Drowsy. VSS. To PACU, report to RN. No anesthetic complications noted.  

## 2021-09-02 NOTE — Progress Notes (Signed)
[facility] History and Physical   Primary Care Physician:  Hoyt Koch, MD   Reason for Procedure:  Family history of colon cancer  Plan:    Screening colonoscopy with possible interventions as needed     HPI: Joann Page is a very pleasant 60 y.o. female here for screening colonoscopy. Denies any nausea, vomiting, abdominal pain, melena or bright red blood per rectum  The risks and benefits as well as alternatives of endoscopic procedure(s) have been discussed and reviewed. All questions answered. The patient agrees to proceed.    Past Medical History:  Diagnosis Date   Anxiety    Arthritis 2010   lumbar spine   Breast cancer ([place]) 07/17/12   at age 80, ER/PR +, hER 2 -   Cancer ([place]) 07/07/12   Left Breast Inv Ductal Ca.   Degenerative disk disease 2010   Lumbar spine   Depression    GERD (gastroesophageal reflux disease)    hx   H/O hiatal hernia    small   Hyperlipidemia    Knee pain, right    No pertinent past medical history    Obesity    S/P radiation therapy 11/30/12 - 01/12/13   Left Breast / 50 gray in 25 Fractions with boost 10 gray in 5 Fractions   Status post chemotherapy comp 10/27/12    4 CyclesTaxotere/ Cytoxan   Status post chemotherapy Comp. 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   BREATH TEK H PYLORI  12/07/2011   Procedure: BREATH TEK H PYLORI;  Surgeon: Shann Medal, MD;  Location: [facility];  Service: Endoscopy;  Laterality: N/A;   COLONOSCOPY  08/25/2016   Dr.Nandigam    Needle Core Biopsy  06/30/2012   Left Breast: 12 O'clock/ Benign Parenchyma    Prior to Admission medications   Medication Sig Start Date End Date Taking? Authorizing Provider  hydrochlorothiazide (HYDRODIURIL) 25 MG tablet TAKE 1 TABLET EVERY DAY 08/04/21  Yes Hoyt Koch, MD  Na Sulfate-K Sulfate-Mg Sulf 17.5-3.13-1.6 GM/177ML SOLN Take 1 kit by mouth as directed 08/19/21  Yes Nandigam, Venia Minks, MD  pantoprazole (PROTONIX) 40 MG tablet Take 1 tablet (40 mg total) by mouth daily. 06/17/21  Yes Hoyt Koch, MD  cyclobenzaprine (FLEXERIL) 10 MG tablet TAKE 1 TABLET BY MOUTH EVERY 8 TO 12 HOURS AS NEEDED FOR MUSCLE SPASM 12/22/20 12/22/21  Berle Mull, MD  estradiol (ESTRACE) 0.1 MG/GM vaginal cream Insert 1 gram twice a week vaginally at bedtime. 06/16/21     HYDROcodone-acetaminophen (NORCO) 10-325 MG tablet Take 1 tablet by mouth twice a day as needed for pain 08/28/21     ondansetron (ZOFRAN) 4 MG tablet Take 1 tablet (4 mg total) by mouth every 8 (eight) hours as needed for nausea or vomiting. Patient not taking: Reported on 09/02/2021 11/21/19   Hoyt Koch, MD  pravastatin (PRAVACHOL) 20 MG tablet Take 1 tablet (20 mg total) by mouth daily. 05/01/21   Hoyt Koch, MD  promethazine (PHENERGAN) 25 MG tablet TAKE 1 TABLET BY MOUTH  EVERY 8 HOURS AS NEEDED FOR NAUSEA OR VOMITING 12/05/20 12/05/21  Hoyt Koch, MD  vitamin B-12 (CYANOCOBALAMIN) 1000 MCG tablet Take 1 tablet (1,000 mcg total) by mouth daily. 12/16/17   Hoyt Koch, MD    Current Outpatient Medications  Medication Sig Dispense Refill   hydrochlorothiazide (HYDRODIURIL) 25 MG tablet TAKE 1 TABLET EVERY DAY 90 tablet 2   Na Sulfate-K Sulfate-Mg Sulf 17.5-3.13-1.6 GM/177ML SOLN Take 1 kit by mouth as directed 354 mL 0   pantoprazole (PROTONIX) 40 MG tablet Take 1 tablet (40 mg total) by mouth daily. 90 tablet 1   cyclobenzaprine (FLEXERIL) 10 MG tablet TAKE 1 TABLET BY MOUTH EVERY  8 TO 12 HOURS AS NEEDED FOR MUSCLE SPASM 90 tablet 2   estradiol (ESTRACE) 0.1 MG/GM vaginal cream Insert 1 gram twice a week vaginally at bedtime. 42.5 g 1   HYDROcodone-acetaminophen (NORCO) 10-325 MG tablet Take 1 tablet by mouth twice a day as needed for pain 60 tablet 0   ondansetron (ZOFRAN) 4 MG tablet Take 1 tablet (4 mg total) by mouth every 8 (eight) hours as needed for nausea or vomiting. (Patient not taking: Reported on 09/02/2021) 30 tablet 0   pravastatin (PRAVACHOL) 20 MG tablet Take 1 tablet (20 mg total) by mouth daily. 90 tablet 3   promethazine (PHENERGAN) 25 MG tablet TAKE 1 TABLET BY MOUTH EVERY 8 HOURS AS NEEDED FOR NAUSEA OR VOMITING 20 tablet 0   vitamin B-12 (CYANOCOBALAMIN) 1000 MCG tablet Take 1 tablet (1,000 mcg total) by mouth daily. 30 tablet 0   Current Facility-Administered Medications  Medication Dose Route Frequency Provider Last Rate Last Admin   0.9 %  sodium chloride infusion  500 mL Intravenous Once Mistie Adney, Venia Minks, MD        Allergies as of 09/02/2021 - Review Complete 09/02/2021  Allergen Reaction Noted   Sulfa antibiotics Shortness Of Breath and Swelling 04/29/2011    Family History  Problem Relation Age of Onset   Colon cancer Father 67   Alcohol abuse Father    Colon polyps Brother    Anxiety disorder Brother    Depression Brother    Breast cancer Sister 37   Obesity Sister    Anxiety disorder Sister    Depression Sister    Anxiety disorder Mother    Depression Mother    Bipolar disorder Mother    Lymphoma Brother    Drug abuse Brother    Anxiety disorder Sister    Depression Sister    Bipolar disorder Sister    Esophageal cancer Neg Hx    Stomach cancer Neg Hx    Rectal cancer Neg Hx     Social History   Socioeconomic History   Marital status: Divorced    Spouse name: Not on file   Number of children: 1   Years of education: Not on file   Highest education level: Associate degree: occupational, Hotel manager, or vocational  program  Occupational History   Occupation: Pharmacologist    CommentAstronomer  Tobacco Use   Smoking status: Never   Smokeless tobacco: Never  Vaping Use   Vaping Use: Never used  Substance and Sexual Activity   Alcohol use: No   Drug use: Yes    Comment: have drinks monthly   Sexual activity: Yes  Other Topics Concern   Not on file  Social History Narrative   Married for 20+ years, divorced Dec 2017   Living on own      Social  Determinants of Health   Financial Resource Strain: Not on file  Food Insecurity: Not on file  Transportation Needs: Not on file  Physical Activity: Not on file  Stress: Not on file  Social Connections: Not on file  Intimate Partner Violence: Not on file    Review of Systems:  All other review of systems negative except as mentioned in the HPI.  Physical Exam: Vital signs in last 24 hours: BP (!) 152/116   Pulse 83   Temp 98 F (36.7 C)   Resp 15   Ht 5' 6" (1.676 m)   Wt 210 lb (95.3 kg)   SpO2 99%   BMI 33.89 kg/m     General:   Alert, NAD Lungs:  Clear .   Heart:  Regular rate and rhythm Abdomen:  Soft, nontender and nondistended. Neuro/Psych:  Alert and cooperative. Normal mood and affect. A and O x 3  Reviewed labs, radiology imaging, old records and pertinent past GI work up  Patient is appropriate for planned procedure(s) and anesthesia in an ambulatory setting   K. Denzil Magnuson , MD [phone]

## 2021-09-02 NOTE — Op Note (Signed)
[facility] Patient Name: Joann Page Procedure Date: 09/02/2021 9:28 AM MRN: [id number] Endoscopist: Mauri Pole , MD Age: 60 Referring MD:  Date of Birth: 08/05/1961 Gender: Female Account #: [id number] Procedure:                Colonoscopy Indications:              Screening in patient at increased risk: Family                            history of 1st-degree relative with colorectal                            cancer Medicines:                Monitored Anesthesia Care Procedure:                Pre-Anesthesia Assessment:                           - Prior to the procedure, a History and Physical                            was performed, and patient medications and                            allergies were reviewed. The patient's tolerance of                            previous anesthesia was also reviewed. The risks                            and benefits of the procedure and the sedation                            options and risks were discussed with the patient.                            All questions were answered, and informed consent                            was obtained. Prior Anticoagulants: The patient has                            taken no previous anticoagulant or antiplatelet                            agents. ASA Grade Assessment: II - A patient with                            mild systemic disease. After reviewing the risks                            and benefits, the patient was deemed in  satisfactory condition to undergo the procedure.                           After obtaining informed consent, the colonoscope                            was passed under direct vision. Throughout the                            procedure, the patient's blood pressure, pulse, and                            oxygen saturations were monitored continuously. The                            Olympus PCF-H190DL (#[id number]) Colonoscope was                             introduced through the anus with the intention of                            advancing to the cecum. The scope was advanced to                            the sigmoid colon before the procedure was aborted.                            Medications were given. The colonoscopy was                            performed without difficulty. The patient tolerated                            the procedure well. The quality of the bowel                            preparation was poor. Scope In: Scope Out: Findings:                 The perianal and digital rectal examinations were                            normal.                           A moderate amount of stool was found in the sigmoid                            colon, making visualization difficult. Lavage of                            the area was performed, resulting in incomplete                            clearance with continued poor visualization.  Complications:            No immediate complications. Estimated Blood Loss:     Estimated blood loss was minimal. Impression:               - Preparation of the colon was poor.                           - Stool in the sigmoid colon.                           - No specimens collected. Recommendation:           - Patient has a contact number available for                            emergencies. The signs and symptoms of potential                            delayed complications were discussed with the                            patient. Return to normal activities tomorrow.                            Written discharge instructions were provided to the                            patient.                           - Resume previous diet.                           - Continue present medications.                           - Repeat colonoscopy at the next available                            appointment for screening purposes.                           - For future colonoscopy the  patient will require                            an extended preparation. If there are any                            questions, please contact the gastroenterologist. Mauri Pole, MD 09/02/2021 9:42:20 AM This report has been signed electronically.

## 2021-09-02 NOTE — Progress Notes (Signed)
[facility] Pt's states no medical or surgical changes since previsit or office visit.

## 2021-09-02 NOTE — Patient Instructions (Addendum)
Resume previous diet and continue current medications. Repeat Colonoscopy at next available appointment. Will need extended preparation for future colonoscopy. Miralax 1/2 prep 2 days before. Sutab prep day before. Zofran 4mg  as needed Q8H for nausea. YOU HAD AN ENDOSCOPIC PROCEDURE TODAY AT [facility]:   Refer to the procedure report that was given to you for any specific questions about what was found during the examination.  If the procedure report does not answer your questions, please call your gastroenterologist to clarify.  If you requested that your care partner not be given the details of your procedure findings, then the procedure report has been included in a sealed envelope for you to review at your convenience later.  YOU SHOULD EXPECT: Some feelings of bloating in the abdomen. Passage of more gas than usual.  Walking can help get rid of the air that was put into your GI tract during the procedure and reduce the bloating. If you had a lower endoscopy (such as a colonoscopy or flexible sigmoidoscopy) you may notice spotting of blood in your stool or on the toilet paper. If you underwent a bowel prep for your procedure, you may not have a normal bowel movement for a few days.  Please Note:  You might notice some irritation and congestion in your nose or some drainage.  This is from the oxygen used during your procedure.  There is no need for concern and it should clear up in a day or so.  SYMPTOMS TO REPORT IMMEDIATELY:  Following lower endoscopy (colonoscopy or flexible sigmoidoscopy):  Excessive amounts of blood in the stool  Significant tenderness or worsening of abdominal pains  Swelling of the abdomen that is new, acute  Fever of 100F or higher   For urgent or emergent issues, a gastroenterologist can be reached at any hour by calling [phone]. Do not use MyChart messaging for urgent concerns.    DIET:  We do recommend a small meal at first, but then you  may proceed to your regular diet.  Drink plenty of fluids but you should avoid alcoholic beverages for 24 hours.  ACTIVITY:  You should plan to take it easy for the rest of today and you should NOT DRIVE or use heavy machinery until tomorrow (because of the sedation medicines used during the test).    FOLLOW UP: Our staff will call the number listed on your records 48-72 hours following your procedure to check on you and address any questions or concerns that you may have regarding the information given to you following your procedure. If we do not reach you, we will leave a message.  We will attempt to reach you two times.  During this call, we will ask if you have developed any symptoms of COVID 19. If you develop any symptoms (ie: fever, flu-like symptoms, shortness of breath, cough etc.) before then, please call [phone].  If you test positive for Covid 19 in the 2 weeks post procedure, please call and report this information to [place].    If any biopsies were taken you will be contacted by phone or by letter within the next 1-3 weeks.  Please call us at [phone] if you have not heard about the biopsies in 3 weeks.    SIGNATURES/CONFIDENTIALITY: You and/or your care partner have signed paperwork which will be entered into your electronic medical record.  These signatures attest to the fact that that the information above on your After Visit Summary has been reviewed and  is understood.  Full responsibility of the confidentiality of this discharge information lies with you and/or your care-partner.

## 2021-09-04 ENCOUNTER — Telehealth: Payer: Self-pay | Admitting: *Deleted

## 2021-09-04 ENCOUNTER — Telehealth: Payer: Self-pay

## 2021-09-04 NOTE — Telephone Encounter (Signed)
Called [phone] and left a message we tried to reach pt for a follow up call. maw

## 2021-09-04 NOTE — Telephone Encounter (Signed)
Attempted to call patient for their post-procedure follow-up call. No answer. Left voicemail.   

## 2021-09-07 ENCOUNTER — Telehealth: Payer: Self-pay | Admitting: Gastroenterology

## 2021-09-07 ENCOUNTER — Ambulatory Visit (AMBULATORY_SURGERY_CENTER): Payer: 59 | Admitting: Gastroenterology

## 2021-09-07 ENCOUNTER — Encounter: Payer: Self-pay | Admitting: Gastroenterology

## 2021-09-07 ENCOUNTER — Other Ambulatory Visit: Payer: Self-pay

## 2021-09-07 VITALS — BP 159/73 | HR 74 | Temp 96.8°F | Resp 21 | Ht 66.0 in | Wt 210.0 lb

## 2021-09-07 DIAGNOSIS — K621 Rectal polyp: Secondary | ICD-10-CM

## 2021-09-07 DIAGNOSIS — Z800 Family history of malignant neoplasm of digestive organs: Secondary | ICD-10-CM | POA: Diagnosis present

## 2021-09-07 DIAGNOSIS — D128 Benign neoplasm of rectum: Secondary | ICD-10-CM

## 2021-09-07 DIAGNOSIS — Z1211 Encounter for screening for malignant neoplasm of colon: Secondary | ICD-10-CM | POA: Diagnosis not present

## 2021-09-07 MED ORDER — SODIUM CHLORIDE 0.9 % IV SOLN: 500.0000 mL | Freq: Once | INTRAVENOUS | Status: DC

## 2021-09-07 NOTE — Patient Instructions (Signed)
Please read handouts provided. Continue present medications. Await pathology results. Repeat colonoscopy in 5 years for screening.   YOU HAD AN ENDOSCOPIC PROCEDURE TODAY AT [facility]:   Refer to the procedure report that was given to you for any specific questions about what was found during the examination.  If the procedure report does not answer your questions, please call your gastroenterologist to clarify.  If you requested that your care partner not be given the details of your procedure findings, then the procedure report has been included in a sealed envelope for you to review at your convenience later.  YOU SHOULD EXPECT: Some feelings of bloating in the abdomen. Passage of more gas than usual.  Walking can help get rid of the air that was put into your GI tract during the procedure and reduce the bloating. If you had a lower endoscopy (such as a colonoscopy or flexible sigmoidoscopy) you may notice spotting of blood in your stool or on the toilet paper. If you underwent a bowel prep for your procedure, you may not have a normal bowel movement for a few days.  Please Note:  You might notice some irritation and congestion in your nose or some drainage.  This is from the oxygen used during your procedure.  There is no need for concern and it should clear up in a day or so.  SYMPTOMS TO REPORT IMMEDIATELY:  Following lower endoscopy (colonoscopy or flexible sigmoidoscopy):  Excessive amounts of blood in the stool  Significant tenderness or worsening of abdominal pains  Swelling of the abdomen that is new, acute  Fever of 100F or higher  For urgent or emergent issues, a gastroenterologist can be reached at any hour by calling [phone]. Do not use MyChart messaging for urgent concerns.    DIET:  We do recommend a small meal at first, but then you may proceed to your regular diet.  Drink plenty of fluids but you should avoid alcoholic beverages for 24  hours.  ACTIVITY:  You should plan to take it easy for the rest of today and you should NOT DRIVE or use heavy machinery until tomorrow (because of the sedation medicines used during the test).    FOLLOW UP: Our staff will call the number listed on your records 48-72 hours following your procedure to check on you and address any questions or concerns that you may have regarding the information given to you following your procedure. If we do not reach you, we will leave a message.  We will attempt to reach you two times.  During this call, we will ask if you have developed any symptoms of COVID 19. If you develop any symptoms (ie: fever, flu-like symptoms, shortness of breath, cough etc.) before then, please call [phone].  If you test positive for Covid 19 in the 2 weeks post procedure, please call and report this information to [place].    If any biopsies were taken you will be contacted by phone or by letter within the next 1-3 weeks.  Please call us at [phone] if you have not heard about the biopsies in 3 weeks.    SIGNATURES/CONFIDENTIALITY: You and/or your care partner have signed paperwork which will be entered into your electronic medical record.  These signatures attest to the fact that that the information above on your After Visit Summary has been reviewed and is understood.  Full responsibility of the confidentiality of this discharge information lies with you and/or your care-partner.

## 2021-09-07 NOTE — Telephone Encounter (Signed)
Called the patient back she was wondering if she should take the second half of her prep since she is having clear results. Intstructedthe patient to complete the entire prep. She verbalized understanding.

## 2021-09-07 NOTE — Progress Notes (Signed)
PT taken to PACU. Monitors in place. VSS. Report given to RN. 

## 2021-09-07 NOTE — Op Note (Signed)
[place] Patient Name: Joann Page Procedure Date: 09/07/2021 2:56 PM MRN: [id number] Endoscopist: Mauri Pole , MD Age: 60 Referring MD:  Date of Birth: 07-Feb-1961 Gender: Female Account #: [id number] Procedure:                Colonoscopy Indications:              Screening in patient at increased risk: Family                            history of 1st-degree relative with colorectal                            cancer Medicines:                Monitored Anesthesia Care Procedure:                Pre-Anesthesia Assessment:                           - Prior to the procedure, a History and Physical                            was performed, and patient medications and                            allergies were reviewed. The patient's tolerance of                            previous anesthesia was also reviewed. The risks                            and benefits of the procedure and the sedation                            options and risks were discussed with the patient.                            All questions were answered, and informed consent                            was obtained. Prior Anticoagulants: The patient has                            taken no previous anticoagulant or antiplatelet                            agents. ASA Grade Assessment: II - A patient with                            mild systemic disease. After reviewing the risks                            and benefits, the patient was deemed in  satisfactory condition to undergo the procedure.                           After obtaining informed consent, the colonoscope                            was passed under direct vision. Throughout the                            procedure, the patient's blood pressure, pulse, and                            oxygen saturations were monitored continuously. The                            [id number] PCF-H190TL S[place] SB Colonoscope was introduced                             through the anus and advanced to the the cecum,                            identified by appendiceal orifice and ileocecal                            valve. The colonoscopy was performed without                            difficulty. The patient tolerated the procedure                            well. The quality of the bowel preparation was                            excellent. The ileocecal valve, appendiceal                            orifice, and rectum were photographed. Scope In: 3:05:10 PM Scope Out: 3:25:01 PM Scope Withdrawal Time: 0 hours 11 minutes 33 seconds  Total Procedure Duration: 0 hours 19 minutes 51 seconds  Findings:                 The perianal and digital rectal examinations were                            normal.                           Three sessile polyps were found in the rectum. The                            polyps were 4 to 8 mm in size. These polyps were                            removed with a cold snare. Resection and retrieval  were complete.                           Non-bleeding external and internal hemorrhoids were                            found during retroflexion. The hemorrhoids were                            medium-sized.                           The exam was otherwise without abnormality. Complications:            No immediate complications. Estimated Blood Loss:     Estimated blood loss was minimal. Impression:               - Three 4 to 8 mm polyps in the rectum, removed                            with a cold snare. Resected and retrieved.                           - Non-bleeding external and internal hemorrhoids.                           - The examination was otherwise normal. Recommendation:           - Patient has a contact number available for                            emergencies. The signs and symptoms of potential                            delayed complications were discussed with  the                            patient. Return to normal activities tomorrow.                            Written discharge instructions were provided to the                            patient.                           - Resume previous diet.                           - Continue present medications.                           - Await pathology results.                           - Repeat colonoscopy in 5 years for surveillance  based on pathology results. Mauri Pole, MD 09/07/2021 3:33:16 PM This report has been signed electronically.

## 2021-09-07 NOTE — Progress Notes (Signed)
Pt's states no medical or surgical changes since previsit or office visit. 

## 2021-09-07 NOTE — Telephone Encounter (Signed)
Inbound call from patient requesting a call please.  Wants to know if she should take second part of the prep medication since she is having clear stools.

## 2021-09-07 NOTE — Progress Notes (Signed)
[name] History and Physical   Primary Care Physician:  Hoyt Koch, MD   Reason for Procedure:  Family history of colon cancer  Plan:    Screening colonoscopy with possible interventions as needed     HPI: Joann Page is a very pleasant 60 y.o. femalehere for screening colonoscopy. Denies any nausea, vomiting, abdominal pain, melena or bright red blood per rectum  The risks and benefits as well as alternatives of endoscopic procedure(s) have been discussed and reviewed. All questions answered. The patient agrees to proceed.    Past Medical History:  Diagnosis Date   Anxiety    Arthritis 2010   lumbar spine   Breast cancer ([place]) 07/17/12   at age 54, ER/PR +, hER 2 -   Cancer ([place]) 07/07/12   Left Breast Inv Ductal Ca.   Degenerative disk disease 2010   Lumbar spine   Depression    GERD (gastroesophageal reflux disease)    hx   H/O hiatal hernia    small   Hyperlipidemia    Knee pain, right    No pertinent past medical history    Obesity    S/P radiation therapy 11/30/12 - 01/12/13   Left Breast / 50 gray in 25 Fractions with boost 10 gray in 5 Fractions   Status post chemotherapy comp 10/27/12    4 CyclesTaxotere/ Cytoxan   Status post chemotherapy Comp. 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   BREATH TEK H PYLORI  12/07/2011   Procedure: BREATH TEK H PYLORI;  Surgeon: Shann Medal, MD;  Location: [facility];  Service: Endoscopy;  Laterality: N/A;   COLONOSCOPY  08/25/2016   Dr.Rhiana Morash    Needle Core Biopsy  06/30/2012   Left Breast: 12 O'clock/ Benign Parenchyma    Prior to Admission medications   Medication Sig Start Date End Date Taking? Authorizing Provider  hydrochlorothiazide (HYDRODIURIL) 25 MG tablet TAKE 1 TABLET EVERY DAY 08/04/21  Yes Hoyt Koch, MD  ondansetron (ZOFRAN) 4 MG tablet Take 1 tablet (4 mg total) by mouth every 8 (eight) hours as needed for nausea or vomiting. 11/21/19  Yes Hoyt Koch, MD  ondansetron (ZOFRAN) 4 MG tablet Take 1 tablet (4 mg total) by mouth every 8 (eight) hours as needed for up to 10 doses for nausea or vomiting. 09/02/21  Yes Tiajah Oyster, Venia Minks, MD  pantoprazole (PROTONIX) 40 MG tablet Take 1 tablet (40 mg total) by mouth daily. 06/17/21  Yes Hoyt Koch, MD  pravastatin (PRAVACHOL) 20 MG tablet Take 1 tablet (20 mg total) by mouth daily. 05/01/21  Yes Hoyt Koch, MD  vitamin B-12 (CYANOCOBALAMIN) 1000 MCG tablet Take 1 tablet (1,000 mcg total) by mouth daily. 12/16/17  Yes Hoyt Koch, MD  cyclobenzaprine (FLEXERIL) 10 MG tablet TAKE 1 TABLET BY MOUTH EVERY 8 TO 12 HOURS AS NEEDED FOR MUSCLE SPASM 12/22/20 12/22/21  Berle Mull, MD  estradiol (ESTRACE) 0.1 MG/GM vaginal cream Insert 1 gram twice a week vaginally at bedtime. 06/16/21     HYDROcodone-acetaminophen (NORCO)  10-325 MG tablet Take 1 tablet by mouth twice a day as needed for pain 08/28/21     promethazine (PHENERGAN) 25 MG tablet TAKE 1 TABLET BY MOUTH EVERY 8 HOURS AS NEEDED FOR NAUSEA OR VOMITING 12/05/20 12/05/21  Hoyt Koch, MD    Current Outpatient Medications  Medication Sig Dispense Refill   hydrochlorothiazide (HYDRODIURIL) 25 MG tablet TAKE 1 TABLET EVERY DAY 90 tablet 2   ondansetron (ZOFRAN) 4 MG tablet Take 1 tablet (4 mg total) by mouth every 8 (eight) hours as needed for nausea or vomiting. 30 tablet 0   ondansetron (ZOFRAN) 4 MG tablet Take 1 tablet (4 mg total) by mouth every 8 (eight) hours as needed for  up to 10 doses for nausea or vomiting. 10 tablet 0   pantoprazole (PROTONIX) 40 MG tablet Take 1 tablet (40 mg total) by mouth daily. 90 tablet 1   pravastatin (PRAVACHOL) 20 MG tablet Take 1 tablet (20 mg total) by mouth daily. 90 tablet 3   vitamin B-12 (CYANOCOBALAMIN) 1000 MCG tablet Take 1 tablet (1,000 mcg total) by mouth daily. 30 tablet 0   cyclobenzaprine (FLEXERIL) 10 MG tablet TAKE 1 TABLET BY MOUTH EVERY 8 TO 12 HOURS AS NEEDED FOR MUSCLE SPASM 90 tablet 2   estradiol (ESTRACE) 0.1 MG/GM vaginal cream Insert 1 gram twice a week vaginally at bedtime. 42.5 g 1   HYDROcodone-acetaminophen (NORCO) 10-325 MG tablet Take 1 tablet by mouth twice a day as needed for pain 60 tablet 0   promethazine (PHENERGAN) 25 MG tablet TAKE 1 TABLET BY MOUTH EVERY 8 HOURS AS NEEDED FOR NAUSEA OR VOMITING 20 tablet 0   Current Facility-Administered Medications  Medication Dose Route Frequency Provider Last Rate Last Admin   0.9 %  sodium chloride infusion  500 mL Intravenous Once Wojciech Willetts, Venia Minks, MD        Allergies as of 09/07/2021 - Review Complete 09/07/2021  Allergen Reaction Noted   Sulfa antibiotics Shortness Of Breath and Swelling 04/29/2011    Family History  Problem Relation Age of Onset   Colon cancer Father 6   Alcohol abuse Father    Colon polyps Brother    Anxiety disorder Brother    Depression Brother    Breast cancer Sister 90   Obesity Sister    Anxiety disorder Sister    Depression Sister    Anxiety disorder Mother    Depression Mother    Bipolar disorder Mother    Lymphoma Brother    Drug abuse Brother    Anxiety disorder Sister    Depression Sister    Bipolar disorder Sister    Esophageal cancer Neg Hx    Stomach cancer Neg Hx    Rectal cancer Neg Hx     Social History   Socioeconomic History   Marital status: Divorced    Spouse name: Not on file   Number of children: 1   Years of education: Not on file   Highest education level: Associate degree:  occupational, Hotel manager, or vocational program  Occupational History   Occupation: Pharmacologist    CommentAstronomer  Tobacco Use   Smoking status: Never   Smokeless tobacco: Never  Vaping Use   Vaping Use: Never used  Substance and Sexual Activity   Alcohol use: No   Drug use: Yes    Comment: have drinks monthly   Sexual activity: Yes  Other Topics Concern   Not on file  Social History Narrative   Married for 20+  years, divorced Dec 2017   Living on own      Social Determinants of Health   Financial Resource Strain: Not on file  Food Insecurity: Not on file  Transportation Needs: Not on file  Physical Activity: Not on file  Stress: Not on file  Social Connections: Not on file  Intimate Partner Violence: Not on file    Review of Systems:  All other review of systems negative except as mentioned in the HPI.  Physical Exam: Vital signs in last 24 hours: BP 124/79   Pulse 79   Temp (!) 96.8 F (36 C) (Temporal)   Ht 5\' 6"  (1.676 m)   Wt 210 lb (95.3 kg)   SpO2 100%   BMI 33.89 kg/m     General:   Alert, NAD Lungs:  Clear .   Heart:  Regular rate and rhythm Abdomen:  Soft, nontender and nondistended. Neuro/Psych:  Alert and cooperative. Normal mood and affect. A and O x 3  Reviewed labs, radiology imaging, old records and pertinent past GI work up  Patient is appropriate for planned procedure(s) and anesthesia in an ambulatory setting   K. Denzil Magnuson , MD [phone]

## 2021-09-09 ENCOUNTER — Telehealth: Payer: Self-pay

## 2021-09-09 NOTE — Telephone Encounter (Signed)
  Follow up Call-  Call back number 09/07/2021 09/02/2021  Post procedure Call Back phone  # [phone]  Permission to leave phone message Yes Yes  Some recent data might be hidden     Patient questions:  Do you have a fever, pain , or abdominal swelling? No. Pain Score  0 *  Have you tolerated food without any problems? Yes.    Have you been able to return to your normal activities? Yes.    Do you have any questions about your discharge instructions: Diet   No. Medications  No. Follow up visit  No.  Do you have questions or concerns about your Care? No.  Actions: * If pain score is 4 or above: No action needed, pain <4.

## 2021-09-24 ENCOUNTER — Encounter: Payer: Self-pay | Admitting: Gastroenterology

## 2021-09-25 ENCOUNTER — Other Ambulatory Visit (HOSPITAL_COMMUNITY): Payer: Self-pay

## 2021-09-25 MED ORDER — HYDROCODONE-ACETAMINOPHEN 10-325 MG PO TABS
1.0000 | ORAL_TABLET | Freq: Two times a day (BID) | ORAL | 0 refills | Status: DC | PRN
  Filled 2021-09-25 – 2021-09-28 (×2): qty 60, 30d supply, fill #0

## 2021-09-28 ENCOUNTER — Other Ambulatory Visit (HOSPITAL_COMMUNITY): Payer: Self-pay

## 2021-10-26 ENCOUNTER — Other Ambulatory Visit (HOSPITAL_COMMUNITY): Payer: Self-pay

## 2021-10-26 MED ORDER — CYCLOBENZAPRINE HCL 10 MG PO TABS
ORAL_TABLET | ORAL | 2 refills | Status: DC
  Filled 2021-10-26: qty 90, 30d supply, fill #0
  Filled 2022-06-04: qty 90, 30d supply, fill #1

## 2021-10-26 MED ORDER — HYDROCODONE-ACETAMINOPHEN 10-325 MG PO TABS
1.0000 | ORAL_TABLET | Freq: Two times a day (BID) | ORAL | 0 refills | Status: DC | PRN
  Filled 2021-10-26: qty 60, 30d supply, fill #0

## 2021-11-19 ENCOUNTER — Other Ambulatory Visit (HOSPITAL_COMMUNITY): Payer: Self-pay

## 2021-11-19 MED ORDER — HYDROCODONE-ACETAMINOPHEN 10-325 MG PO TABS
1.0000 | ORAL_TABLET | Freq: Two times a day (BID) | ORAL | 0 refills | Status: DC | PRN
  Filled 2021-11-19 – 2021-11-20 (×2): qty 60, 30d supply, fill #0

## 2021-11-20 ENCOUNTER — Other Ambulatory Visit: Payer: Self-pay | Admitting: Internal Medicine

## 2021-11-20 ENCOUNTER — Other Ambulatory Visit (HOSPITAL_COMMUNITY): Payer: Self-pay

## 2021-12-17 ENCOUNTER — Other Ambulatory Visit (HOSPITAL_COMMUNITY): Payer: Self-pay

## 2021-12-17 MED ORDER — HYDROCODONE-ACETAMINOPHEN 10-325 MG PO TABS
1.0000 | ORAL_TABLET | Freq: Two times a day (BID) | ORAL | 0 refills | Status: DC | PRN
  Filled 2021-12-17 – 2021-12-18 (×2): qty 60, 30d supply, fill #0

## 2021-12-18 ENCOUNTER — Other Ambulatory Visit (HOSPITAL_COMMUNITY): Payer: Self-pay

## 2021-12-30 ENCOUNTER — Ambulatory Visit: Payer: 59 | Admitting: Podiatry

## 2021-12-30 NOTE — Progress Notes (Incomplete)
Patient Care Team: Hoyt Koch, MD as PCP - General (Internal Medicine) Berle Mull, MD as Consulting Physician (Family Medicine) [place] Dragon, MD (Inactive) as Consulting Physician (Gastroenterology) Himmelrich, Bryson Ha, RD (Inactive) as Dietitian  DIAGNOSIS: No diagnosis found.  SUMMARY OF ONCOLOGIC HISTORY: Oncology History  Breast cancer of lower-outer quadrant of left female breast ([place])  07/03/2012 Initial Diagnosis   Left: IDC, MRI 2.3 cm mass   07/17/2012 Surgery   Left Lumpectomy: 1.7 cm, IDC, High grade, 4 SN neg,  with HG DCIS with necrosis, Oncotype Dx Intermed grade   07/31/2012 - 11/23/2012 Chemotherapy   Adjuvant chemo TC x 4   11/30/2012 - 01/12/2013 Radiation Therapy   Adjuvant XRT   01/18/2013 -  Anti-estrogen oral therapy   Adjuvant  tamoxifen 10 years     CHIEF COMPLIANT: Follow-up of left breast cancer on tamoxifen   INTERVAL HISTORY: Joann Page is a 61 y.o. with above-mentioned history of left breast cancer treated with lumpectomy, adjuvant chemotherapy, radiation, and is currently on anti-estrogen therapy with tamoxifen. Mammogram on 08/17/2021 showed no evidence of malignancy. She presents to the clinic today for annual follow-up.   ALLERGIES:  is allergic to sulfa antibiotics.  MEDICATIONS:  Current Outpatient Medications  Medication Sig Dispense Refill   cyclobenzaprine (FLEXERIL) 10 MG tablet Take 1 tablet by mouth every 8 to 12 hours 90 tablet 2   estradiol (ESTRACE) 0.1 MG/GM vaginal cream Insert 1 gram twice a week vaginally at bedtime. 42.5 g 1   hydrochlorothiazide (HYDRODIURIL) 25 MG tablet TAKE 1 TABLET EVERY DAY 90 tablet 2   HYDROcodone-acetaminophen (NORCO) 10-325 MG tablet Take 1 tablet by mouth twice a day as needed for pain 60 tablet 0   ondansetron (ZOFRAN) 4 MG tablet Take 1 tablet (4 mg total) by mouth every 8 (eight) hours as needed for nausea or vomiting. 30 tablet 0   ondansetron (ZOFRAN) 4 MG tablet Take 1 tablet (4  mg total) by mouth every 8 (eight) hours as needed for up to 10 doses for nausea or vomiting. 10 tablet 0   pantoprazole (PROTONIX) 40 MG tablet TAKE ONE TABLET BY MOUTH ONE TIME DAILY 90 tablet 0   pravastatin (PRAVACHOL) 20 MG tablet Take 1 tablet (20 mg total) by mouth daily. 90 tablet 3   promethazine (PHENERGAN) 25 MG tablet TAKE 1 TABLET BY MOUTH EVERY 8 HOURS AS NEEDED FOR NAUSEA OR VOMITING 20 tablet 0   vitamin B-12 (CYANOCOBALAMIN) 1000 MCG tablet Take 1 tablet (1,000 mcg total) by mouth daily. 30 tablet 0   No current facility-administered medications for this visit.    PHYSICAL EXAMINATION: ECOG PERFORMANCE STATUS: {CHL ONC ECOG PS:[phone]}  There were no vitals filed for this visit. There were no vitals filed for this visit.  BREAST:*** No palpable masses or nodules in either right or left breasts. No palpable axillary supraclavicular or infraclavicular adenopathy no breast tenderness or nipple discharge. (exam performed in the presence of a chaperone)  LABORATORY DATA:  I have reviewed the data as listed CMP Latest Ref Rng & Units 03/23/2021 08/22/2020 08/22/2020  Glucose 70 - 99 mg/dL 101(H) 107(H) 112(H)  BUN 6 - 23 mg/dL 10 11 12   Creatinine 0.40 - 1.20 mg/dL 0.84 0.80 0.76  Sodium 135 - 145 mEq/L 140 142 139  Potassium 3.5 - 5.1 mEq/L 3.5 3.5 3.4(L)  Chloride 96 - 112 mEq/L 104 103 104  CO2 19 - 32 mEq/L 26 - 26  Calcium 8.4 - 10.5  mg/dL 9.5 - 8.6(L)  Total Protein 6.0 - 8.3 g/dL 6.9 - 6.8  Total Bilirubin 0.2 - 1.2 mg/dL 0.6 - 0.4  Alkaline Phos 39 - 117 U/L 57 - 41  AST 0 - 37 U/L 26 - 25  ALT 0 - 35 U/L 26 - 21    Lab Results  Component Value Date   WBC 3.0 (L) 03/23/2021   HGB 13.6 03/23/2021   HCT 40.0 03/23/2021   MCV 86.2 03/23/2021   PLT 223.0 03/23/2021   NEUTROABS 3.7 08/22/2020    ASSESSMENT & PLAN:  No problem-specific Assessment & Plan notes found for this encounter.    No orders of the defined types were placed in this  encounter.  The patient has a good understanding of the overall plan. she agrees with it. she will call with any problems that may develop before the next visit here.  Total time spent: *** mins including face to face time and time spent for planning, charting and coordination of care  Rulon Eisenmenger, MD, MPH 12/30/2021  I, Thana Ates, am acting as scribe for Dr. Nicholas Lose.  {insert scribe attestation}

## 2021-12-31 ENCOUNTER — Inpatient Hospital Stay: Payer: 59 | Admitting: Hematology and Oncology

## 2021-12-31 NOTE — Assessment & Plan Note (Deleted)
September 2013 Oncotype DX intermediate risk status post Taxotere Cytoxan x4 cycles followed by radiation therapy and adjuvant tamoxifen started 01/18/2013  Tamoxifen toxicities: Hot flashes mostly resolved Vaginal dryness  Breast Cancer Surveillance: 1. Breast exam: 01/10/2022: Benign 2. Mammogramdone at [facility]9/19/2022 benign breast density category A  Return to clinic in 1 year for follow-up

## 2022-01-06 ENCOUNTER — Ambulatory Visit: Payer: 59 | Admitting: Podiatry

## 2022-01-11 NOTE — Progress Notes (Signed)
Patient Care Team: Hoyt Koch, MD as PCP - General (Internal Medicine) Berle Mull, MD as Consulting Physician (Family Medicine) [place] Dragon, MD (Inactive) as Consulting Physician (Gastroenterology) Himmelrich, Bryson Ha, RD (Inactive) as Dietitian  DIAGNOSIS:    ICD-10-CM   1. 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 with above-mentioned history of left breast cancer treated with lumpectomy, adjuvant chemotherapy, radiation, was on tamoxifen and she discontinued it in 2022 because of worsening vaginal dryness.. Mammogram on 08/17/2021 showed no evidence of malignancy. She presents to the clinic today for annual follow-up.   ALLERGIES:  is allergic to sulfa antibiotics.  MEDICATIONS:  Current Outpatient Medications  Medication Sig Dispense Refill   cyclobenzaprine (FLEXERIL) 10 MG tablet Take 1 tablet by mouth every 8 to 12 hours 90 tablet 2   estradiol (ESTRACE) 0.1 MG/GM vaginal cream Insert 1 gram twice a week vaginally at bedtime. 42.5 g 1   hydrochlorothiazide (HYDRODIURIL) 25 MG tablet TAKE 1 TABLET EVERY DAY 90 tablet 2   HYDROcodone-acetaminophen (NORCO) 10-325 MG tablet Take 1 tablet by mouth twice a day as needed for pain 60 tablet 0   ondansetron (ZOFRAN) 4 MG tablet Take 1 tablet (4  mg total) by mouth every 8 (eight) hours as needed for nausea or vomiting. 30 tablet 0   ondansetron (ZOFRAN) 4 MG tablet Take 1 tablet (4 mg total) by mouth every 8 (eight) hours as needed for up to 10 doses for nausea or vomiting. 10 tablet 0   pantoprazole (PROTONIX) 40 MG tablet TAKE ONE TABLET BY MOUTH ONE TIME DAILY 90 tablet 0   pravastatin (PRAVACHOL) 20 MG tablet Take 1 tablet (20 mg total) by mouth daily. 90 tablet 3   promethazine (PHENERGAN) 25 MG tablet TAKE 1 TABLET BY MOUTH EVERY 8 HOURS AS NEEDED FOR NAUSEA OR VOMITING 20 tablet 0   vitamin B-12 (CYANOCOBALAMIN) 1000 MCG tablet Take 1 tablet (1,000 mcg total) by mouth daily. 30 tablet 0   No current facility-administered medications for this visit.    PHYSICAL EXAMINATION: ECOG PERFORMANCE STATUS: 1 - Symptomatic but completely ambulatory  Vitals:   01/12/22 1119  BP: (!) 166/87  Pulse: 90  Resp: 18  Temp: (!) 97.3 F (36.3 C)  SpO2: 100%   Filed Weights   01/12/22 1119  Weight: 208 lb 6.4 oz (94.5 kg)    BREAST: No palpable masses or nodules in either right or left breasts. No palpable axillary supraclavicular or infraclavicular adenopathy no breast tenderness or nipple discharge. (exam performed in the presence of a chaperone)  LABORATORY DATA:  I have reviewed the data as listed CMP Latest Ref Rng & Units 03/23/2021 08/22/2020 08/22/2020  Glucose 70 - 99 mg/dL  101(H) 107(H) 112(H)  BUN 6 - 23 mg/dL 10 11 12   Creatinine 0.40 - 1.20 mg/dL 0.84 0.80 0.76  Sodium 135 - 145 mEq/L 140 142 139  Potassium 3.5 - 5.1 mEq/L 3.5 3.5 3.4(L)  Chloride 96 - 112 mEq/L 104 103 104  CO2 19 - 32 mEq/L 26 - 26  Calcium 8.4 - 10.5 mg/dL 9.5 - 8.6(L)  Total Protein 6.0 - 8.3 g/dL 6.9 - 6.8  Total Bilirubin 0.2 - 1.2 mg/dL 0.6 - 0.4  Alkaline Phos 39 - 117 U/L 57 - 41  AST 0 - 37 U/L 26 - 25  ALT 0 - 35 U/L 26 - 21    Lab Results  Component Value Date   WBC 3.0 (L) 03/23/2021   HGB 13.6 03/23/2021   HCT 40.0 03/23/2021    MCV 86.2 03/23/2021   PLT 223.0 03/23/2021   NEUTROABS 3.7 08/22/2020    ASSESSMENT & PLAN:  Breast cancer 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  We discussed the risks and benefits and agreed to continue with that.   Breast Cancer Surveillance: 1. Breast exam 01/12/2022: Benign 2. Mammogram done at [facility] 08/17/2021 benign breast density category A   Exercising and watching her diet: Encouraged her to restart her exercises once again. Return to clinic in 1 year for follow-up with long-term survivorship clinic with Mendel Ryder.    No orders of the defined types were placed in this encounter.  The patient has a good understanding of the overall plan. she agrees with it. she will call with any problems that may develop before the next visit here.  Total time spent: 20 mins including face to face time and time spent for planning, charting and coordination of care  Rulon Eisenmenger, MD, MPH 01/12/2022  I, Thana Ates, am acting as scribe for Dr. Nicholas Lose.  I have reviewed the above documentation for accuracy and completeness, and I agree with the above.

## 2022-01-12 ENCOUNTER — Other Ambulatory Visit: Payer: Self-pay

## 2022-01-12 ENCOUNTER — Inpatient Hospital Stay: Payer: 59 | Attending: Hematology and Oncology | Admitting: Hematology and Oncology

## 2022-01-12 DIAGNOSIS — Z170 Estrogen receptor positive status [ER+]: Secondary | ICD-10-CM | POA: Diagnosis not present

## 2022-01-12 DIAGNOSIS — C50512 Malignant neoplasm of lower-outer quadrant of left female breast: Secondary | ICD-10-CM

## 2022-01-12 DIAGNOSIS — Z923 Personal history of irradiation: Secondary | ICD-10-CM | POA: Diagnosis not present

## 2022-01-12 DIAGNOSIS — Z79899 Other long term (current) drug therapy: Secondary | ICD-10-CM | POA: Insufficient documentation

## 2022-01-12 DIAGNOSIS — Z853 Personal history of malignant neoplasm of breast: Secondary | ICD-10-CM | POA: Diagnosis not present

## 2022-01-12 NOTE — Assessment & Plan Note (Signed)
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 hot flashesmostly resolved. Vaginal dryness: She is experiencing vaginal dryness in spite of using Replens.  I discussed with her about talking with her gynecologist to see if topical estrogens can be used for the vaginal dryness.  I also talked to her about Josph Macho touch.  Breast Cancer Surveillance: 1. Breast exam 01/12/2022: Benign 2. [facility] at [facility] 08/17/2021 benign breast density category A  Exercising and watching her diet: Her weight appears to be up and down based upon how she exercises and how she eats. Return to clinic in 1 year for follow-up

## 2022-01-15 ENCOUNTER — Other Ambulatory Visit (HOSPITAL_COMMUNITY): Payer: Self-pay

## 2022-01-15 MED ORDER — HYDROCODONE-ACETAMINOPHEN 10-325 MG PO TABS
1.0000 | ORAL_TABLET | Freq: Two times a day (BID) | ORAL | 0 refills | Status: DC | PRN
  Filled 2022-01-15: qty 60, 30d supply, fill #0

## 2022-02-09 ENCOUNTER — Other Ambulatory Visit (HOSPITAL_COMMUNITY): Payer: Self-pay

## 2022-02-09 ENCOUNTER — Other Ambulatory Visit: Payer: Self-pay | Admitting: Sports Medicine

## 2022-02-09 DIAGNOSIS — M67911 Unspecified disorder of synovium and tendon, right shoulder: Secondary | ICD-10-CM

## 2022-02-09 MED ORDER — HYDROCODONE-ACETAMINOPHEN 10-325 MG PO TABS
1.0000 | ORAL_TABLET | Freq: Two times a day (BID) | ORAL | 0 refills | Status: DC | PRN
  Filled 2022-02-10 – 2022-02-12 (×2): qty 60, 30d supply, fill #0

## 2022-02-10 ENCOUNTER — Other Ambulatory Visit (HOSPITAL_COMMUNITY): Payer: Self-pay

## 2022-02-11 ENCOUNTER — Other Ambulatory Visit (HOSPITAL_COMMUNITY): Payer: Self-pay

## 2022-02-12 ENCOUNTER — Other Ambulatory Visit (HOSPITAL_COMMUNITY): Payer: Self-pay

## 2022-02-21 ENCOUNTER — Other Ambulatory Visit: Payer: 59

## 2022-03-02 ENCOUNTER — Other Ambulatory Visit (HOSPITAL_COMMUNITY): Payer: Self-pay

## 2022-03-02 ENCOUNTER — Ambulatory Visit
Admission: RE | Admit: 2022-03-02 | Discharge: 2022-03-02 | Disposition: A | Discharge status: Home / Self Care | Payer: 59 | Source: Ambulatory Visit | Attending: Sports Medicine | Admitting: Sports Medicine

## 2022-03-02 ENCOUNTER — Other Ambulatory Visit: Payer: Self-pay | Admitting: Family Medicine

## 2022-03-02 DIAGNOSIS — N3090 Cystitis, unspecified without hematuria: Secondary | ICD-10-CM

## 2022-03-02 DIAGNOSIS — M67911 Unspecified disorder of synovium and tendon, right shoulder: Secondary | ICD-10-CM

## 2022-03-05 ENCOUNTER — Other Ambulatory Visit (HOSPITAL_COMMUNITY): Payer: Self-pay

## 2022-03-08 ENCOUNTER — Other Ambulatory Visit (HOSPITAL_COMMUNITY): Payer: Self-pay

## 2022-03-08 MED ORDER — NITROFURANTOIN MONOHYD MACRO 100 MG PO CAPS
ORAL_CAPSULE | ORAL | 1 refills | Status: DC
  Filled 2022-03-08: qty 30, 30d supply, fill #0

## 2022-03-09 ENCOUNTER — Other Ambulatory Visit (HOSPITAL_COMMUNITY): Payer: Self-pay

## 2022-03-11 ENCOUNTER — Other Ambulatory Visit (HOSPITAL_COMMUNITY): Payer: Self-pay

## 2022-03-11 MED ORDER — HYDROCODONE-ACETAMINOPHEN 10-325 MG PO TABS
1.0000 | ORAL_TABLET | Freq: Two times a day (BID) | ORAL | 0 refills | Status: DC | PRN
  Filled 2022-03-11 (×2): qty 60, 30d supply, fill #0

## 2022-03-24 ENCOUNTER — Other Ambulatory Visit: Payer: Self-pay | Admitting: Internal Medicine

## 2022-03-31 ENCOUNTER — Ambulatory Visit (INDEPENDENT_AMBULATORY_CARE_PROVIDER_SITE_OTHER): Payer: 59 | Admitting: Internal Medicine

## 2022-03-31 ENCOUNTER — Encounter: Payer: Self-pay | Admitting: Internal Medicine

## 2022-03-31 VITALS — BP 122/80 | HR 73 | Resp 18 | Ht 66.0 in | Wt 201.0 lb

## 2022-03-31 DIAGNOSIS — Z853 Personal history of malignant neoplasm of breast: Secondary | ICD-10-CM

## 2022-03-31 DIAGNOSIS — Z0000 Encounter for general adult medical examination without abnormal findings: Secondary | ICD-10-CM

## 2022-03-31 DIAGNOSIS — I10 Essential (primary) hypertension: Secondary | ICD-10-CM

## 2022-03-31 DIAGNOSIS — K219 Gastro-esophageal reflux disease without esophagitis: Secondary | ICD-10-CM

## 2022-03-31 DIAGNOSIS — I700 Atherosclerosis of aorta: Secondary | ICD-10-CM

## 2022-03-31 LAB — CBC WITH DIFFERENTIAL/PLATELET
Basophils Absolute: 0 10*3/uL (ref 0.0–0.1)
Basophils Relative: 0.4 % (ref 0.0–3.0)
Eosinophils Absolute: 0 10*3/uL (ref 0.0–0.7)
Eosinophils Relative: 0.9 % (ref 0.0–5.0)
HCT: 42.7 % (ref 36.0–46.0)
Hemoglobin: 14.1 g/dL (ref 12.0–15.0)
Lymphocytes Relative: 36.4 % (ref 12.0–46.0)
Lymphs Abs: 1.7 10*3/uL (ref 0.7–4.0)
MCHC: 32.9 g/dL (ref 30.0–36.0)
MCV: 89.2 fl (ref 78.0–100.0)
Monocytes Absolute: 0.3 10*3/uL (ref 0.1–1.0)
Monocytes Relative: 6.1 % (ref 3.0–12.0)
Neutro Abs: 2.7 10*3/uL (ref 1.4–7.7)
Neutrophils Relative %: 56.2 % (ref 43.0–77.0)
Platelets: 212 10*3/uL (ref 150.0–400.0)
RBC: 4.79 Mil/uL (ref 3.87–5.11)
RDW: 13.1 % (ref 11.5–15.5)
WBC: 4.7 10*3/uL (ref 4.0–10.5)

## 2022-03-31 LAB — LIPID PANEL
Cholesterol: 191 mg/dL (ref 0–200)
HDL: 53.1 mg/dL (ref >39.00)
LDL Cholesterol: 111 mg/dL — ABNORMAL HIGH (ref 0–99)
NonHDL: 138.27
Total CHOL/HDL Ratio: 4
Triglycerides: 137 mg/dL (ref 0.0–149.0)
VLDL: 27.4 mg/dL (ref 0.0–40.0)

## 2022-03-31 LAB — COMPREHENSIVE METABOLIC PANEL
ALT: 26 U/L (ref 0–35)
AST: 23 U/L (ref 0–37)
Albumin: 4.1 g/dL (ref 3.5–5.2)
Alkaline Phosphatase: 62 U/L (ref 39–117)
BUN: 11 mg/dL (ref 6–23)
CO2: 31 mEq/L (ref 19–32)
Calcium: 9.5 mg/dL (ref 8.4–10.5)
Chloride: 101 mEq/L (ref 96–112)
Creatinine, Ser: 0.77 mg/dL (ref 0.40–1.20)
GFR: 84.54 mL/min (ref >60.00)
Glucose, Bld: 89 mg/dL (ref 70–99)
Potassium: 3.8 mEq/L (ref 3.5–5.1)
Sodium: 139 mEq/L (ref 135–145)
Total Bilirubin: 0.4 mg/dL (ref 0.2–1.2)
Total Protein: 6.9 g/dL (ref 6.0–8.3)

## 2022-03-31 LAB — HEMOGLOBIN A1C: Hgb A1c MFr Bld: 5.7 % (ref 4.6–6.5)

## 2022-03-31 MED ORDER — PROMETHAZINE-DM 6.25-15 MG/5ML PO SYRP: 5.0000 mL | ORAL_SOLUTION | Freq: Four times a day (QID) | ORAL | 0 refills | Status: DC | PRN

## 2022-03-31 MED ORDER — PANTOPRAZOLE SODIUM 40 MG PO TBEC: 40.0000 mg | DELAYED_RELEASE_TABLET | Freq: Every day | ORAL | 3 refills | Status: DC

## 2022-03-31 MED ORDER — PRAVASTATIN SODIUM 20 MG PO TABS: 20.0000 mg | ORAL_TABLET | Freq: Every day | ORAL | 3 refills | Status: DC

## 2022-03-31 MED ORDER — HYDROCHLOROTHIAZIDE 25 MG PO TABS: 25.0000 mg | ORAL_TABLET | Freq: Every day | ORAL | 3 refills | Status: DC

## 2022-03-31 NOTE — Progress Notes (Signed)
? ?  Subjective:  ? ?Patient ID: Joann Page, female    DOB: 1961/03/05, 61 y.o.   MRN: [id number] ? ?HPI ?The patient is here for physical. ? ?PMH, [facility], social history reviewed and updated ? ?Review of Systems  ?Constitutional: Negative.   ?HENT: Negative.    ?Eyes: Negative.   ?Respiratory:  Negative for cough, chest tightness and shortness of breath.   ?Cardiovascular:  Negative for chest pain, palpitations and leg swelling.  ?Gastrointestinal:  Negative for abdominal distention, abdominal pain, constipation, diarrhea, nausea and vomiting.  ?Musculoskeletal: Negative.   ?Skin: Negative.   ?Neurological: Negative.   ?Psychiatric/Behavioral: Negative.    ? ?Objective:  ?Physical Exam ?Constitutional:   ?   Appearance: She is well-developed.  ?HENT:  ?   Head: Normocephalic and atraumatic.  ?Cardiovascular:  ?   Rate and Rhythm: Normal rate and regular rhythm.  ?Pulmonary:  ?   Effort: Pulmonary effort is normal. No respiratory distress.  ?   Breath sounds: Normal breath sounds. No wheezing or rales.  ?Abdominal:  ?   General: Bowel sounds are normal. There is no distension.  ?   Palpations: Abdomen is soft.  ?   Tenderness: There is no abdominal tenderness. There is no rebound.  ?Musculoskeletal:  ?   Cervical back: Normal range of motion.  ?Skin: ?   General: Skin is warm and dry.  ?Neurological:  ?   Mental Status: She is alert and oriented to person, place, and time.  ?   Coordination: Coordination normal.  ? ? ?Vitals:  ? 03/31/22 0853  ?BP: 122/80  ?Pulse: 73  ?Resp: 18  ?SpO2: 95%  ?Weight: 201 lb (91.2 kg)  ?Height: '5\' 6"'$  (1.676 m)  ? ? ?This visit occurred during the SARS-CoV-2 public health emergency.  Safety protocols were in place, including screening questions prior to the visit, additional usage of staff PPE, and extensive cleaning of exam room while observing appropriate contact time as indicated for disinfecting solutions.  ? ?Assessment & Plan:  ? ?

## 2022-04-01 ENCOUNTER — Telehealth: Payer: Self-pay | Admitting: Internal Medicine

## 2022-04-01 NOTE — Telephone Encounter (Signed)
Pt requesting a cb w/ additional questions regarding 03-31-2022 lab results ? ?Pt states she has received her results ?

## 2022-04-01 NOTE — Telephone Encounter (Signed)
Results have not been reviewed by the provider. Once provider reviews her results I will contact the patient to discuss.  ?

## 2022-04-02 ENCOUNTER — Encounter: Payer: Self-pay | Admitting: Internal Medicine

## 2022-04-02 NOTE — Assessment & Plan Note (Signed)
Checking lipid panel and adjust pravastatin 20 mg daily as needed. 

## 2022-04-02 NOTE — Assessment & Plan Note (Signed)
Getting mammogram yearly.  ?

## 2022-04-02 NOTE — Assessment & Plan Note (Signed)
Taking protonix 40 mg daily and refilled. This is controlling symptoms.  ?

## 2022-04-02 NOTE — Assessment & Plan Note (Signed)
Flu shot yearly. Covid-19 counseled. Shingrix complete. Tetanus up to date. Colonoscopy up to date. Mammogram up to date, pap smear not indicated. Counseled about sun safety and mole surveillance. Counseled about the dangers of distracted driving. Given 10 year screening recommendations.   

## 2022-04-02 NOTE — Assessment & Plan Note (Signed)
BP at goal on hctz 25 mg daily. Checking CMP and adjust as needed.  ?

## 2022-04-07 ENCOUNTER — Other Ambulatory Visit (HOSPITAL_COMMUNITY): Payer: Self-pay

## 2022-04-07 MED ORDER — HYDROCODONE-ACETAMINOPHEN 10-325 MG PO TABS
1.0000 | ORAL_TABLET | Freq: Two times a day (BID) | ORAL | 0 refills | Status: DC | PRN
  Filled 2022-04-08: qty 60, 30d supply, fill #0

## 2022-04-08 ENCOUNTER — Other Ambulatory Visit (HOSPITAL_COMMUNITY): Payer: Self-pay

## 2022-05-10 ENCOUNTER — Other Ambulatory Visit (HOSPITAL_COMMUNITY): Payer: Self-pay

## 2022-05-10 MED ORDER — IBUPROFEN 800 MG PO TABS
800.0000 mg | ORAL_TABLET | Freq: Three times a day (TID) | ORAL | 2 refills | Status: AC
  Filled 2022-05-10: qty 90, 30d supply, fill #0
  Filled 2022-06-04: qty 90, 30d supply, fill #1

## 2022-05-18 ENCOUNTER — Other Ambulatory Visit (HOSPITAL_COMMUNITY): Payer: Self-pay

## 2022-05-18 MED ORDER — HYDROCODONE-ACETAMINOPHEN 10-325 MG PO TABS
1.0000 | ORAL_TABLET | Freq: Two times a day (BID) | ORAL | 0 refills | Status: DC | PRN
  Filled 2022-05-18: qty 60, 30d supply, fill #0

## 2022-06-05 ENCOUNTER — Other Ambulatory Visit (HOSPITAL_COMMUNITY): Payer: Self-pay

## 2022-06-07 ENCOUNTER — Other Ambulatory Visit (HOSPITAL_COMMUNITY): Payer: Self-pay

## 2022-06-12 ENCOUNTER — Other Ambulatory Visit (HOSPITAL_COMMUNITY): Payer: Self-pay

## 2022-06-12 MED ORDER — HYDROCODONE-ACETAMINOPHEN 10-325 MG PO TABS
1.0000 | ORAL_TABLET | Freq: Two times a day (BID) | ORAL | 0 refills | Status: DC | PRN
  Filled 2022-06-15: qty 60, 30d supply, fill #0

## 2022-06-15 ENCOUNTER — Other Ambulatory Visit (HOSPITAL_COMMUNITY): Payer: Self-pay

## 2022-07-04 ENCOUNTER — Other Ambulatory Visit (HOSPITAL_COMMUNITY): Payer: Self-pay

## 2022-07-09 ENCOUNTER — Other Ambulatory Visit (HOSPITAL_COMMUNITY): Payer: Self-pay

## 2022-07-09 MED ORDER — HYDROCODONE-ACETAMINOPHEN 10-325 MG PO TABS
1.0000 | ORAL_TABLET | Freq: Two times a day (BID) | ORAL | 0 refills | Status: DC | PRN
  Filled 2022-07-09 – 2022-07-13 (×3): qty 60, 30d supply, fill #0

## 2022-07-12 ENCOUNTER — Other Ambulatory Visit (HOSPITAL_COMMUNITY): Payer: Self-pay

## 2022-07-13 ENCOUNTER — Other Ambulatory Visit (HOSPITAL_COMMUNITY): Payer: Self-pay

## 2022-07-21 ENCOUNTER — Other Ambulatory Visit (HOSPITAL_COMMUNITY): Payer: Self-pay

## 2022-07-21 MED ORDER — ESTRADIOL 10 MCG VA TABS
ORAL_TABLET | VAGINAL | 11 refills | Status: DC
Start: 2022-07-21 — End: 2023-07-28
  Filled 2022-07-21: qty 8, 28d supply, fill #0

## 2022-07-22 ENCOUNTER — Other Ambulatory Visit (HOSPITAL_COMMUNITY): Payer: Self-pay

## 2022-08-13 ENCOUNTER — Other Ambulatory Visit (HOSPITAL_COMMUNITY): Payer: Self-pay

## 2022-08-13 MED ORDER — HYDROCODONE-ACETAMINOPHEN 10-325 MG PO TABS
1.0000 | ORAL_TABLET | Freq: Three times a day (TID) | ORAL | 0 refills | Status: DC
  Filled 2022-08-13: qty 45, 15d supply, fill #0

## 2022-08-13 MED ORDER — NALOXONE HCL 4 MG/0.1ML NA LIQD
NASAL | 1 refills | Status: AC
  Filled 2022-08-13: qty 2, 30d supply, fill #0

## 2022-08-18 LAB — HM MAMMOGRAPHY

## 2022-08-20 ENCOUNTER — Encounter: Payer: Self-pay | Admitting: *Deleted

## 2022-08-26 ENCOUNTER — Other Ambulatory Visit (HOSPITAL_COMMUNITY): Payer: Self-pay

## 2022-08-26 MED ORDER — HYDROCODONE-ACETAMINOPHEN 10-325 MG PO TABS
1.0000 | ORAL_TABLET | Freq: Three times a day (TID) | ORAL | 0 refills | Status: DC | PRN
  Filled 2022-08-26: qty 90, 30d supply, fill #0

## 2022-09-08 ENCOUNTER — Other Ambulatory Visit (HOSPITAL_COMMUNITY): Payer: Self-pay

## 2022-09-08 MED ORDER — LIDOCAINE 5 % EX PTCH
1.0000 | MEDICATED_PATCH | CUTANEOUS | 0 refills | Status: DC
  Filled 2022-09-08: qty 20, 20d supply, fill #0

## 2022-09-09 ENCOUNTER — Other Ambulatory Visit (HOSPITAL_COMMUNITY): Payer: Self-pay

## 2022-09-27 ENCOUNTER — Other Ambulatory Visit (HOSPITAL_COMMUNITY): Payer: Self-pay

## 2022-09-27 MED ORDER — HYDROCODONE-ACETAMINOPHEN 10-325 MG PO TABS: 1.0000 | ORAL_TABLET | Freq: Four times a day (QID) | ORAL | 0 refills | Status: DC | PRN

## 2022-10-13 ENCOUNTER — Telehealth: Payer: Self-pay | Admitting: Adult Health

## 2022-10-13 NOTE — Telephone Encounter (Signed)
Rescheduled appointment per provider admin time. Patient is aware of the changes made to her upcoming appointment.

## 2022-11-04 ENCOUNTER — Other Ambulatory Visit (HOSPITAL_COMMUNITY): Payer: Self-pay

## 2022-11-04 MED ORDER — LIDOCAINE 5 % EX PTCH
MEDICATED_PATCH | CUTANEOUS | 0 refills | Status: AC
  Filled 2022-11-04: qty 20, 20d supply, fill #0

## 2022-11-05 ENCOUNTER — Other Ambulatory Visit (HOSPITAL_COMMUNITY): Payer: Self-pay

## 2022-11-13 ENCOUNTER — Other Ambulatory Visit (HOSPITAL_COMMUNITY): Payer: Self-pay

## 2023-01-03 DIAGNOSIS — M545 Low back pain, unspecified: Secondary | ICD-10-CM | POA: Diagnosis not present

## 2023-01-07 ENCOUNTER — Telehealth: Payer: Self-pay | Admitting: Adult Health

## 2023-01-07 NOTE — Telephone Encounter (Signed)
Rescheduled appointment per provider culture meeting. Patient is aware of the changes made to her upcoming appointment.

## 2023-01-12 ENCOUNTER — Encounter: Payer: 59 | Admitting: Adult Health

## 2023-01-13 ENCOUNTER — Inpatient Hospital Stay: Payer: 59 | Attending: Adult Health | Admitting: Adult Health

## 2023-01-13 ENCOUNTER — Inpatient Hospital Stay: Payer: 59 | Admitting: Adult Health

## 2023-01-13 DIAGNOSIS — Z923 Personal history of irradiation: Secondary | ICD-10-CM | POA: Insufficient documentation

## 2023-01-13 DIAGNOSIS — Z9221 Personal history of antineoplastic chemotherapy: Secondary | ICD-10-CM | POA: Insufficient documentation

## 2023-01-13 DIAGNOSIS — Z853 Personal history of malignant neoplasm of breast: Secondary | ICD-10-CM | POA: Insufficient documentation

## 2023-01-18 ENCOUNTER — Inpatient Hospital Stay (HOSPITAL_BASED_OUTPATIENT_CLINIC_OR_DEPARTMENT_OTHER): Payer: 59 | Admitting: Adult Health

## 2023-01-18 ENCOUNTER — Encounter: Payer: Self-pay | Admitting: Adult Health

## 2023-01-18 VITALS — BP 150/88 | HR 76 | Temp 97.6°F | Resp 18 | Ht 66.0 in | Wt 167.2 lb

## 2023-01-18 DIAGNOSIS — Z853 Personal history of malignant neoplasm of breast: Secondary | ICD-10-CM | POA: Diagnosis not present

## 2023-01-18 DIAGNOSIS — Z170 Estrogen receptor positive status [ER+]: Secondary | ICD-10-CM | POA: Diagnosis not present

## 2023-01-18 DIAGNOSIS — C50512 Malignant neoplasm of lower-outer quadrant of left female breast: Secondary | ICD-10-CM

## 2023-01-18 DIAGNOSIS — Z923 Personal history of irradiation: Secondary | ICD-10-CM | POA: Diagnosis not present

## 2023-01-18 DIAGNOSIS — E559 Vitamin D deficiency, unspecified: Secondary | ICD-10-CM | POA: Insufficient documentation

## 2023-01-18 DIAGNOSIS — G4700 Insomnia, unspecified: Secondary | ICD-10-CM | POA: Insufficient documentation

## 2023-01-18 DIAGNOSIS — Z9221 Personal history of antineoplastic chemotherapy: Secondary | ICD-10-CM | POA: Diagnosis not present

## 2023-01-18 NOTE — Assessment & Plan Note (Signed)
Joann Page is a 62 year old woman with history of stage Ia left-sided breast cancer that was estrogen positive diagnosed in August 2013.  She is status post lumpectomy followed by adjuvant chemotherapy followed by adjuvant radiation, followed by adjuvant tamoxifen x 7 years that she completed in 2022.  She continues on observation alone and has no clinical or radiographic sign of breast cancer recurrence.  I recommended that she undergo an annual mammogram which is next due in September 2024.  We reviewed her most recent mammogram which indicates breast density category A which means that mammograms are a good screening modality for her.  I recommended continued healthy diet and exercise and that she continue to follow-up with her primary care for provider for continued health maintenance and preventative care.  She would like to see [place] back on an annual basis and we are happy to accommodate this.  She will return in 1 year for continued long-term surveillance.

## 2023-01-18 NOTE — Progress Notes (Signed)
[facility] Follow up:    Joann Koch, MD [place] [address]   DIAGNOSIS: History of breast cancer  SUMMARY OF ONCOLOGIC HISTORY: Oncology History  HX: breast cancer  07/03/2012 Initial Diagnosis   Left: IDC, MRI 2.3 cm mass   07/17/2012 Surgery   Left Lumpectomy: 1.7 cm, IDC, High grade, 4 SN neg,  with HG DCIS with necrosis, Oncotype Dx Intermed grade   07/31/2012 - 11/23/2012 Chemotherapy   Adjuvant chemo TC x 4   11/30/2012 - 01/12/2013 Radiation Therapy   Adjuvant XRT   01/18/2013 - 12/2020 Anti-estrogen oral therapy   Adjuvant  tamoxifen 10 years     CURRENT THERAPY: Observation  INTERVAL HISTORY: Joann Page 62 y.o. female returns for follow-up of her history of breast cancer.  She is doing well.  She tells me that she is exercising by walking 3 miles per day.  Her most recent mammogram occurred in September 2023 and demonstrated no mammographic evidence of malignancy and breast density category A.  Continues to see her primary care provider regularly and is up-to-date with her health maintenance.   Patient Active Problem List   Diagnosis Date Noted   Vitamin D deficiency 01/18/2023   Insomnia 01/18/2023   Aortic atherosclerosis ([place]) 03/19/2021   Essential hypertension 11/03/2017   Routine general medical examination at a health care facility 01/02/2016   Arthritis    GERD (gastroesophageal reflux disease)    HX: breast cancer 07/10/2012    is allergic to sulfa antibiotics.  MEDICAL HISTORY: Past Medical History:  Diagnosis Date   Anxiety    Arthritis 2010   lumbar spine   Breast cancer ([facility]) 07/17/12   at age 62, ER/PR +, hER 2 -   Cancer ([facility]) 07/07/12   Left Breast Inv Ductal Ca.   Degenerative disk disease 2010   Lumbar spine   Depression    GERD (gastroesophageal reflux disease)    hx   H/O hiatal hernia    small   Hyperlipidemia    Knee pain, right    No pertinent past medical history     Obesity    S/P radiation therapy 11/30/12 - 01/12/13   Left Breast / 50 gray in 25 Fractions with boost 10 gray in 5 Fractions   Status post chemotherapy comp 10/27/12    4 CyclesTaxotere/ Cytoxan   Status post chemotherapy Comp. 01/12/13   neoadjuvant chemotherapy: 4 Cycles of Taxotere/Cytoxan   Use of tamoxifen (Nolvadex) 01/18/13    SURGICAL HISTORY: Past Surgical History:  Procedure Laterality Date   ABDOMINAL HYSTERECTOMY  04/2000   partial, fibroids   BREAST LUMPECTOMY  07/17/2012   Left Breast: Invasive ductal Cracinoma, 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   BREATH TEK H PYLORI  12/07/2011   Procedure: BREATH TEK H PYLORI;  Surgeon: Shann Medal, MD;  Location: [facility];  Service: Endoscopy;  Laterality: N/A;   COLONOSCOPY  08/25/2016   Dr.Nandigam   Needle Core Biopsy  06/30/2012   Left Breast: 12 O'clock/ Benign Parenchyma    SOCIAL HISTORY: Social History   Socioeconomic History   Marital status: Divorced    Spouse name: Not on file   Number of children: 1   Years of education: Not on file   Highest education level: Associate degree: occupational,  technical, or vocational program  Occupational History   Occupation: Patent attorney  Tobacco Use   Smoking status: Never   Smokeless tobacco: Never  Vaping Use   Vaping Use: Never used  Substance and Sexual Activity   Alcohol use: No   Drug use: Yes    Comment: have drinks monthly   Sexual activity: Yes  Other Topics Concern   Not on file  Social History Narrative   Married for 20+ years, divorced Dec 2017   Living on own      Social Determinants of Health   Financial Resource Strain: High Risk (05/27/2018)   Overall Financial Resource Strain (CARDIA)    Difficulty of Paying Living Expenses: Hard  Food Insecurity: Food  Insecurity Present (05/27/2018)   Hunger Vital Sign    Worried About [place] in the Last Year: Often true    Ran Out of Food in the Last Year: Often true  Transportation Needs: No Transportation Needs (05/27/2018)   PRAPARE - Hydrologist (Medical): No    Lack of Transportation (Non-Medical): No  Physical Activity: Inactive (05/27/2018)   Exercise Vital Sign    Days of Exercise per Week: 0 days    Minutes of Exercise per Session: 0 min  Stress: Stress Concern Present (05/27/2018)   [place]    Feeling of Stress : Rather much  Social Connections: Socially Isolated (05/27/2018)   Social Connection and Isolation Panel [NHANES]    Frequency of Communication with Friends and Family: Never    Frequency of Social Gatherings with Friends and Family: Never    Attends Religious Services: Never    Marine scientist or Organizations: No    Attends Archivist Meetings: Never    Marital Status: Separated  Intimate Partner Violence: Not At Risk (05/27/2018)   Humiliation, Afraid, Rape, and Kick questionnaire    Fear of Current or Ex-Partner: No    Emotionally Abused: No    Physically Abused: No    Sexually Abused: No    FAMILY HISTORY: Family History  Problem Relation Age of Onset   Colon cancer Father 72   Alcohol abuse Father    Colon polyps Brother    Anxiety disorder Brother    Depression Brother    Breast cancer Sister 37   Obesity Sister    Anxiety disorder Sister    Depression Sister    Anxiety disorder Mother    Depression Mother    Bipolar disorder Mother    Lymphoma Brother    Drug abuse Brother    Anxiety disorder Sister    Depression Sister    Bipolar disorder Sister    Esophageal cancer Neg Hx    Stomach cancer Neg Hx    Rectal cancer Neg Hx     Review of Systems  Constitutional:  Negative for appetite change, chills, fatigue, fever and unexpected  weight change.  HENT:   Negative for hearing loss, lump/mass and trouble swallowing.   Eyes:  Negative for eye problems and icterus.  Respiratory:  Negative for chest tightness, cough and shortness of breath.   Cardiovascular:  Negative for chest pain, leg swelling and palpitations.  Gastrointestinal:  Negative for abdominal distention, abdominal pain, constipation, diarrhea, nausea and vomiting.  Endocrine: Negative for hot flashes.  Genitourinary:  Negative for difficulty urinating.   Musculoskeletal:  Negative for arthralgias.  Skin:  Negative for itching and  rash.  Neurological:  Negative for dizziness, extremity weakness, headaches and numbness.  Hematological:  Negative for adenopathy. Does not bruise/bleed easily.  Psychiatric/Behavioral:  Negative for depression. The patient is not nervous/anxious.       PHYSICAL EXAMINATION  ECOG PERFORMANCE STATUS: 0 - Asymptomatic  Vitals:   01/18/23 1444  BP: (!) 150/88  Pulse: 76  Resp: 18  Temp: 97.6 F (36.4 C)  SpO2: 100%    Physical Exam Constitutional:      General: She is not in acute distress.    Appearance: Normal appearance. She is not toxic-appearing.  HENT:     Head: Normocephalic and atraumatic.  Eyes:     General: No scleral icterus. Cardiovascular:     Rate and Rhythm: Normal rate and regular rhythm.     Pulses: Normal pulses.     Heart sounds: Normal heart sounds.  Pulmonary:     Effort: Pulmonary effort is normal.     Breath sounds: Normal breath sounds.  Chest:     Comments: Right breast is benign left breast status postlumpectomy and radiation no sign of local recurrence. Abdominal:     General: Abdomen is flat. Bowel sounds are normal. There is no distension.     Palpations: Abdomen is soft.     Tenderness: There is no abdominal tenderness.  Musculoskeletal:        General: No swelling.     Cervical back: Neck supple.  Lymphadenopathy:     Cervical: No cervical adenopathy.  Skin:    General:  Skin is warm and dry.     Findings: No rash.  Neurological:     General: No focal deficit present.     Mental Status: She is alert.  Psychiatric:        Mood and Affect: Mood normal.        Behavior: Behavior normal.     LABORATORY DATA:  None for this visit  ASSESSMENT and THERAPY PLAN:   HX: breast cancer Joann Page is a 63 year old woman with history of stage Ia left-sided breast cancer that was estrogen positive diagnosed in August 2013.  She is status post lumpectomy followed by adjuvant chemotherapy followed by adjuvant radiation, followed by adjuvant tamoxifen x 7 years that she completed in 2022.  She continues on observation alone and has no clinical or radiographic sign of breast cancer recurrence.  I recommended that she undergo an annual mammogram which is next due in September 2024.  We reviewed her most recent mammogram which indicates breast density category A which means that mammograms are a good screening modality for her.  I recommended continued healthy diet and exercise and that she continue to follow-up with her primary care for provider for continued health maintenance and preventative care.  She would like to see [place] back on an annual basis and we are happy to accommodate this.  She will return in 1 year for continued long-term surveillance.    All questions were answered. The patient knows to call the clinic with any problems, questions or concerns. We can certainly see the patient much sooner if necessary.  Total encounter time:20 minutes*in face-to-face visit time, chart review, lab review, care coordination, order entry, and documentation of the encounter time.    Wilber Bihari, NP 01/18/23 3:42 PM Medical Oncology and Hematology [facility] [place], [place] [address] Tel. [phone]    Fax. [phone]  *Total Encounter Time as defined by the Centers for Medicare and Medicaid Services includes,  in addition to the  face-to-face time of a patient visit (documented in the note above) non-face-to-face time: obtaining and reviewing outside history, ordering and reviewing medications, tests or procedures, care coordination (communications with other health care professionals or caregivers) and documentation in the medical record.

## 2023-01-19 ENCOUNTER — Telehealth: Payer: Self-pay | Admitting: Adult Health

## 2023-01-19 NOTE — Telephone Encounter (Signed)
Scheduled appointment per 2/21 los. Patient is aware.

## 2023-04-05 ENCOUNTER — Other Ambulatory Visit: Payer: Self-pay | Admitting: Internal Medicine

## 2023-04-05 ENCOUNTER — Encounter: Payer: Self-pay | Admitting: Internal Medicine

## 2023-04-05 ENCOUNTER — Ambulatory Visit (INDEPENDENT_AMBULATORY_CARE_PROVIDER_SITE_OTHER): Payer: 59 | Admitting: Internal Medicine

## 2023-04-05 VITALS — BP 122/82 | HR 70 | Temp 97.6°F | Ht 66.0 in | Wt 160.0 lb

## 2023-04-05 DIAGNOSIS — E663 Overweight: Secondary | ICD-10-CM

## 2023-04-05 DIAGNOSIS — K219 Gastro-esophageal reflux disease without esophagitis: Secondary | ICD-10-CM

## 2023-04-05 DIAGNOSIS — I10 Essential (primary) hypertension: Secondary | ICD-10-CM | POA: Diagnosis not present

## 2023-04-05 DIAGNOSIS — Z0000 Encounter for general adult medical examination without abnormal findings: Secondary | ICD-10-CM

## 2023-04-05 DIAGNOSIS — E559 Vitamin D deficiency, unspecified: Secondary | ICD-10-CM

## 2023-04-05 DIAGNOSIS — E785 Hyperlipidemia, unspecified: Secondary | ICD-10-CM | POA: Insufficient documentation

## 2023-04-05 DIAGNOSIS — E782 Mixed hyperlipidemia: Secondary | ICD-10-CM | POA: Diagnosis not present

## 2023-04-05 DIAGNOSIS — I700 Atherosclerosis of aorta: Secondary | ICD-10-CM

## 2023-04-05 LAB — COMPREHENSIVE METABOLIC PANEL
ALT: 20 U/L (ref 0–35)
AST: 20 U/L (ref 0–37)
Albumin: 4 g/dL (ref 3.5–5.2)
Alkaline Phosphatase: 50 U/L (ref 39–117)
BUN: 7 mg/dL (ref 6–23)
CO2: 28 mEq/L (ref 19–32)
Calcium: 9.2 mg/dL (ref 8.4–10.5)
Chloride: 98 mEq/L (ref 96–112)
Creatinine, Ser: 0.73 mg/dL (ref 0.40–1.20)
GFR: 89.49 mL/min (ref >60.00)
Glucose, Bld: 82 mg/dL (ref 70–99)
Potassium: 3.8 mEq/L (ref 3.5–5.1)
Sodium: 135 mEq/L (ref 135–145)
Total Bilirubin: 0.6 mg/dL (ref 0.2–1.2)
Total Protein: 6.7 g/dL (ref 6.0–8.3)

## 2023-04-05 LAB — HEMOGLOBIN A1C: Hgb A1c MFr Bld: 5.5 % (ref 4.6–6.5)

## 2023-04-05 LAB — CBC
HCT: 42.4 % (ref 36.0–46.0)
Hemoglobin: 14.5 g/dL (ref 12.0–15.0)
MCHC: 34.3 g/dL (ref 30.0–36.0)
MCV: 91.8 fl (ref 78.0–100.0)
Platelets: 219 10*3/uL (ref 150.0–400.0)
RBC: 4.62 Mil/uL (ref 3.87–5.11)
RDW: 12.1 % (ref 11.5–15.5)
WBC: 4.9 10*3/uL (ref 4.0–10.5)

## 2023-04-05 LAB — LIPID PANEL
Cholesterol: 207 mg/dL — ABNORMAL HIGH (ref 0–200)
HDL: 58.8 mg/dL (ref >39.00)
LDL Cholesterol: 125 mg/dL — ABNORMAL HIGH (ref 0–99)
NonHDL: 148.27
Total CHOL/HDL Ratio: 4
Triglycerides: 115 mg/dL (ref 0.0–149.0)
VLDL: 23 mg/dL (ref 0.0–40.0)

## 2023-04-05 LAB — VITAMIN D 25 HYDROXY (VIT D DEFICIENCY, FRACTURES): VITD: 38.2 ng/mL (ref 30.00–100.00)

## 2023-04-05 MED ORDER — PANTOPRAZOLE SODIUM 40 MG PO TBEC: 40.0000 mg | DELAYED_RELEASE_TABLET | Freq: Every day | ORAL | 3 refills | Status: DC

## 2023-04-05 MED ORDER — ZEPBOUND 7.5 MG/0.5ML ~~LOC~~ SOAJ: 7.5000 mg | SUBCUTANEOUS | 0 refills | Status: DC

## 2023-04-05 MED ORDER — VITAMIN B-12 1000 MCG PO TABS: 1000.0000 ug | ORAL_TABLET | Freq: Every day | ORAL | 3 refills | Status: DC

## 2023-04-05 MED ORDER — ZEPBOUND 2.5 MG/0.5ML ~~LOC~~ SOAJ: 2.5000 mg | SUBCUTANEOUS | 0 refills | Status: DC

## 2023-04-05 MED ORDER — HYDROCHLOROTHIAZIDE 25 MG PO TABS: 25.0000 mg | ORAL_TABLET | Freq: Every day | ORAL | 3 refills | Status: DC

## 2023-04-05 MED ORDER — PRAVASTATIN SODIUM 20 MG PO TABS: 20.0000 mg | ORAL_TABLET | Freq: Every day | ORAL | 3 refills | Status: DC

## 2023-04-05 MED ORDER — ZEPBOUND 5 MG/0.5ML ~~LOC~~ SOAJ: 5.0000 mg | SUBCUTANEOUS | 0 refills | Status: DC

## 2023-04-05 NOTE — Assessment & Plan Note (Signed)
Checking vitamin D and adjust as needed.  

## 2023-04-05 NOTE — Assessment & Plan Note (Signed)
Flu shot yearly. Shingrix complete. Tetanus up to date. Colonoscopy up to date. Mammogram up to date, pap smear up to date. Counseled about sun safety and mole surveillance. Counseled about the dangers of distracted driving. Given 10 year screening recommendations.  

## 2023-04-05 NOTE — Assessment & Plan Note (Signed)
BP at goal and continue hctz 25 mg daily. Checking CMP and adjust as needed.

## 2023-04-05 NOTE — Assessment & Plan Note (Signed)
Checking lipid panel and adjust pravastatin as needed.  

## 2023-04-05 NOTE — Assessment & Plan Note (Signed)
Is working on weight loss and down about 50 pounds overall. Would like to use zepbound to stay on track first 3 months prescribed.

## 2023-04-05 NOTE — Assessment & Plan Note (Signed)
Checking lipid panel and adjust pravastatin 20 mg daily as needed for LDL <100 goal.

## 2023-04-05 NOTE — Assessment & Plan Note (Signed)
Taking protonix 40 mg daily and will continue.  ?

## 2023-04-05 NOTE — Progress Notes (Signed)
   Subjective:   Patient ID: Joann Page, female    DOB: 04-Mar-1961, 62 y.o.   MRN: [id number]  HPI The patient is here for physical.  PMH, [facility], social history reviewed and updated  Review of Systems  Constitutional: Negative.   HENT: Negative.    Eyes: Negative.   Respiratory:  Negative for cough, chest tightness and shortness of breath.   Cardiovascular:  Negative for chest pain, palpitations and leg swelling.  Gastrointestinal:  Negative for abdominal distention, abdominal pain, constipation, diarrhea, nausea and vomiting.  Musculoskeletal: Negative.   Skin: Negative.   Neurological: Negative.   Psychiatric/Behavioral: Negative.      Objective:  Physical Exam Constitutional:      Appearance: She is well-developed.  HENT:     Head: Normocephalic and atraumatic.  Cardiovascular:     Rate and Rhythm: Normal rate and regular rhythm.  Pulmonary:     Effort: Pulmonary effort is normal. No respiratory distress.     Breath sounds: Normal breath sounds. No wheezing or rales.  Abdominal:     General: Bowel sounds are normal. There is no distension.     Palpations: Abdomen is soft.     Tenderness: There is no abdominal tenderness. There is no rebound.  Musculoskeletal:     Cervical back: Normal range of motion.  Skin:    General: Skin is warm and dry.  Neurological:     Mental Status: She is alert and oriented to person, place, and time.     Coordination: Coordination normal.     Vitals:   04/05/23 1056 04/05/23 1101 04/05/23 1122  BP: (!) 158/100 (!) 158/100 122/82  Pulse: 70    Temp: 97.6 F (36.4 C)    TempSrc: Oral    SpO2: 97%    Weight: 160 lb (72.6 kg)    Height: 5\' 6"  (1.676 m)      Assessment & Plan:

## 2023-04-05 NOTE — Patient Instructions (Signed)
Keep monitoring BP every once in a while to see what this is running at home.

## 2023-04-06 ENCOUNTER — Telehealth: Payer: Self-pay | Admitting: Internal Medicine

## 2023-04-06 NOTE — Telephone Encounter (Signed)
Patient would like a call back to discuss lab results from 04/05/23. Best call back is [phone].

## 2023-04-06 NOTE — Telephone Encounter (Signed)
Rec'd msg THE PRESCRIBED MEDICATION IS NOT COVERED BY INSURANCE. PLEASE CONSIDER CHANGING TO ONE OF THE SUGGESTED COVERED ALTERNATIVES.

## 2023-04-06 NOTE — Telephone Encounter (Signed)
Called pt she is concern about her cholesterol wanting to know is there something else she can take. Currently taking her pravastatin. This is duplicate msg pt already spoke w/ MD nurse and note was sent to her. Closing encounter

## 2023-04-08 ENCOUNTER — Other Ambulatory Visit: Payer: Self-pay | Admitting: Internal Medicine

## 2023-04-08 MED ORDER — PRAVASTATIN SODIUM 40 MG PO TABS: 40.0000 mg | ORAL_TABLET | Freq: Every day | ORAL | 3 refills | Status: DC

## 2023-05-03 DIAGNOSIS — Z79899 Other long term (current) drug therapy: Secondary | ICD-10-CM | POA: Diagnosis not present

## 2023-05-30 DIAGNOSIS — M545 Low back pain, unspecified: Secondary | ICD-10-CM | POA: Diagnosis not present

## 2023-05-30 DIAGNOSIS — M7541 Impingement syndrome of right shoulder: Secondary | ICD-10-CM | POA: Diagnosis not present

## 2023-05-30 DIAGNOSIS — Z419 Encounter for procedure for purposes other than remedying health state, unspecified: Secondary | ICD-10-CM | POA: Diagnosis not present

## 2023-06-30 DIAGNOSIS — Z419 Encounter for procedure for purposes other than remedying health state, unspecified: Secondary | ICD-10-CM | POA: Diagnosis not present

## 2023-07-06 ENCOUNTER — Ambulatory Visit (INDEPENDENT_AMBULATORY_CARE_PROVIDER_SITE_OTHER): Payer: 59 | Admitting: Internal Medicine

## 2023-07-06 ENCOUNTER — Encounter: Payer: Self-pay | Admitting: Internal Medicine

## 2023-07-06 ENCOUNTER — Encounter: Payer: Self-pay | Admitting: Physical Medicine & Rehabilitation

## 2023-07-06 VITALS — BP 118/88 | HR 90 | Temp 98.2°F | Ht 66.0 in | Wt 158.0 lb

## 2023-07-06 DIAGNOSIS — D692 Other nonthrombocytopenic purpura: Secondary | ICD-10-CM

## 2023-07-06 NOTE — Progress Notes (Signed)
   Subjective:   Patient ID: Joann Page, female    DOB: Jul 15, 1961, 62 y.o.   MRN: [id number]  HPI The patient is a 62 YO female coming in for red spots on the arms which come and go. No itching, pain or scarring, typically last 1 week or less.   Review of Systems  Constitutional: Negative.   HENT: Negative.    Eyes: Negative.   Respiratory:  Negative for cough, chest tightness and shortness of breath.   Cardiovascular:  Negative for chest pain, palpitations and leg swelling.  Gastrointestinal:  Negative for abdominal distention, abdominal pain, constipation, diarrhea, nausea and vomiting.  Musculoskeletal: Negative.   Skin:  Positive for color change.  Neurological: Negative.   Psychiatric/Behavioral: Negative.      Objective:  Physical Exam Constitutional:      Appearance: She is well-developed.  HENT:     Head: Normocephalic and atraumatic.  Cardiovascular:     Rate and Rhythm: Normal rate and regular rhythm.  Pulmonary:     Effort: Pulmonary effort is normal. No respiratory distress.     Breath sounds: Normal breath sounds. No wheezing or rales.  Abdominal:     General: Bowel sounds are normal. There is no distension.     Palpations: Abdomen is soft.     Tenderness: There is no abdominal tenderness. There is no rebound.  Musculoskeletal:     Cervical back: Normal range of motion.  Skin:    General: Skin is warm and dry.     Comments: Small purpura on the forearms  Neurological:     Mental Status: She is alert and oriented to person, place, and time.     Coordination: Coordination normal.     Vitals:   07/06/23 0923  BP: 118/88  Pulse: 90  Temp: 98.2 F (36.8 C)  TempSrc: Oral  SpO2: 99%  Weight: 158 lb (71.7 kg)  Height: 5\' 6"  (1.676 m)    Assessment & Plan:  Visit time 15 minutes in face to face communication with patient and coordination of care, additional 5 minutes spent in record review, coordination or care, ordering tests, communicating/referring  to other healthcare professionals, documenting in medical records all on the same day of the visit for total time 20 minutes spent on the visit.

## 2023-07-06 NOTE — Assessment & Plan Note (Signed)
She has some purpura and we discussed etiology and normal recent CBC. No further testing is indicated and reassurance given.

## 2023-07-28 ENCOUNTER — Encounter: Payer: 59 | Attending: Physical Medicine & Rehabilitation | Admitting: Physical Medicine & Rehabilitation

## 2023-07-28 ENCOUNTER — Encounter: Payer: Self-pay | Admitting: Physical Medicine & Rehabilitation

## 2023-07-28 VITALS — BP 136/89 | HR 65 | Ht 66.0 in | Wt 162.6 lb

## 2023-07-28 DIAGNOSIS — G894 Chronic pain syndrome: Secondary | ICD-10-CM | POA: Diagnosis not present

## 2023-07-28 DIAGNOSIS — Z79891 Long term (current) use of opiate analgesic: Secondary | ICD-10-CM

## 2023-07-28 DIAGNOSIS — Z5181 Encounter for therapeutic drug level monitoring: Secondary | ICD-10-CM | POA: Diagnosis present

## 2023-07-28 DIAGNOSIS — G8929 Other chronic pain: Secondary | ICD-10-CM | POA: Diagnosis present

## 2023-07-28 DIAGNOSIS — M549 Dorsalgia, unspecified: Secondary | ICD-10-CM | POA: Diagnosis not present

## 2023-07-28 MED ORDER — KETOROLAC TROMETHAMINE 60 MG/2ML IM SOLN
60.0000 mg | Freq: Once | INTRAMUSCULAR | Status: AC
  Administered 2023-07-28: 60 mg via INTRAMUSCULAR

## 2023-07-28 NOTE — Progress Notes (Addendum)
Subjective:    Patient ID: Joann Page, female    DOB: [birth year], 62 y.o.   MRN: [id number]  HPI CC:  Back and hip pain  Pt had been followed at [facility] clinic for pain after Dr Farris Has from [facility] management , non Nurse, learning disability with the exceptin of dual toradol / depomedrol injections from time to time  Weakness in LLE and pain in RLE  Occ numbness and tingling in feet but not in legs  No bowel or bladder dysfunction  No falls  Planning to do PT soon at [facility] Took ~ 3 Norco 10mg  tabs per day  Pt states she tried lumbar ESI in the past without relief and would not like to revisit  Opioid risk 0  Pt wants to avoid surgery  No recent MRI , most recent as below No problems with constipation uses Miralax   Treated by Dr Dion Saucier for shoulder impingement, has had shoulder corticosteroid injection  PT ordered but not commenced for LBP Narrative & Impression  CLINICAL DATA:  Low back pain   EXAM: MRI LUMBAR SPINE WITHOUT CONTRAST   TECHNIQUE: Multiplanar, multisequence MR imaging of the lumbar spine was performed. No intravenous contrast was administered.   COMPARISON:  Lumbar MRI 07/29/2013   FINDINGS: Segmentation:  Normal   Alignment: Mild retrolisthesis L3-4 unchanged. 7 mm anterolisthesis L4-5 unchanged.   Vertebrae:  Negative for fracture or mass.   Conus medullaris and cauda equina: Conus extends to the L1-2 level. Conus and cauda equina appear normal.   Paraspinal and other soft tissues: Negative for paraspinous mass or fluid collection   Disc levels:   T12-L1: Negative   L1-2: Negative   L2-3: Mild disc and facet degeneration without stenosis   L3-4: Slight retrolisthesis. Mild disc and facet degeneration without stenosis   L4-5: 7 mm anterolisthesis. Advanced facet and disc degeneration. Mild to moderate spinal stenosis has improved in the interval. Severe subarticular stenosis on the left is unchanged.  Moderate subarticular stenosis on the right is unchanged   L5-S1: Mild disc and facet degeneration. Negative for disc protrusion or stenosis.   IMPRESSION: Advanced disc and facet degeneration at L4-5 with 7 mm anterolisthesis. Mild to moderate spinal stenosis shows improvement. Severe subarticular stenosis on the left is unchanged and moderate subarticular stenosis on the right is unchanged   Mild degenerative changes elsewhere in the lumbar spine as described above.     Electronically Signed   By: Marlan Palau M.D.   On: 07/15/2018 07:31     Pain Inventory Average Pain 8 Pain Right Now 10 My pain is dull and tingling  In the last 24 hours, has pain interfered with the following? General activity 8 Relation with others 10 Enjoyment of life 0 What TIME of day is your pain at its worst? morning  Sleep (in general) Fair  Pain is worse with: bending, standing, and some activites Pain improves with: rest and pacing activities Relief from Meds: 10  walk without assistance walk with assistance how many minutes can you walk? 10 ability to climb steps?  yes do you drive?  yes Do you have any goals in this area?  yes  not employed: date last employed .  weakness numbness spasms  Any changes since last visit?  yes  Any changes since last visit?  yes was with ortho and Farris Has was managing her but she went to [place] and received injections--back didn't help but hip did...then her insurance changed and  they would not work with her. Here for pain management too.    Family History  Problem Relation Age of Onset   Colon cancer Father 38   Alcohol abuse Father    Colon polyps Brother    Anxiety disorder Brother    Depression Brother    Breast cancer Sister 91   Obesity Sister    Anxiety disorder Sister    Depression Sister    Anxiety disorder Mother    Depression Mother    Bipolar disorder Mother    Lymphoma Brother    Drug abuse Brother    Anxiety disorder  Sister    Depression Sister    Bipolar disorder Sister    Esophageal cancer Neg Hx    Stomach cancer Neg Hx    Rectal cancer Neg Hx    Social History   Socioeconomic History   Marital status: Divorced    Spouse name: Not on file   Number of children: 1   Years of education: Not on file   Highest education level: Associate degree: occupational, Scientist, product/process development, or vocational program  Occupational History   Occupation: Chief Financial Officer    CommentHydrologist  Tobacco Use   Smoking status: Never   Smokeless tobacco: Never  Vaping Use   Vaping status: Never Used  Substance and Sexual Activity   Alcohol use: No   Drug use: Yes    Comment: have drinks monthly   Sexual activity: Yes  Other Topics Concern   Not on file  Social History Narrative   Married for 20+ years, divorced Dec 2017   Living on own      Social Determinants of Health   Financial Resource Strain: High Risk (05/27/2018)   Overall Financial Resource Strain (CARDIA)    Difficulty of Paying Living Expenses: Hard  Food Insecurity: Food Insecurity Present (05/27/2018)   Hunger Vital Sign    Worried About Running Out of Food in the Last Year: Often true    Ran Out of Food in the Last Year: Often true  Transportation Needs: No Transportation Needs (05/27/2018)   PRAPARE - Administrator, Civil Service (Medical): No    Lack of Transportation (Non-Medical): No  Physical Activity: Inactive (05/27/2018)   Exercise Vital Sign    Days of Exercise per Week: 0 days    Minutes of Exercise per Session: 0 min  Stress: Stress Concern Present (05/27/2018)   [employer] of Occupational Health - Occupational Stress Questionnaire    Feeling of Stress : Rather much  Social Connections: Socially Isolated (05/27/2018)   Social Connection and Isolation Panel [NHANES]    Frequency of Communication with Friends and Family: Never    Frequency of Social Gatherings with Friends and Family: Never    Attends Religious Services: Never     Database administrator or Organizations: No    Attends Engineer, structural: Never    Marital Status: Separated   Past Surgical History:  Procedure Laterality Date   ABDOMINAL HYSTERECTOMY  04/2000   partial, fibroids   BREAST LUMPECTOMY  07/17/2012   Left Breast: Invasive ductal Cracinoma, 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   Degenerative disk disease 2010   Lumbar spine   Depression    GERD (gastroesophageal reflux disease)    hx   H/O hiatal hernia    small   Hyperlipidemia    Knee pain, right    No pertinent past medical history    Obesity    S/P radiation therapy 11/30/12 - 01/12/13   Left Breast / 50 gray in 25 Fractions with boost 10 gray in 5 Fractions   Status post chemotherapy comp 10/27/12    4 CyclesTaxotere/ Cytoxan   Status post chemotherapy Comp. 01/12/13   neoadjuvant chemotherapy: 4 Cycles of Taxotere/Cytoxan   Use of tamoxifen (Nolvadex) 01/18/13   BP 136/89   Pulse 65   Ht 5\' 6"  (1.676 m)   Wt 162 lb 9.6 oz (73.8 kg)   SpO2 98%   BMI 26.24 kg/m   Opioid Risk Score:   Fall Risk Score:  `1  Depression screen [facility] 2/9     07/28/2023   10:45 AM 04/05/2023   11:07 AM 03/31/2022    8:59 AM 03/17/2021    3:51 PM 06/05/2020    2:45 PM 06/04/2019    3:49 PM 04/22/2017   10:50 AM  Depression screen PHQ 2/9   Decreased Interest 1 0 0 0 0 1 1  Down, Depressed, Hopeless 0 0 0 0 0 2 0  PHQ - 2 Score 1 0 0 0 0 3 1  Altered sleeping 0 0 0 0 0 1 0  Tired, decreased energy 2 0 0 0 0 0 1  Change in appetite 0 0 0 0 0 0 3  Feeling bad or failure about yourself  0 0 0 0 0 1 1  Trouble concentrating 0 0 0 0 0 2 0  Moving slowly or fidgety/restless 0 0 0 0 0 0 0  Suicidal thoughts 0 0 0 0 0 0 0  PHQ-9 Score 3 0 0 0 0 7 6  Difficult doing work/chores  Not difficult at all Not difficult at all         Review of Systems  Constitutional: Negative.   HENT: Negative.    Eyes: Negative.   Respiratory: Negative.    Cardiovascular: Negative.   Gastrointestinal: Negative.   Endocrine: Negative.   Genitourinary: Negative.   Musculoskeletal:  Positive for back pain.  Skin: Negative.   Allergic/Immunologic: Negative.   Neurological: Negative.   Hematological: Negative.   Psychiatric/Behavioral: Negative.    All other systems reviewed and are negative.      Objective:   Physical Exam Vitals and nursing note reviewed.  Constitutional:      Appearance: Normal appearance.  HENT:     Head: Normocephalic and atraumatic.  Eyes:     Extraocular Movements: Extraocular movements intact.     Conjunctiva/sclera: Conjunctivae normal.     Pupils: Pupils are equal, round, and reactive to light.  Neurological:     General: No focal deficit present.     Mental Status: She is alert and oriented to person, place, and time.     Cranial Nerves: No dysarthria  or facial asymmetry.     Sensory: Sensation is intact.     Motor: Motor function is intact.     Coordination: Coordination is intact.     Gait: Gait is intact.     Comments: Neuro:  Eyes without evidence of nystagmus  Tone is normal without evidence of spasticity Cerebellar exam shows no evidence of ataxia on finger nose finger or heel to shin testing No evidence of trunkal ataxia  Motor strength is 5/5 in bilateral deltoid, biceps, triceps, finger  flexors and extensors, wrist flexors and extensors, hip flexors, knee flexors and extensors, ankle dorsiflexors, plantar flexors, invertors and evertors, toe flexors and extensors  Sensory exam is normal to pinprick, proprioception and light touch in the upper and lower limbs         Psychiatric:        Mood and Affect: Mood normal.        Behavior: Behavior normal.           Assessment & Plan:    Lumbar spinal stenosis- likely progressive since last MRI in 2019, Pt will start PT which is appropriate, check UDS , if appropriate would start tramadol vs low dose hydrocodone, if radicular discomfort increases consider trial gabapentin vs pregabalin  UDS appropriate has taken ~40tab hydrocodone per month since 5/31 rx which is 6.67 MME/d, will start tramadol 50mg  QID in place of hydrocodone  F/u after PT, if no better consider repeat lumbar MRI,  Pt would consider minimally invasive surgery but has too much instability to avoid fusion , some less invasive options are available per Dr Marcell Barlow Given toradol 60mg  IM today  As d/w pt do not feel combination with steroid is indicated at this time

## 2023-07-28 NOTE — Patient Instructions (Addendum)
  Endoscopic decompression of Lumbar spine for spinal stenosis , Will check if anyone in [facility] system will do this.    Please do PT in the meantime

## 2023-07-31 DIAGNOSIS — Z419 Encounter for procedure for purposes other than remedying health state, unspecified: Secondary | ICD-10-CM | POA: Diagnosis not present

## 2023-08-04 LAB — TOXASSURE SELECT,+ANTIDEPR,UR

## 2023-08-08 ENCOUNTER — Telehealth: Payer: Self-pay | Admitting: Physical Medicine & Rehabilitation

## 2023-08-08 MED ORDER — TRAMADOL HCL 50 MG PO TABS
50.0000 mg | ORAL_TABLET | Freq: Four times a day (QID) | ORAL | 1 refills | Status: DC
Start: 2023-08-08 — End: 2023-09-02

## 2023-08-08 NOTE — Telephone Encounter (Signed)
Patient would like to know results of her UDS

## 2023-08-08 NOTE — Addendum Note (Signed)
Addended by: Erick Colace on: 08/08/2023 01:07 PM   Modules accepted: Orders

## 2023-08-09 NOTE — Telephone Encounter (Signed)
Notified. 

## 2023-08-09 NOTE — Telephone Encounter (Signed)
Joann Page stated she's a 2013 Breast Cancer Survivor. She read Tramadol and breast feeding is not recommended. Is it safe for her to take the Tramadol as a Breast Cancer Survivor? (She is not breast feeding).  Please advise.   Call back phone [phone].

## 2023-08-22 LAB — HM MAMMOGRAPHY

## 2023-08-23 ENCOUNTER — Encounter: Payer: Self-pay | Admitting: Internal Medicine

## 2023-08-30 ENCOUNTER — Encounter: Payer: 59 | Admitting: Registered Nurse

## 2023-08-30 DIAGNOSIS — Z419 Encounter for procedure for purposes other than remedying health state, unspecified: Secondary | ICD-10-CM | POA: Diagnosis not present

## 2023-09-02 ENCOUNTER — Encounter: Payer: Self-pay | Admitting: Registered Nurse

## 2023-09-02 ENCOUNTER — Encounter: Payer: 59 | Attending: Physical Medicine & Rehabilitation | Admitting: Registered Nurse

## 2023-09-02 VITALS — BP 131/86 | Ht 66.0 in | Wt 164.0 lb

## 2023-09-02 DIAGNOSIS — Z79891 Long term (current) use of opiate analgesic: Secondary | ICD-10-CM | POA: Diagnosis present

## 2023-09-02 DIAGNOSIS — M5416 Radiculopathy, lumbar region: Secondary | ICD-10-CM | POA: Diagnosis not present

## 2023-09-02 DIAGNOSIS — M48061 Spinal stenosis, lumbar region without neurogenic claudication: Secondary | ICD-10-CM | POA: Diagnosis not present

## 2023-09-02 DIAGNOSIS — G894 Chronic pain syndrome: Secondary | ICD-10-CM | POA: Insufficient documentation

## 2023-09-02 DIAGNOSIS — Z5181 Encounter for therapeutic drug level monitoring: Secondary | ICD-10-CM | POA: Diagnosis not present

## 2023-09-02 MED ORDER — HYDROCODONE-ACETAMINOPHEN 10-325 MG PO TABS: 1.0000 | ORAL_TABLET | Freq: Four times a day (QID) | ORAL | 0 refills | Status: DC | PRN

## 2023-09-02 NOTE — Progress Notes (Signed)
Subjective:    Patient ID: Joann Page, female    DOB: December 09, 1960, 62 y.o.   MRN: [id number]  HPI: Joann Page is a 62 y.o. female who returns for follow up appointment for chronic pain and medication refill. She states her pain is located in her lower back radiating into her right lower extremity, she reports no relief of her pain with the Tramadol. She rates her pain 8. Her current exercise regime is walking and performing stretching exercises.  [name] Morphine equivalent is 40.00 MME.   Last UDS was Performed on 07/28/2023, it was consistent.    Pain Inventory Average Pain 8 Pain Right Now 8 My pain is tingling and aching  In the last 24 hours, has pain interfered with the following? General activity 7 Relation with others 7 Enjoyment of life 7 What TIME of day is your pain at its worst? morning , evening, and night Sleep (in general) Poor  Pain is worse with: walking, bending, sitting, and standing Pain improves with: TENS and injections Relief from Meds: 0  Family History  Problem Relation Age of Onset   Colon cancer Father 82   Alcohol abuse Father    Colon polyps Brother    Anxiety disorder Brother    Depression Brother    Breast cancer Sister 61   Obesity Sister    Anxiety disorder Sister    Depression Sister    Anxiety disorder Mother    Depression Mother    Bipolar disorder Mother    Lymphoma Brother    Drug abuse Brother    Anxiety disorder Sister    Depression Sister    Bipolar disorder Sister    Esophageal cancer Neg Hx    Stomach cancer Neg Hx    Rectal cancer Neg Hx    Social History   Socioeconomic History   Marital status: Divorced    Spouse name: Not on file   Number of children: 1   Years of education: Not on file   Highest education level: Associate degree: occupational, Scientist, product/process development, or vocational program  Occupational History   Occupation: Chief Financial Officer    CommentHydrologist  Tobacco Use   Smoking status: Never   Smokeless tobacco:  Never  Vaping Use   Vaping status: Never Used  Substance and Sexual Activity   Alcohol use: No   Drug use: Yes    Comment: have drinks monthly   Sexual activity: Yes  Other Topics Concern   Not on file  Social History Narrative   Married for 20+ years, divorced Dec 2017   Living on own      Social Determinants of Health   Financial Resource Strain: High Risk (05/27/2018)   Overall Financial Resource Strain (CARDIA)    Difficulty of Paying Living Expenses: Hard  Food Insecurity: Food Insecurity Present (05/27/2018)   Hunger Vital Sign    Worried About Running Out of Food in the Last Year: Often true    Ran Out of Food in the Last Year: Often true  Transportation Needs: No Transportation Needs (05/27/2018)   PRAPARE - Administrator, Civil Service (Medical): No    Lack of Transportation (Non-Medical): No  Physical Activity: Inactive (05/27/2018)   Exercise Vital Sign    Days of Exercise per Week: 0 days    Minutes of Exercise per Session: 0 min  Stress: Stress Concern Present (05/27/2018)   [employer] of Occupational Health - Occupational Stress Questionnaire    Feeling of Stress :  Rather much  Social Connections: Socially Isolated (05/27/2018)   Social Connection and Isolation Panel [NHANES]    Frequency of Communication with Friends and Family: Never    Frequency of Social Gatherings with Friends and Family: Never    Attends Religious Services: Never    Database administrator or Organizations: No    Attends Engineer, structural: Never    Marital Status: Separated   Past Surgical History:  Procedure Laterality Date   ABDOMINAL HYSTERECTOMY  04/2000   partial, fibroids   BREAST LUMPECTOMY  07/17/2012   Left Breast: Invasive ductal Cracinoma, 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   BREATH TEK H PYLORI  12/07/2011   Procedure: BREATH TEK H PYLORI;  Surgeon: Kandis Cocking, MD;  Location: [facility];  Service: Endoscopy;  Laterality: N/A;   COLONOSCOPY  08/25/2016   Dr.Nandigam   Needle Core Biopsy  06/30/2012   Left Breast: 12 O'clock/ Benign Parenchyma   Past Medical History:  Diagnosis Date   Anxiety    Arthritis 2010   lumbar spine   Breast cancer (HCC) 07/17/12   at age 62, ER/PR +, hER 2 -   Cancer (HCC) 07/07/12   Left Breast Inv Ductal Ca.   Degenerative disk disease 2010   Lumbar spine   Depression    GERD (gastroesophageal reflux disease)    hx   H/O hiatal hernia    small   Hyperlipidemia    Knee pain, right    No pertinent past medical history    Obesity    S/P radiation therapy 11/30/12 - 01/12/13   Left Breast / 50 gray in 25 Fractions with boost 10 gray in 5 Fractions   Status post chemotherapy comp 10/27/12    4 CyclesTaxotere/ Cytoxan   Status post chemotherapy Comp. 01/12/13   neoadjuvant chemotherapy: 4 Cycles of Taxotere/Cytoxan   Use of tamoxifen (Nolvadex) 01/18/13   There were no vitals taken for this visit.  Opioid Risk Score:   Fall Risk Score:  `1  Depression screen [facility] 2/9     07/28/2023   10:45 AM 04/05/2023   11:07 AM 03/31/2022    8:59 AM 03/17/2021    3:51 PM  06/05/2020    2:45 PM 06/04/2019    3:49 PM 04/22/2017   10:50 AM  Depression screen PHQ 2/9  Decreased Interest 1 0 0 0 0 1 1  Down, Depressed, Hopeless 0 0 0 0 0 2 0  PHQ - 2 Score 1 0 0 0 0 3 1  Altered sleeping 0 0 0 0 0 1 0  Tired, decreased energy 2 0 0  0 0 0 1  Change in appetite 0 0 0 0 0 0 3  Feeling bad or failure about yourself  0 0 0 0 0 1 1  Trouble concentrating 0 0 0 0 0 2 0  Moving slowly or fidgety/restless 0 0 0 0 0 0 0  Suicidal thoughts 0 0 0 0 0 0 0  PHQ-9 Score 3 0 0 0 0 7 6  Difficult doing work/chores  Not difficult at all Not difficult at all          Review of Systems  Musculoskeletal:  Positive for back pain.  All other systems reviewed and are negative.     Objective:   Physical Exam Vitals and nursing note reviewed.  Constitutional:      Appearance: Normal appearance.  Cardiovascular:     Rate and Rhythm: Normal rate and regular rhythm.     Pulses: Normal pulses.     Heart sounds: Normal heart sounds.  Pulmonary:     Effort: Pulmonary effort is normal.     Breath sounds: Normal breath sounds.  Musculoskeletal:     Cervical back: Normal range of motion and neck supple.     Comments: Normal Muscle Bulk and Muscle Testing Reveals:  Upper Extremities: Full ROM and Muscle Strength 5/5 Lumbar Paraspinal Tenderness: L-3-L-5 Lower Extremities: Full ROM and Muscle Strength 5/5 Arises from Chair with ease Narrow Based  Gait     Skin:    General: Skin is warm and dry.  Neurological:     Mental Status: She is alert and oriented to person, place, and time.  Psychiatric:        Mood and Affect: Mood normal.        Behavior: Behavior normal.         Assessment & Plan:  Lumbar Spinal Stenosis/ Right Lumbar Radiculitis: Continue HEP as Tolerated. Ms. Muccio states she is not in physical therapy at this time, she is in the process of looking into another agency she reports. Will speak with Dr Wynn Banker regarding Endoscopic decompression of Lumbar spine  for spinal stenosis, she verbalizes understanding. Referral placed to Dr Myer Haff this was discussed with Dr Wynn Banker.  She reports Tramadol ineffective, no relief noted. She will bring her Tramadol to office to be destroyed per office policy. We will resume her Hydrocodone, UDS was Reviewed. She verbalizes understanding.  2. Chronic Pian Syndrome: RX: Hydrocodone 10/325 mg one tablet 4 times a day as needed for pain #120. We will continue the opioid monitoring program, this consists of regular clinic visits, examinations, urine drug screen, pill counts as well as use of [place] Controlled Substance Reporting system. A 12 month History has been reviewed on the [place] Controlled Substance Reporting System on 09/02/2023.   F/U in 1 month

## 2023-09-21 ENCOUNTER — Ambulatory Visit: Payer: 59 | Admitting: Orthopedic Surgery

## 2023-09-25 NOTE — Progress Notes (Unsigned)
Referring Physician:  Myrlene Broker, MD [address] [place],  [place] [address]  Primary Physician:  Joann Broker, MD  History of Present Illness: 09/28/2023 [name] has a history of HTN, GERD, breast CA, hyperlipidemia, and obesity.   She has been seeing [facility] (Joann Page) and she is taking norco 10. Ultram did not help.   She has constant LBP that radiates into the right posterior leg pain to her knee x years. No left leg pain. LBP = right leg pain. Pain is worse with activity, especially bending. No pain with walking, but hurts a lot once she stops.  No alleviating factors. She noted tingling in both feet. No numbness. No leg weakness.   No relief with previous lumbar injections.   She is interested in an endoscopic decompression surgery. She does not want a fusion.   Bowel/Bladder Dysfunction: none  Conservative measures:  Physical therapy: did PT about 4-5 years ago with no relief Multimodal medical therapy including regular antiinflammatories: ultram, norco, flexeril, motrin, lidoderm Injections:  has received epidural steroid injections with no relief  Past Surgery: no previous spinal surgeries   Joann Page has no symptoms of cervical myelopathy.  The symptoms are causing a significant impact on the patient's life.   Review of Systems:  A 10 point review of systems is negative, except for the pertinent positives and negatives detailed in the HPI.  Past Medical History: Past Medical History:  Diagnosis Date   Anxiety    Arthritis 2010   lumbar spine   Breast cancer (HCC) 07/17/12   at age 70, ER/PR +, hER 2 -   Cancer (HCC) 07/07/12   Left Breast Inv Ductal Ca.   Degenerative disk disease 2010   Lumbar spine   Depression    GERD (gastroesophageal reflux disease)    hx   H/O hiatal hernia    small   Hyperlipidemia    Knee pain, right    No pertinent past medical history    Obesity    S/P radiation therapy 11/30/12 - 01/12/13    Left Breast / 50 gray in 25 Fractions with boost 10 gray in 5 Fractions   Status post chemotherapy comp 10/27/12    4 CyclesTaxotere/ Cytoxan   Status post chemotherapy Comp. 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   BREATH TEK H PYLORI  12/07/2011   Procedure: BREATH TEK H PYLORI;  Surgeon: Kandis Cocking, MD;  Location: [facility];  Service: Endoscopy;  Laterality: N/A;   COLONOSCOPY  08/25/2016   Joann Page   Needle Core Biopsy  06/30/2012   Left Breast: 12 O'clock/ Benign Parenchyma    Allergies: Allergies as of 09/28/2023 - Review Complete 09/28/2023  Allergen Reaction Noted   Sulfa antibiotics Shortness Of Breath and Swelling 04/29/2011    Medications: Outpatient Encounter Medications as of 09/28/2023  Medication Sig   cyanocobalamin (VITAMIN B12) 1000 MCG tablet Take 1 tablet (1,000 mcg total) by mouth daily.   cyclobenzaprine (FLEXERIL) 10 MG tablet Take 1 tablet  by mouth every 8 to 12 hours   hydrochlorothiazide (HYDRODIURIL) 25 MG tablet Take 1 tablet (25 mg total) by mouth daily.   HYDROcodone-acetaminophen (NORCO) 10-325 MG tablet Take 1 tablet by mouth 4 (four) times daily as needed.   ibuprofen (ADVIL) 800 MG tablet Take 1 tablet by mouth 3 times a day with food   lidocaine (LIDODERM) 5 % Apply 1 patch to skin once a day as needed for pain-- remove after 12 hours. Ok to replace with new patch 12 hours later.   naloxone (NARCAN) nasal spray 4 mg/0.1 mL Use 1 spray every 3 minutes; spray 1 dose  into ONE nostril; alternate nostrils with each dose until help arrives   pantoprazole (PROTONIX) 40 MG tablet Take 1 tablet (40 mg total) by mouth daily.   pravastatin (PRAVACHOL) 40 MG tablet Take 1 tablet (40 mg total) by mouth daily.   No facility-administered encounter medications on file as of 09/28/2023.    Social History: Social History   Tobacco Use   Smoking status: Never   Smokeless tobacco: Never  Vaping Use   Vaping status: Never Used  Substance Use Topics   Alcohol use: No   Drug use: Yes    Comment: have drinks monthly    Family Medical History: Family History  Problem Relation Age of Onset   Colon cancer Father 67   Alcohol abuse Father    Colon polyps Brother    Anxiety disorder Brother    Depression Brother    Breast cancer Sister 60   Obesity Sister    Anxiety disorder Sister    Depression Sister    Anxiety disorder Mother    Depression Mother    Bipolar disorder Mother    Lymphoma Brother    Drug abuse Brother    Anxiety disorder Sister    Depression Sister    Bipolar disorder Sister    Esophageal cancer Neg Hx    Stomach cancer Neg Hx    Rectal cancer Neg Hx     Physical Examination: Vitals:   09/28/23 1427  BP: 128/78    General: Patient is well developed, well nourished, calm, collected, and in no apparent distress. Attention to examination is appropriate.  Respiratory: Patient is breathing without any difficulty.   NEUROLOGICAL:     Awake, alert, oriented to person, place, and time.  Speech is clear and fluent. Fund of knowledge is appropriate.   Cranial Nerves: Pupils equal round and reactive to light.  Facial tone is symmetric.    No posterior lumbar tenderness.   No abnormal lesions on exposed skin.   Strength: Side Biceps Triceps Deltoid Interossei Grip Wrist Ext. Wrist Flex.  R 5 5 5 5 5 5 5   L 5 5 5 5 5 5 5    Side Iliopsoas Quads Hamstring PF DF EHL  R 5 5 5 5 5 5   L 5 5 5 5 5 5    Reflexes are 2+ and symmetric at  the biceps, brachioradialis, patella and achilles.   Hoffman's is absent.  Clonus is not present.   Bilateral upper and lower extremity sensation is intact to light touch.     Gait is normal.    Medical Decision Making  Imaging: Lumbar MRI dated 07/14/18:  FINDINGS: Segmentation:  Normal   Alignment: Mild retrolisthesis L3-4 unchanged. 7 mm anterolisthesis L4-5 unchanged.   Vertebrae:  Negative for fracture or mass.   Conus medullaris and cauda equina: Conus extends to the L1-2 level. Conus and cauda  equina appear normal.   Paraspinal and other soft tissues: Negative for paraspinous mass or fluid collection   Disc levels:   T12-L1: Negative   L1-2: Negative   L2-3: Mild disc and facet degeneration without stenosis   L3-4: Slight retrolisthesis. Mild disc and facet degeneration without stenosis   L4-5: 7 mm anterolisthesis. Advanced facet and disc degeneration. Mild to moderate spinal stenosis has improved in the interval. Severe subarticular stenosis on the left is unchanged. Moderate subarticular stenosis on the right is unchanged   L5-S1: Mild disc and facet degeneration. Negative for disc protrusion or stenosis.   IMPRESSION: Advanced disc and facet degeneration at L4-5 with 7 mm anterolisthesis. Mild to moderate spinal stenosis shows improvement. Severe subarticular stenosis on the left is unchanged and moderate subarticular stenosis on the right is unchanged   Mild degenerative changes elsewhere in the lumbar spine as described above.     Electronically Signed   By: Marlan Palau M.D.   On: 07/15/2018 07:31  I have personally reviewed the images and agree with the above interpretation.  Assessment and Plan: Joann Page is a pleasant 62 y.o. female with constant LBP that radiates into the right posterior leg pain to her knee x years. No left leg pain. LBP = right leg pain. She noted tingling in both feet. No numbness. No leg weakness.   MRI from 2019  shows slip at L4-L5 with DDD, mild central stenosis, and bilateral subarticular stenosis.   She is interested in an endoscopic decompression surgery. She does not want a fusion.   Treatment options discussed with patient and following plan made:   - MRI of lumbar spine to further evaluate lumbar radiculopathy. No relief with time, medications, or injections.  - Lumbar xrays with flexion/extension to evaluate slip at L4-L5.  - No relief with previous PT or injections- she does not want to revisit them.  - Discussed that we do MIS surgery but not endoscopic surgery. Also discussed that a fusion surgery may be recommended.  - She would also need to revisit PT prior to any surgical discussion- can review at her follow up.  - Will schedule phone visit to review MRI results once I get them back. Will likely review with Joann Page as well.  - If she still is only interested in endoscopic surgery, can consider refer to Joann Page.   I spent a total of 25 minutes in face-to-face and non-face-to-face activities related to this patient's care today including review of outside records, review of imaging, review of symptoms, physical exam, discussion of differential diagnosis, discussion of treatment options, and documentation.   Thank you for involving me in the care of this patient.   Joann Leach PA-C Dept. of Neurosurgery

## 2023-09-28 ENCOUNTER — Encounter: Payer: Self-pay | Admitting: Orthopedic Surgery

## 2023-09-28 ENCOUNTER — Ambulatory Visit (INDEPENDENT_AMBULATORY_CARE_PROVIDER_SITE_OTHER): Payer: 59 | Admitting: Orthopedic Surgery

## 2023-09-28 VITALS — BP 128/78 | Ht 66.0 in | Wt 165.0 lb

## 2023-09-28 DIAGNOSIS — M4316 Spondylolisthesis, lumbar region: Secondary | ICD-10-CM

## 2023-09-28 DIAGNOSIS — M545 Low back pain, unspecified: Secondary | ICD-10-CM | POA: Diagnosis not present

## 2023-09-28 DIAGNOSIS — M47816 Spondylosis without myelopathy or radiculopathy, lumbar region: Secondary | ICD-10-CM

## 2023-09-28 DIAGNOSIS — M5416 Radiculopathy, lumbar region: Secondary | ICD-10-CM | POA: Diagnosis not present

## 2023-09-28 NOTE — Patient Instructions (Signed)
It was so nice to see you today. Thank you so much for coming in.    You have a slip at L4-L5 with spinal stenosis and wear and tear (arthritis).   I want to get an MRI of your lower back to look into things further. We will get this approved through your insurance and [facility] will call you to schedule the appointment. Make sure you are scheduled for the WIDE BORE MRI.   When you get the MRI done, I also want to get some xrays of your lower back. Remind them to do these please. You do not need an appointment for the xrays.   After you have the MRI and xrays, it takes 10-14 days for me to get the results back. Once I have them, we will call you to schedule a follow up phone visit with me to review them.   Please do not hesitate to call if you have any questions or concerns. You can also message me in MyChart.   Drake Leach PA-C [phone]     The physicians and staff at [facility] at [facility] are committed to providing excellent care. You may receive a survey asking for feedback about your experience at our office. We value you your feedback and appreciate you taking the time to to fill it out. The [facility] leadership team is also available to discuss your experience in person, feel free to contact us [phone].

## 2023-09-30 ENCOUNTER — Encounter: Payer: Self-pay | Admitting: Registered Nurse

## 2023-09-30 ENCOUNTER — Encounter: Payer: 59 | Attending: Physical Medicine & Rehabilitation | Admitting: Registered Nurse

## 2023-09-30 VITALS — BP 129/84 | HR 74 | Ht 66.0 in | Wt 167.0 lb

## 2023-09-30 DIAGNOSIS — Z5181 Encounter for therapeutic drug level monitoring: Secondary | ICD-10-CM | POA: Diagnosis not present

## 2023-09-30 DIAGNOSIS — G894 Chronic pain syndrome: Secondary | ICD-10-CM

## 2023-09-30 DIAGNOSIS — Z79891 Long term (current) use of opiate analgesic: Secondary | ICD-10-CM | POA: Diagnosis not present

## 2023-09-30 DIAGNOSIS — Z419 Encounter for procedure for purposes other than remedying health state, unspecified: Secondary | ICD-10-CM | POA: Diagnosis not present

## 2023-09-30 DIAGNOSIS — M5416 Radiculopathy, lumbar region: Secondary | ICD-10-CM

## 2023-09-30 MED ORDER — HYDROCODONE-ACETAMINOPHEN 10-325 MG PO TABS: 1.0000 | ORAL_TABLET | Freq: Four times a day (QID) | ORAL | 0 refills | Status: DC | PRN

## 2023-10-30 DIAGNOSIS — Z419 Encounter for procedure for purposes other than remedying health state, unspecified: Secondary | ICD-10-CM | POA: Diagnosis not present

## 2023-10-31 ENCOUNTER — Encounter: Payer: Self-pay | Admitting: Registered Nurse

## 2023-10-31 ENCOUNTER — Encounter: Payer: 59 | Attending: Physical Medicine & Rehabilitation | Admitting: Registered Nurse

## 2023-10-31 VITALS — BP 127/90 | HR 72 | Ht 66.0 in | Wt 167.0 lb

## 2023-10-31 DIAGNOSIS — Z79891 Long term (current) use of opiate analgesic: Secondary | ICD-10-CM | POA: Insufficient documentation

## 2023-10-31 DIAGNOSIS — Z5181 Encounter for therapeutic drug level monitoring: Secondary | ICD-10-CM | POA: Insufficient documentation

## 2023-10-31 DIAGNOSIS — M5416 Radiculopathy, lumbar region: Secondary | ICD-10-CM | POA: Diagnosis not present

## 2023-10-31 DIAGNOSIS — G894 Chronic pain syndrome: Secondary | ICD-10-CM | POA: Diagnosis not present

## 2023-10-31 MED ORDER — HYDROCODONE-ACETAMINOPHEN 10-325 MG PO TABS: 1.0000 | ORAL_TABLET | Freq: Four times a day (QID) | ORAL | 0 refills | Status: DC | PRN

## 2023-10-31 NOTE — Progress Notes (Unsigned)
Subjective:    Patient ID: Joann Page, female    DOB: 1961/11/05, 62 y.o.   MRN: [id number]  HPI: Joann Page is a 62 y.o. female who returns for follow up appointment for chronic pain and medication refill. states *** pain is located in  ***. rates pain ***. current exercise regime is walking and performing stretching exercises.  Joann Page Morphine equivalent is *** MME.   UDS ordered today.      Pain Inventory Average Pain 10 Pain Right Now 4 My pain is constant, dull, and aching  In the last 24 hours, has pain interfered with the following? General activity 5 Relation with others 7 Enjoyment of life 7 What TIME of day is your pain at its worst? morning  and night Sleep (in general) Fair  Pain is worse with: bending and standing Pain improves with: rest, medication, and injections Relief from Meds: 8  Family History  Problem Relation Age of Onset   Colon cancer Father 31   Alcohol abuse Father    Colon polyps Brother    Anxiety disorder Brother    Depression Brother    Breast cancer Sister 38   Obesity Sister    Anxiety disorder Sister    Depression Sister    Anxiety disorder Mother    Depression Mother    Bipolar disorder Mother    Lymphoma Brother    Drug abuse Brother    Anxiety disorder Sister    Depression Sister    Bipolar disorder Sister    Esophageal cancer Neg Hx    Stomach cancer Neg Hx    Rectal cancer Neg Hx    Social History   Socioeconomic History   Marital status: Divorced    Spouse name: Not on file   Number of children: 1   Years of education: Not on file   Highest education level: Associate degree: occupational, Scientist, product/process development, or vocational program  Occupational History   Occupation: Chief Financial Officer    CommentHydrologist  Tobacco Use   Smoking status: Never   Smokeless tobacco: Never  Vaping Use   Vaping status: Never Used  Substance and Sexual Activity   Alcohol use: No   Drug use: Yes    Comment: have drinks monthly   Sexual  activity: Yes  Other Topics Concern   Not on file  Social History Narrative   Married for 20+ years, divorced Dec 2017   Living on own      Social Determinants of Health   Financial Resource Strain: High Risk (05/27/2018)   Overall Financial Resource Strain (CARDIA)    Difficulty of Paying Living Expenses: Hard  Food Insecurity: Food Insecurity Present (05/27/2018)   Hunger Vital Sign    Worried About Running Out of Food in the Last Year: Often true    Ran Out of Food in the Last Year: Often true  Transportation Needs: No Transportation Needs (05/27/2018)   PRAPARE - Administrator, Civil Service (Medical): No    Lack of Transportation (Non-Medical): No  Physical Activity: Inactive (05/27/2018)   Exercise Vital Sign    Days of Exercise per Week: 0 days    Minutes of Exercise per Session: 0 min  Stress: Stress Concern Present (05/27/2018)   [employer] of Occupational Health - Occupational Stress Questionnaire    Feeling of Stress : Rather much  Social Connections: Socially Isolated (05/27/2018)   Social Connection and Isolation Panel [NHANES]    Frequency of Communication with Friends and  Family: Never    Frequency of Social Gatherings with Friends and Family: Never    Attends Religious Services: Never    Database administrator or Organizations: No    Attends Engineer, structural: Never    Marital Status: Separated   Past Surgical History:  Procedure Laterality Date   ABDOMINAL HYSTERECTOMY  04/2000   partial, fibroids   BREAST LUMPECTOMY  07/17/2012   Left Breast: Invasive ductal Cracinoma, 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   BREATH TEK H PYLORI  12/07/2011   Procedure: BREATH TEK H PYLORI;  Surgeon: Kandis Cocking, MD;  Location: [facility];  Service: Endoscopy;  Laterality: N/A;   COLONOSCOPY  08/25/2016   Dr.Nandigam   Needle Core Biopsy  06/30/2012   Left Breast: 12 O'clock/ Benign Parenchyma   Past Medical History:  Diagnosis Date   Anxiety    Arthritis 2010   lumbar spine   Breast cancer (HCC) 07/17/12   at age 23, ER/PR +, hER 2 -   Cancer (HCC) 07/07/12   Left Breast Inv Ductal Ca.   Degenerative disk disease 2010   Lumbar spine   Depression    GERD (gastroesophageal reflux disease)    hx   H/O hiatal hernia    small   Hyperlipidemia    Knee pain, right    No pertinent past medical history    Obesity    S/P radiation therapy 11/30/12 - 01/12/13   Left Breast / 50 gray in 25 Fractions with boost 10 gray in 5 Fractions   Status post chemotherapy comp 10/27/12    4 CyclesTaxotere/ Cytoxan   Status post chemotherapy Comp. 01/12/13   neoadjuvant chemotherapy: 4 Cycles of Taxotere/Cytoxan   Use of tamoxifen (Nolvadex) 01/18/13   There were no vitals taken for this visit.  Opioid Risk Score:   Fall Risk Score:  `1  Depression screen [facility] 2/9     09/30/2023    2:06 PM 09/02/2023   10:33 AM 07/28/2023   10:45 AM 04/05/2023   11:07 AM 03/31/2022    8:59 AM 03/17/2021    3:51 PM 06/05/2020    2:45 PM  Depression screen PHQ 2/9  Decreased Interest 0 0 1 0 0 0 0  Down, Depressed, Hopeless 0 0  0 0 0 0 0  PHQ - 2 Score 0 0 1 0 0 0 0  Altered sleeping   0 0 0 0 0  Tired, decreased energy   2 0 0 0 0  Change in appetite   0 0 0 0 0  Feeling bad or failure about yourself  0 0 0 0 0  Trouble concentrating   0 0 0 0 0  Moving slowly or fidgety/restless   0 0 0 0 0  Suicidal thoughts   0 0 0 0 0  PHQ-9 Score   3 0 0 0 0  Difficult doing work/chores    Not difficult at all Not difficult at all      Review of Systems  Musculoskeletal:  Positive for back pain.  All other systems reviewed and are negative.      Objective:   Physical Exam        Assessment & Plan:  Lumbar Spinal Stenosis/ Right Lumbar Radiculitis: Continue HEP as Tolerated.  She reports she seen Dr Myer Haff, she is scheduled for MRI.09/30/2023  Chronic Pian Syndrome: Refilled: Hydrocodone 10/325 mg one tablet 4 times a day as needed for pain #120. We will continue the opioid monitoring program, this consists of regular clinic visits, examinations, urine drug screen, pill counts as well as use of [place] Controlled Substance Reporting system. A 12 month History has been reviewed on the [place] Controlled Substance Reporting System on 09/30/2023.    F/U in 1 month

## 2023-11-02 ENCOUNTER — Other Ambulatory Visit: Payer: 59

## 2023-11-05 LAB — TOXASSURE SELECT,+ANTIDEPR,UR

## 2023-11-09 ENCOUNTER — Telehealth: Payer: Self-pay | Admitting: Adult Health

## 2023-11-09 NOTE — Telephone Encounter (Signed)
Patient aware of time change 

## 2023-11-30 DIAGNOSIS — Z419 Encounter for procedure for purposes other than remedying health state, unspecified: Secondary | ICD-10-CM | POA: Diagnosis not present

## 2023-12-02 ENCOUNTER — Ambulatory Visit: Payer: 59 | Admitting: Registered Nurse

## 2023-12-02 ENCOUNTER — Other Ambulatory Visit: Payer: 59

## 2023-12-06 ENCOUNTER — Encounter: Payer: Self-pay | Admitting: Registered Nurse

## 2023-12-06 ENCOUNTER — Ambulatory Visit: Payer: Self-pay | Admitting: Internal Medicine

## 2023-12-06 ENCOUNTER — Encounter: Payer: 59 | Attending: Physical Medicine & Rehabilitation | Admitting: Registered Nurse

## 2023-12-06 VITALS — BP 156/101 | HR 78 | Ht 66.0 in | Wt 170.0 lb

## 2023-12-06 DIAGNOSIS — M48061 Spinal stenosis, lumbar region without neurogenic claudication: Secondary | ICD-10-CM | POA: Insufficient documentation

## 2023-12-06 DIAGNOSIS — Z5181 Encounter for therapeutic drug level monitoring: Secondary | ICD-10-CM | POA: Diagnosis present

## 2023-12-06 DIAGNOSIS — G894 Chronic pain syndrome: Secondary | ICD-10-CM | POA: Diagnosis present

## 2023-12-06 DIAGNOSIS — I10 Essential (primary) hypertension: Secondary | ICD-10-CM | POA: Diagnosis present

## 2023-12-06 DIAGNOSIS — Z79891 Long term (current) use of opiate analgesic: Secondary | ICD-10-CM | POA: Insufficient documentation

## 2023-12-06 MED ORDER — HYDROCODONE-ACETAMINOPHEN 10-325 MG PO TABS: 1.0000 | ORAL_TABLET | Freq: Four times a day (QID) | ORAL | 0 refills | Status: DC | PRN

## 2023-12-06 NOTE — Patient Instructions (Signed)
 Call your Primary Care Physician Regarding your Blood Pressure.  Keep a Blood Pressure Log: send reading to your Primary Care Physician.

## 2023-12-06 NOTE — Progress Notes (Signed)
 Subjective:    Patient ID: Joann Page, female    DOB: 1961-07-14, 63 y.o.   MRN: [id number]  HPI: Joann Page is a 63 y.o. female who returns for follow up appointment for chronic pain and medication refill. She states her pain is located in her lower back. She rates her pain 3. Her current exercise regime is walking and performing stretching exercises.  [name] arrived to office with uncontrolled hypertension, she states she didn't take her anti-hypertensive medication today. Blood Pressure was re-checked.  She refuses ED evaluation, denies being symptomatic , she states she will call her PCP to scheduled a F/U appointment.  Blood Pressure was re-checked.   [name] Morphine  equivalent is 40.00 MME.   Last UDS was Performed on 10/31/2023, it was consistent.    Pain Inventory Average Pain 3 Pain Right Now 3 My pain is sharp, burning, dull, stabbing, tingling, and aching  In the last 24 hours, has pain interfered with the following? General activity 0 Relation with others 0 Enjoyment of life 0 What TIME of day is your pain at its worst? morning  and night Sleep (in general) Fair  Pain is worse with: walking, bending, sitting, standing, and some activites Pain improves with: rest and medication Relief from Meds: 7  Family History  Problem Relation Age of Onset   Colon cancer Father 44   Alcohol abuse Father    Colon polyps Brother    Anxiety disorder Brother    Depression Brother    Breast cancer Sister 83   Obesity Sister    Anxiety disorder Sister    Depression Sister    Anxiety disorder Mother    Depression Mother    Bipolar disorder Mother    Lymphoma Brother    Drug abuse Brother    Anxiety disorder Sister    Depression Sister    Bipolar disorder Sister    Esophageal cancer Neg Hx    Stomach cancer Neg Hx    Rectal cancer Neg Hx    Social History   Socioeconomic History   Marital status: Divorced    Spouse name: Not on file   Number of children: 1    Years of education: Not on file   Highest education level: Associate degree: occupational, scientist, product/process development, or vocational program  Occupational History   Occupation: chief financial officer    Commenthydrologist  Tobacco Use   Smoking status: Never   Smokeless tobacco: Never  Vaping Use   Vaping status: Never Used  Substance and Sexual Activity   Alcohol use: No   Drug use: Yes    Comment: have drinks monthly   Sexual activity: Yes  Other Topics Concern   Not on file  Social History Narrative   Married for 20+ years, divorced Dec 2017   Living on own      Social Drivers of Health   Financial Resource Strain: High Risk (05/27/2018)   Overall Financial Resource Strain (CARDIA)    Difficulty of Paying Living Expenses: Hard  Food Insecurity: Food Insecurity Present (05/27/2018)   Hunger Vital Sign    Worried About Running Out of Food in the Last Year: Often true    Ran Out of Food in the Last Year: Often true  Transportation Needs: No Transportation Needs (05/27/2018)   PRAPARE - Administrator, Civil Service (Medical): No    Lack of Transportation (Non-Medical): No  Physical Activity: Inactive (05/27/2018)   Exercise Vital Sign    Days of  Exercise per Week: 0 days    Minutes of Exercise per Session: 0 min  Stress: Stress Concern Present (05/27/2018)   [employer] of Occupational Health - Occupational Stress Questionnaire    Feeling of Stress : Rather much  Social Connections: Socially Isolated (05/27/2018)   Social Connection and Isolation Panel [NHANES]    Frequency of Communication with Friends and Family: Never    Frequency of Social Gatherings with Friends and Family: Never    Attends Religious Services: Never    Database Administrator or Organizations: No    Attends Engineer, Structural: Never    Marital Status: Separated   Past Surgical History:  Procedure Laterality Date   ABDOMINAL HYSTERECTOMY  04/2000   partial, fibroids   BREAST LUMPECTOMY  07/17/2012    Left Breast: Invasive ductal Cracinoma, 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   BREATH TEK H PYLORI  12/07/2011   Procedure: BREATH TEK H PYLORI;  Surgeon: Alm VEAR Angle, MD;  Location: [facility];  Service: Endoscopy;  Laterality: N/A;   COLONOSCOPY  08/25/2016   Dr.Nandigam   Needle Core Biopsy  06/30/2012   Left Breast: 12 O'clock/ Benign Parenchyma   Past Medical History:  Diagnosis Date   Anxiety    Arthritis 2010   lumbar spine   Breast cancer (HCC) 07/17/12   at age 44, ER/PR +, hER 2 -   Cancer (HCC) 07/07/12   Left Breast Inv Ductal Ca.   Degenerative disk disease 2010   Lumbar spine   Depression    GERD (gastroesophageal reflux disease)    hx   H/O hiatal hernia    small   Hyperlipidemia    Knee pain, right    No pertinent past medical history    Obesity    S/P radiation therapy 11/30/12 - 01/12/13   Left Breast / 50 gray in 25 Fractions with boost 10 gray in 5 Fractions   Status post chemotherapy comp 10/27/12    4 CyclesTaxotere/ Cytoxan    Status post chemotherapy Comp. 01/12/13   neoadjuvant chemotherapy: 4 Cycles of Taxotere /Cytoxan     Use of tamoxifen  (Nolvadex ) 01/18/13   Ht 5' 6 (1.676 m)   Wt 170 lb (77.1 kg)   BMI 27.44 kg/m   Opioid Risk Score:   Fall Risk Score:  `1  Depression screen PHQ 2/9     12/06/2023    1:47 PM 10/31/2023   12:54 PM 09/30/2023    2:06 PM 09/02/2023   10:33 AM 07/28/2023   10:45 AM 04/05/2023   11:07 AM 03/31/2022    8:59 AM  Depression screen PHQ 2/9  Decreased  Interest 0 0 0 0 1 0 0  Down, Depressed, Hopeless 0 0 0 0 0 0 0  PHQ - 2 Score 0 0 0 0 1 0 0  Altered sleeping     0 0 0  Tired, decreased energy     2 0 0  Change in appetite     0 0 0  Feeling bad or failure about yourself      0 0 0  Trouble concentrating     0 0 0  Moving slowly or fidgety/restless     0 0 0  Suicidal thoughts     0 0 0  PHQ-9 Score     3 0 0  Difficult doing work/chores      Not difficult at all Not difficult at all      Review of Systems  Musculoskeletal:  Positive for back pain and gait problem.  All other systems reviewed and are negative.     Objective:   Physical Exam Vitals and nursing note reviewed.  Constitutional:      Appearance: Normal appearance.  Cardiovascular:     Rate and Rhythm: Normal rate and regular rhythm.     Pulses: Normal pulses.     Heart sounds: Normal heart sounds.  Pulmonary:     Effort: Pulmonary effort is normal.     Breath sounds: Normal breath sounds.  Musculoskeletal:     Comments: Normal Muscle Bulk and Muscle Testing Reveals:  Upper Extremities: Full ROM and Muscle Strength 5/5  Lumbar Paraspinal Tenderness: L-4-L-5 Lower Extremities: Full ROM and Muscle Strength 5/5 Arises from Chair with ease Narrow Based  Gait     Skin:    General: Skin is warm and dry.  Neurological:     Mental Status: She is alert and oriented to person, place, and time.  Psychiatric:        Mood and Affect: Mood normal.        Behavior: Behavior normal.         Assessment & Plan:  Lumbar Spinal Stenosis/ Right Lumbar Radiculitis: Continue HEP as Tolerated.  She  reports she seen Dr Clois,  Chronic Pian Syndrome: Refilled: Hydrocodone  10/325 mg one tablet 4 times a day as needed for pain #120. We will continue the opioid monitoring program, this consists of regular clinic visits, examinations, urine drug screen, pill counts as well as use of [place]  Controlled Substance Reporting system. A 12 month History has been reviewed on the [place]  Controlled Substance Reporting System on 12/06/2023.   3. Uncontrolled Hypertension: Blood Pressure was re-checked , she refuses ED or Urgent Care Evaluation.  Ms. Mahaffy states she didn't take her anti-hypertensive medication. Educated on medication compliance, she verbalizes understanding. She was instructed to keep a blood Pressure Log and send readings to her PCP, she verbalizes understanding.   F/U in 1 month

## 2023-12-06 NOTE — Telephone Encounter (Signed)
 Copied from CRM [id number]. Topic: Clinical - Red Word Triage >> Dec 06, 2023  2:56 PM Evie B wrote: [facility] that prompted transfer to Nurse Triage: Elevated Blood Pressure  Chief Complaint: Elevated BP Symptoms: Elevated BP Frequency: Today Pertinent Negatives: Patient denies additional symptoms Disposition: [] ED /[] Urgent Care (no appt availability in office) / [x] Appointment(In office/virtual)/ []  [facility]/ [] Home Care/ [] Refused Recommended Disposition /[] [facility]/ []  Follow-up with PCP Additional Notes: Patient called in complaining of an elevated BP. Patient stated her BP was taken at a pain management appointment today. The most recent BP was 156/101. Patient stated that she was advised to make a follow-up appointment with her PCP regarding elevated BP. Patient denies blurred vision, chest pain, difficulty breathing, HA, and weakness. Patient stated that she has dealt with slightly elevated BP before and is currently taking hydrochlorothiazide  to manage it. Advised patient to been seen at PCP office within 3 days. Patient complied. Scheduled patient for tomorrow, 1/8 with Dr. Elnor. Advised patient to call back for worsening symptoms. Patient complied.   Reason for Disposition  Systolic BP  >= 160 OR Diastolic >= 100  Answer Assessment - Initial Assessment Questions 1. BLOOD PRESSURE: What is the blood pressure? Did you take at least two measurements 5 minutes apart?     Patient reports BP reading of 156/101 at pain management appointment today 2. ONSET: When did you take your blood pressure?    Patient states her BP was taken at a pain management appointment today 3. HOW: How did you take your blood pressure? (e.g., automatic home BP monitor, visiting nurse)     Patient states that she does not take BP at home 4. HISTORY: Do you have a history of high blood pressure?     Yes 5. MEDICINES: Are you taking any medicines for blood pressure? Have you  missed any doses recently?     Yes, patient states she takes hydrochlorothiazide  to manage BP 6. OTHER SYMPTOMS: Do you have any symptoms? (e.g., blurred vision, chest pain, difficulty breathing, headache, weakness)     Patient denies blurred vision, chest pain, difficulty breathing, HA, and weakness  Protocols used: Blood Pressure - High-A-AH

## 2023-12-07 ENCOUNTER — Ambulatory Visit: Payer: 59 | Admitting: Nurse Practitioner

## 2023-12-07 VITALS — BP 126/88 | HR 83 | Temp 98.3°F | Ht 66.0 in | Wt 171.0 lb

## 2023-12-07 DIAGNOSIS — I10 Essential (primary) hypertension: Secondary | ICD-10-CM

## 2023-12-07 NOTE — Assessment & Plan Note (Signed)
 Chronic Blood pressure did improve upon resting in the room for 5 minutes. Per shared decision making patient is going to continue on hydrochlorothiazide  25 mg daily, reduce excessive salt intake in diet and will get at home automatic blood pressure cuff.  She was educated on how to appropriately take blood pressure and to keep a diary of this.  She will follow back up in about 4 weeks with either myself or her PCP to discuss at home readings.  Pending this visit may consider adjusting medications at that time.

## 2023-12-07 NOTE — Progress Notes (Signed)
 Established Patient Office Visit  Subjective   Patient ID: Joann Page, female    DOB: [birth year]  Age: 63 y.o. MRN: [id number]  Chief Complaint  Patient presents with   Hypertension    Patient has today for acute visit.  She went to her pain management clinic yesterday and was told that her blood pressure was elevated.  Highest reading was 156/101.  She reports that she is asymptomatic but was encouraged to follow-up with PCP to discuss blood pressure management.  She does not routinely monitor blood pressure at home.  Per chart review recent blood pressure readings in this office are as follows: 06/2023: 118/88 03/2023: 122/82 03/2022: 122/80 She is currently on hydrochlorothiazide  25mg /day and is tolerating this well. She endorses frequent frozen dinners and popcorn intake which she feels may contributing to her readings.     Review of Systems  Eyes:  Negative for blurred vision.  Respiratory:  Negative for shortness of breath.   Cardiovascular:  Negative for chest pain.  Neurological:  Negative for dizziness and headaches.      Objective:     BP 126/88   Pulse 83   Temp 98.3 F (36.8 C) (Temporal)   Ht 5' 6 (1.676 m)   Wt 171 lb (77.6 kg)   BMI 27.60 kg/m  BP Readings from Last 3 Encounters:  12/07/23 126/88  12/06/23 (!) 156/101  10/31/23 (!) 127/90   Wt Readings from Last 3 Encounters:  12/07/23 171 lb (77.6 kg)  12/06/23 170 lb (77.1 kg)  10/31/23 167 lb (75.8 kg)      Physical Exam Vitals reviewed.  Constitutional:      General: She is not in acute distress.    Appearance: Normal appearance.  HENT:     Head: Normocephalic and atraumatic.  Neck:     Vascular: No carotid bruit.  Cardiovascular:     Rate and Rhythm: Normal rate and regular rhythm.     Pulses: Normal pulses.     Heart sounds: Normal heart sounds.  Pulmonary:     Effort: Pulmonary effort is normal.     Breath sounds: Normal breath sounds.  Skin:    General: Skin is warm and dry.   Neurological:     General: No focal deficit present.     Mental Status: She is alert and oriented to person, place, and time.  Psychiatric:        Mood and Affect: Mood normal.        Behavior: Behavior normal.        Judgment: Judgment normal.      No results found for any visits on 12/07/23.    The 10-year ASCVD risk score (Arnett DK, et al., 2019) is: 6.5%    Assessment & Plan:   Problem List Items Addressed This Visit       Cardiovascular and Mediastinum   Essential hypertension - Primary   Chronic Blood pressure did improve upon resting in the room for 5 minutes. Per shared decision making patient is going to continue on hydrochlorothiazide  25 mg daily, reduce excessive salt intake in diet and will get at home automatic blood pressure cuff.  She was educated on how to appropriately take blood pressure and to keep a diary of this.  She will follow back up in about 4 weeks with either myself or her PCP to discuss at home readings.  Pending this visit may consider adjusting medications at that time.       Return in  about 4 weeks (around 01/04/2024) for F/U with Alessio Bogan or Dr. Rollene whoever is available.    Lauraine FORBES Pereyra, NP

## 2023-12-14 ENCOUNTER — Ambulatory Visit
Admission: RE | Admit: 2023-12-14 | Discharge: 2023-12-14 | Disposition: A | Discharge status: Home / Self Care | Payer: 59 | Source: Ambulatory Visit | Attending: Orthopedic Surgery | Admitting: Orthopedic Surgery

## 2023-12-14 DIAGNOSIS — M5416 Radiculopathy, lumbar region: Secondary | ICD-10-CM

## 2023-12-14 DIAGNOSIS — M4316 Spondylolisthesis, lumbar region: Secondary | ICD-10-CM

## 2023-12-14 DIAGNOSIS — M47816 Spondylosis without myelopathy or radiculopathy, lumbar region: Secondary | ICD-10-CM

## 2023-12-20 ENCOUNTER — Other Ambulatory Visit: Payer: Self-pay | Admitting: Medical Genetics

## 2023-12-29 NOTE — Progress Notes (Signed)
Telephone Visit- Progress Note: Referring Physician:  No referring provider defined for this encounter.  Primary Physician:  Myrlene Broker, MD  This visit was performed via telephone.  Patient location: home Provider location: office  I spent a total of 10 minutes non-face-to-face activities for this visit on the date of this encounter including review of current clinical condition and response to treatment.    Patient has given verbal consent to this telephone visits and we reviewed the limitations of a telephone visit. Patient wishes to proceed.    Chief Complaint:  follow up  History of Present Illness: Joann Page is a 63 y.o. female has a history of HTN, GERD, breast CA, hyperlipidemia, and obesity.    She has been seeing [facility] ([facility]) and she is taking norco 10.   Last seen by me on 09/28/23 for back and right leg pain. MRI from 2019 showed slip at L4-L5 with DDD, mild central stenosis, and bilateral subarticular stenosis.    She was interested in an endoscopic decompression surgery. She did not want a fusion.   MRI and xrays of lumbar spine ordered. She did not have xrays done. Phone visit scheduled to review her lumbar MRI.   She is doing better than she was at her last visit! She now has only intermittent LBP. Her right posterior leg pain to her knee has improved and she's not had it in weeks. She gets regular massages and thinks these help. Pain is worse with activity, especially bending. She has occasional tingling in her feet, but nothing recently. No numbness or weakness.    No relief with previous lumbar injections or PT.   Bowel/Bladder Dysfunction: none   Conservative measures:  Physical therapy: did PT about 4-5 years ago with no relief Multimodal medical therapy including regular antiinflammatories: ultram, norco, flexeril, motrin, lidoderm Injections:  has received epidural steroid injections with no relief   Past Surgery: no previous  spinal surgeries    [name] has no symptoms of cervical myelopathy.   The symptoms are causing a significant impact on the patient's life.   Exam: No exam done as this was a telephone encounter.     Imaging: Lumbar MRI dated 12/14/23:  FINDINGS: Segmentation:  Standard.   Alignment:  Grade 1 anterolisthesis of L4 on L5.   Vertebrae: No fracture, evidence of discitis, or bone lesion. Degenerative endplate changes of the inferior and superior endplates of L4 on L5 where there is near-complete loss of disc space.   Conus medullaris and cauda equina: Conus extends to the L2 level. Conus and cauda equina appear normal.   Paraspinal and other soft tissues: Negative.   Disc levels:   T12-L1: Unremarkable   L1-L2: Unremarkable   L2-L3: Mild bilateral facet degenerative change. Mild right neural foraminal narrowing.   L3-L4: Mild bilateral facet degenerative change. No significant disc bulge. No spinal canal narrowing. Mild left neural foraminal narrowing.   L4-L5: Eccentric left disc bulge. Severe bilateral facet degenerative change. There is narrowing of the left-greater-than-right lateral recess. Mild overall spinal canal narrowing. Mild-to-moderate bilateral neural foraminal narrowing.   L5-S1: Moderate bilateral facet degenerative change. Minimal disc bulge. No spinal canal narrowing. No significant neural foraminal narrowing.   IMPRESSION: Lower lumbar spine predominant degenerative change, most notably at L4-L5 where there is severe degenerative disc and facet disease with associated grade 1 anterolisthesis. There is asymmetric osseous hypertrophy on the left that results in narrowing of the left greater than right lateral recess  with mild overall spinal canal narrowing. These findings are similar to slightly progressed compared to 07/14/18.     Electronically Signed   By: Lorenza Cambridge M.D.   On: 12/23/2023 08:14  I have personally reviewed the images  and agree with the above interpretation.  Assessment and Plan: [name] is doing better than she was at her last visit! She now has only intermittent LBP. Her right posterior leg pain to her knee has improved and she's not had it in weeks. She has occasional tingling in her feet, but nothing recently. No numbness or weakness.   She has known slip L4-L5 with severe DDD, mild central stenosis, mild/moderate bilateral foraminal stenosis, and severe facet hypertrophy.   She did not want to get lumbar xrays.    Treatment options discussed with patient and following plan made:   - MRI findings discussed with her at length.  - Discussed that surgical options would likely include a fusion, which she does not want. Also discussed she would need to do PT prior to any surgery. Would need lumbar xrays as well.  - She declines further PT or injections. Seeing [facility] and doing well (they are aware of her back issues).  - She would like to continue with current care on her own and will follow up with [place] prn.   Drake Leach PA-C Neurosurgery

## 2023-12-30 ENCOUNTER — Encounter: Payer: Self-pay | Admitting: Orthopedic Surgery

## 2023-12-30 ENCOUNTER — Ambulatory Visit (INDEPENDENT_AMBULATORY_CARE_PROVIDER_SITE_OTHER): Payer: 59 | Admitting: Orthopedic Surgery

## 2023-12-30 DIAGNOSIS — M4316 Spondylolisthesis, lumbar region: Secondary | ICD-10-CM

## 2023-12-30 DIAGNOSIS — M48061 Spinal stenosis, lumbar region without neurogenic claudication: Secondary | ICD-10-CM

## 2023-12-30 DIAGNOSIS — M4726 Other spondylosis with radiculopathy, lumbar region: Secondary | ICD-10-CM

## 2023-12-30 DIAGNOSIS — M5136 Other intervertebral disc degeneration, lumbar region with discogenic back pain only: Secondary | ICD-10-CM | POA: Diagnosis not present

## 2023-12-30 DIAGNOSIS — M47816 Spondylosis without myelopathy or radiculopathy, lumbar region: Secondary | ICD-10-CM

## 2023-12-30 DIAGNOSIS — M5416 Radiculopathy, lumbar region: Secondary | ICD-10-CM

## 2023-12-31 DIAGNOSIS — Z419 Encounter for procedure for purposes other than remedying health state, unspecified: Secondary | ICD-10-CM | POA: Diagnosis not present

## 2024-01-03 ENCOUNTER — Encounter: Payer: 59 | Attending: Physical Medicine & Rehabilitation | Admitting: Registered Nurse

## 2024-01-03 ENCOUNTER — Encounter: Payer: Self-pay | Admitting: Registered Nurse

## 2024-01-03 VITALS — BP 145/96 | HR 79 | Ht 66.0 in | Wt 170.0 lb

## 2024-01-03 DIAGNOSIS — G894 Chronic pain syndrome: Secondary | ICD-10-CM | POA: Diagnosis present

## 2024-01-03 DIAGNOSIS — M48061 Spinal stenosis, lumbar region without neurogenic claudication: Secondary | ICD-10-CM | POA: Insufficient documentation

## 2024-01-03 DIAGNOSIS — Z5181 Encounter for therapeutic drug level monitoring: Secondary | ICD-10-CM | POA: Insufficient documentation

## 2024-01-03 DIAGNOSIS — Z79891 Long term (current) use of opiate analgesic: Secondary | ICD-10-CM | POA: Insufficient documentation

## 2024-01-03 MED ORDER — HYDROCODONE-ACETAMINOPHEN 10-325 MG PO TABS: 1.0000 | ORAL_TABLET | Freq: Four times a day (QID) | ORAL | 0 refills | Status: DC | PRN

## 2024-01-03 NOTE — Progress Notes (Signed)
Subjective:    Patient ID: Joann Page, female    DOB: 11/21/1961, 63 y.o.   MRN: [id number]  HPI: Joann Page is a 63 y.o. female who returns for follow up appointment for chronic pain and medication refill. She states her pain is located in her lower back. She rates her pain 3. Her current exercise regime is walking and performing stretching exercises.  Ms. Brossard Morphine equivalent is 40.00 MME.   Last UDS was Performed on 10/31/2023, it was consistent.      Pain Inventory Average Pain 8 Pain Right Now 3 My pain is constant, sharp, burning, dull, stabbing, tingling, and aching  In the last 24 hours, has pain interfered with the following? General activity 0 Relation with others 0 Enjoyment of life 0 What TIME of day is your pain at its worst? morning  Sleep (in general) Good  Pain is worse with: walking, bending, sitting, inactivity, standing, and some activites Pain improves with: rest, medication, and heat Relief from Meds: 8  Family History  Problem Relation Age of Onset   Colon cancer Father 71   Alcohol abuse Father    Colon polyps Brother    Anxiety disorder Brother    Depression Brother    Breast cancer Sister 24   Obesity Sister    Anxiety disorder Sister    Depression Sister    Anxiety disorder Mother    Depression Mother    Bipolar disorder Mother    Lymphoma Brother    Drug abuse Brother    Anxiety disorder Sister    Depression Sister    Bipolar disorder Sister    Esophageal cancer Neg Hx    Stomach cancer Neg Hx    Rectal cancer Neg Hx    Social History   Socioeconomic History   Marital status: Divorced    Spouse name: Not on file   Number of children: 1   Years of education: Not on file   Highest education level: Associate degree: occupational, Scientist, product/process development, or vocational program  Occupational History   Occupation: Chief Financial Officer    CommentHydrologist  Tobacco Use   Smoking status: Never   Smokeless tobacco: Never  Vaping Use   Vaping  status: Never Used  Substance and Sexual Activity   Alcohol use: No   Drug use: Yes    Comment: have drinks monthly   Sexual activity: Yes  Other Topics Concern   Not on file  Social History Narrative   Married for 20+ years, divorced Dec 2017   Living on own      Social Drivers of Health   Financial Resource Strain: High Risk (05/27/2018)   Overall Financial Resource Strain (CARDIA)    Difficulty of Paying Living Expenses: Hard  Food Insecurity: Food Insecurity Present (05/27/2018)   Hunger Vital Sign    Worried About Running Out of Food in the Last Year: Often true    Ran Out of Food in the Last Year: Often true  Transportation Needs: No Transportation Needs (05/27/2018)   PRAPARE - Administrator, Civil Service (Medical): No    Lack of Transportation (Non-Medical): No  Physical Activity: Inactive (05/27/2018)   Exercise Vital Sign    Days of Exercise per Week: 0 days    Minutes of Exercise per Session: 0 min  Stress: Stress Concern Present (05/27/2018)   [employer] of Occupational Health - Occupational Stress Questionnaire    Feeling of Stress : Rather much  Social Connections: Socially Isolated (  05/27/2018)   Social Connection and Isolation Panel [NHANES]    Frequency of Communication with Friends and Family: Never    Frequency of Social Gatherings with Friends and Family: Never    Attends Religious Services: Never    Database administrator or Organizations: No    Attends Engineer, structural: Never    Marital Status: Separated   Past Surgical History:  Procedure Laterality Date   ABDOMINAL HYSTERECTOMY  04/2000   partial, fibroids   BREAST LUMPECTOMY  07/17/2012   Left Breast: Invasive ductal Cracinoma, 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   BREATH TEK H PYLORI  12/07/2011   Procedure: BREATH TEK H PYLORI;  Surgeon: Kandis Cocking, MD;  Location: [facility];  Service: Endoscopy;  Laterality: N/A;   COLONOSCOPY  08/25/2016   Dr.Nandigam   Needle Core Biopsy  06/30/2012   Left Breast: 12 O'clock/ Benign Parenchyma   Past Medical History:  Diagnosis Date   Anxiety    Arthritis 2010   lumbar spine   Breast cancer (HCC) 07/17/12   at age 28, ER/PR +, hER 2 -   Cancer (HCC) 07/07/12   Left Breast Inv Ductal Ca.   Degenerative disk disease 2010   Lumbar spine   Depression    GERD (gastroesophageal reflux disease)    hx   H/O hiatal hernia    small   Hyperlipidemia    Knee pain, right    No pertinent past medical history    Obesity    S/P radiation therapy 11/30/12 - 01/12/13   Left Breast / 50 gray in 25 Fractions with boost 10 gray in 5 Fractions   Status post chemotherapy comp 10/27/12    4 CyclesTaxotere/ Cytoxan   Status post chemotherapy Comp. 01/12/13   neoadjuvant chemotherapy: 4 Cycles of Taxotere/Cytoxan   Use of tamoxifen (Nolvadex) 01/18/13   There were no vitals taken for this visit.  Opioid Risk Score:   Fall Risk Score:  `1  Depression screen PHQ 2/9     12/07/2023    2:00 PM 12/06/2023    1:47 PM 10/31/2023   12:54 PM 09/30/2023    2:06 PM 09/02/2023   10:33 AM 07/28/2023    10:45 AM 04/05/2023   11:07 AM  Depression screen PHQ 2/9  Decreased Interest 0 0 0 0 0 1 0  Down, Depressed, Hopeless 0 0 0 0 0 0 0  PHQ - 2 Score 0 0 0 0 0 1 0  Altered sleeping      0 0  Tired, decreased energy      2 0  Change in appetite  0 0  Feeling bad or failure about yourself       0 0  Trouble concentrating      0 0  Moving slowly or fidgety/restless      0 0  Suicidal thoughts      0 0  PHQ-9 Score      3 0  Difficult doing work/chores       Not difficult at all    Review of Systems  Musculoskeletal:  Positive for back pain.  All other systems reviewed and are negative.      Objective:   Physical Exam Vitals and nursing note reviewed.  Constitutional:      Appearance: Normal appearance.  Cardiovascular:     Rate and Rhythm: Normal rate and regular rhythm.     Pulses: Normal pulses.     Heart sounds: Normal heart sounds.  Pulmonary:     Effort: Pulmonary effort is normal.     Breath sounds: Normal breath sounds.  Musculoskeletal:     Comments: Normal Muscle Bulk and Muscle Testing Reveals:  Upper Extremities: Full ROM and Muscle Strength 5/5  Lumbar Paraspinal Tenderness: L-3-5 Lower Extremities: Full ROM and Muscle Strength 5/5 Arises from Chair with ease Narrow Based  Gait     Skin:    General: Skin is warm and dry.  Neurological:     Mental Status: She is alert and oriented to person, place, and time.  Psychiatric:        Mood and Affect: Mood normal.        Behavior: Behavior normal.         Assessment & Plan:  Lumbar Spinal Stenosis/ Right Lumbar Radiculitis: Continue HEP as Tolerated.  She reports she seen Dr Fabio Pierce will continue to monitor. 01/03/2024.  Chronic Pian Syndrome: Refilled: Hydrocodone 10/325 mg one tablet 4 times a day as needed for pain #120. We will continue the opioid monitoring program, this consists of regular clinic visits, examinations, urine drug screen, pill counts as well as use of [place] Controlled  Substance Reporting system. A 12 month History has been reviewed on the [place] Controlled Substance Reporting System on 01/03/2024.   3. Essential Hypertension: Blood Pressure was re-checked. Ms. Henigan reports she is compliant with her anti-hypertensive medication.   F/U in 1 month

## 2024-01-03 NOTE — Progress Notes (Deleted)
 Subjective:    Patient ID: Joann Page, female    DOB: 1961-11-05, 63 y.o.   MRN: [id number]  HPI Pain Inventory Average Pain {NUMBERS; 0-10:5044} Pain Right Now {NUMBERS; 0-10:5044} My pain is {PAIN DESCRIPTION:21022940}  In the last 24 hours, has pain interfered with the following? General activity {NUMBERS; 0-10:5044} Relation with others {NUMBERS; 0-10:5044} Enjoyment of life {NUMBERS; 0-10:5044} What TIME of day is your pain at its worst? {time of day:24191} Sleep (in general) {BHH GOOD/FAIR/POOR:22877}  Pain is worse with: {ACTIVITIES:21022942} Pain improves with: {PAIN IMPROVES TPUY:78977056} Relief from Meds: {NUMBERS; 0-10:5044}  Family History  Problem Relation Age of Onset   Colon cancer Father 24   Alcohol abuse Father    Colon polyps Brother    Anxiety disorder Brother    Depression Brother    Breast cancer Sister 21   Obesity Sister    Anxiety disorder Sister    Depression Sister    Anxiety disorder Mother    Depression Mother    Bipolar disorder Mother    Lymphoma Brother    Drug abuse Brother    Anxiety disorder Sister    Depression Sister    Bipolar disorder Sister    Esophageal cancer Neg Hx    Stomach cancer Neg Hx    Rectal cancer Neg Hx    Social History   Socioeconomic History   Marital status: Divorced    Spouse name: Not on file   Number of children: 1   Years of education: Not on file   Highest education level: Associate degree: occupational, scientist, product/process development, or vocational program  Occupational History   Occupation: chief financial officer    Commenthydrologist  Tobacco Use   Smoking status: Never   Smokeless tobacco: Never  Vaping Use   Vaping status: Never Used  Substance and Sexual Activity   Alcohol use: No   Drug use: Yes    Comment: have drinks monthly   Sexual activity: Yes  Other Topics Concern   Not on file  Social History Narrative   Married for 20+ years, divorced Dec 2017   Living on own      Social Drivers of Health    Financial Resource Strain: High Risk (05/27/2018)   Overall Financial Resource Strain (CARDIA)    Difficulty of Paying Living Expenses: Hard  Food Insecurity: Food Insecurity Present (05/27/2018)   Hunger Vital Sign    Worried About Running Out of Food in the Last Year: Often true    Ran Out of Food in the Last Year: Often true  Transportation Needs: No Transportation Needs (05/27/2018)   PRAPARE - Administrator, Civil Service (Medical): No    Lack of Transportation (Non-Medical): No  Physical Activity: Inactive (05/27/2018)   Exercise Vital Sign    Days of Exercise per Week: 0 days    Minutes of Exercise per Session: 0 min  Stress: Stress Concern Present (05/27/2018)   [employer] of Occupational Health - Occupational Stress Questionnaire    Feeling of Stress : Rather much  Social Connections: Socially Isolated (05/27/2018)   Social Connection and Isolation Panel [NHANES]    Frequency of Communication with Friends and Family: Never    Frequency of Social Gatherings with Friends and Family: Never    Attends Religious Services: Never    Database Administrator or Organizations: No    Attends Banker Meetings: Never    Marital Status: Separated   Past Surgical History:  Procedure Laterality Date  ABDOMINAL HYSTERECTOMY  04/2000   partial, fibroids   BREAST LUMPECTOMY  07/17/2012   Left Breast: Invasive ductal Cracinoma, 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   BREATH TEK H PYLORI  12/07/2011   Procedure: BREATH TEK H PYLORI;  Surgeon: Alm VEAR Angle, MD;  Location: [facility];  Service: Endoscopy;  Laterality: N/A;   COLONOSCOPY  08/25/2016   Dr.Nandigam   Needle Core Biopsy  06/30/2012   Left Breast: 12 O'clock/ Benign Parenchyma   Past Medical History:  Diagnosis Date   Anxiety    Arthritis 2010   lumbar spine   Breast cancer (HCC) 07/17/12   at age 75, ER/PR +, hER 2 -   Cancer (HCC) 07/07/12   Left Breast Inv Ductal Ca.   Degenerative disk disease 2010   Lumbar spine   Depression    GERD (gastroesophageal reflux disease)    hx   H/O hiatal hernia    small   Hyperlipidemia    Knee pain, right    No pertinent past medical history    Obesity    S/P radiation therapy 11/30/12 - 01/12/13   Left Breast / 50 gray in 25 Fractions with boost 10 gray in 5 Fractions   Status post chemotherapy comp 10/27/12    4 CyclesTaxotere/ Cytoxan    Status post chemotherapy Comp. 01/12/13   neoadjuvant chemotherapy: 4 Cycles of Taxotere /Cytoxan    Use of tamoxifen  (Nolvadex ) 01/18/13   There were no vitals taken for this visit.  Opioid Risk Score:   Fall Risk Score:  `1  Depression screen PHQ 2/9     12/07/2023    2:00 PM 12/06/2023    1:47 PM 10/31/2023   12:54 PM 09/30/2023    2:06 PM 09/02/2023   10:33 AM 07/28/2023   10:45 AM 04/05/2023   11:07 AM  Depression screen PHQ 2/9  Decreased Interest 0 0 0 0 0 1 0  Down, Depressed, Hopeless 0 0 0 0 0 0 0  PHQ - 2 Score 0 0 0 0 0 1 0  Altered sleeping      0 0  Tired, decreased energy      2 0  Change in appetite      0 0  Feeling bad or failure about yourself       0  0  Trouble concentrating      0 0  Moving slowly or fidgety/restless      0 0  Suicidal thoughts      0 0  PHQ-9 Score      3 0  Difficult doing work/chores       Not  difficult at all      Review of Systems     Objective:   Physical Exam        Assessment & Plan:

## 2024-01-09 ENCOUNTER — Ambulatory Visit: Payer: 59 | Admitting: Internal Medicine

## 2024-01-16 ENCOUNTER — Encounter: Payer: Self-pay | Admitting: Internal Medicine

## 2024-01-16 ENCOUNTER — Ambulatory Visit (INDEPENDENT_AMBULATORY_CARE_PROVIDER_SITE_OTHER): Payer: 59 | Admitting: Internal Medicine

## 2024-01-16 VITALS — BP 124/82 | HR 81 | Temp 98.0°F | Ht 66.0 in | Wt 171.0 lb

## 2024-01-16 DIAGNOSIS — I10 Essential (primary) hypertension: Secondary | ICD-10-CM | POA: Diagnosis not present

## 2024-01-16 MED ORDER — VITAMIN B-12 1000 MCG PO TABS: 1000.0000 ug | ORAL_TABLET | Freq: Every day | ORAL | 3 refills | Status: DC

## 2024-01-16 MED ORDER — VALSARTAN-HYDROCHLOROTHIAZIDE 80-12.5 MG PO TABS: 1.0000 | ORAL_TABLET | Freq: Every day | ORAL | 1 refills | Status: DC

## 2024-01-16 MED ORDER — CYCLOBENZAPRINE HCL 10 MG PO TABS: ORAL_TABLET | ORAL | 1 refills | Status: DC

## 2024-01-16 MED ORDER — PANTOPRAZOLE SODIUM 40 MG PO TBEC: 40.0000 mg | DELAYED_RELEASE_TABLET | Freq: Every day | ORAL | 3 refills | Status: DC

## 2024-01-16 NOTE — Patient Instructions (Signed)
We will stop the hydrochlorothiazide 25 mg and start instead valsartan/hydrochlorothiazide

## 2024-01-16 NOTE — Progress Notes (Signed)
   Subjective:   Patient ID: Joann Page, female    DOB: 06-21-1961, 63 y.o.   MRN: [id number]  HPI The patient is a 63 YO female coming in for elevation of BP to high normal since around Christmas. Has no real change in diet and not exercising as much. Denies symptoms.  Review of Systems  Constitutional: Negative.   HENT: Negative.    Eyes: Negative.   Respiratory:  Negative for cough, chest tightness and shortness of breath.   Cardiovascular:  Negative for chest pain, palpitations and leg swelling.  Gastrointestinal:  Negative for abdominal distention, abdominal pain, constipation, diarrhea, nausea and vomiting.  Musculoskeletal: Negative.   Skin: Negative.   Neurological: Negative.   Psychiatric/Behavioral: Negative.      Objective:  Physical Exam Constitutional:      Appearance: She is well-developed.  HENT:     Head: Normocephalic and atraumatic.  Cardiovascular:     Rate and Rhythm: Normal rate and regular rhythm.  Pulmonary:     Effort: Pulmonary effort is normal. No respiratory distress.     Breath sounds: Normal breath sounds. No wheezing or rales.  Abdominal:     General: Bowel sounds are normal. There is no distension.     Palpations: Abdomen is soft.     Tenderness: There is no abdominal tenderness. There is no rebound.  Musculoskeletal:     Cervical back: Normal range of motion.  Skin:    General: Skin is warm and dry.  Neurological:     Mental Status: She is alert and oriented to person, place, and time.     Coordination: Coordination normal.     Vitals:   01/16/24 0845  BP: 136/86  Pulse: 81  Temp: 98 F (36.7 C)  TempSrc: Oral  SpO2: 97%  Weight: 171 lb (77.6 kg)  Height: 5\' 6"  (1.676 m)    Assessment & Plan:

## 2024-01-16 NOTE — Assessment & Plan Note (Signed)
We will change hydrochlorothiazide 25 mg daily to valsartan/hydrochlorothiazide 80/12.5 mg daily to help lower BP more effectively. She will let us know how this is doing and we can adjust dosing as needed.

## 2024-01-18 ENCOUNTER — Telehealth: Payer: Self-pay | Admitting: Adult Health

## 2024-01-18 NOTE — Telephone Encounter (Signed)
Rescheduled appointment per provider due to weather. Patient is aware of the changes made to her upcoming appointment.

## 2024-01-19 ENCOUNTER — Inpatient Hospital Stay: Payer: 59 | Admitting: Adult Health

## 2024-01-23 ENCOUNTER — Inpatient Hospital Stay: Payer: 59 | Attending: Adult Health | Admitting: Adult Health

## 2024-01-23 VITALS — BP 140/74 | HR 75 | Temp 98.0°F | Resp 18 | Ht 66.0 in | Wt 172.1 lb

## 2024-01-23 DIAGNOSIS — Z853 Personal history of malignant neoplasm of breast: Secondary | ICD-10-CM | POA: Diagnosis not present

## 2024-01-23 DIAGNOSIS — L821 Other seborrheic keratosis: Secondary | ICD-10-CM | POA: Insufficient documentation

## 2024-01-23 DIAGNOSIS — L989 Disorder of the skin and subcutaneous tissue, unspecified: Secondary | ICD-10-CM

## 2024-01-23 NOTE — Progress Notes (Unsigned)
 [facility] Cancer Follow up:    Joann Broker, MD [address] [place] [address]   DIAGNOSIS: history of left breast cancer   SUMMARY OF ONCOLOGIC HISTORY: Oncology History  HX: breast cancer  07/03/2012 Initial Diagnosis   Left: IDC, MRI 2.3 cm mass   07/17/2012 Surgery   Left Lumpectomy: 1.7 cm, IDC, High grade, 4 SN neg,  with HG DCIS with necrosis, Oncotype Dx Intermed grade   07/31/2012 - 11/23/2012 Chemotherapy   Adjuvant chemo TC x 4   11/30/2012 - 01/12/2013 Radiation Therapy   Adjuvant XRT   01/18/2013 - 12/2020 Anti-estrogen oral therapy   Adjuvant  tamoxifen 10 years     CURRENT THERAPY: observation  INTERVAL HISTORY: Discussed the use of AI scribe software for clinical note transcription with the patient, who gave verbal consent to proceed.  Joann Page 63 y.o. female with a history of breast cancer, presents for a routine follow-up. She reports intermittent appearance of purplish spots on her skin, which disappear over time. Her primary care physician has previously evaluated these spots and found her platelet count to be normal. The patient also notes the presence of spots on her breast, corresponding to the areas where markers were placed during radiation therapy. She expresses concern about a scar tissue build-up in the same area. Additionally, the patient reports experiencing vaginal dryness, which has been causing discomfort during sexual activity. She has tried using a vitamin E suppository and a non-estrogen moisturizer to alleviate the dryness, but with limited success.   Patient Active Problem List   Diagnosis Date Noted   Senile purpura (HCC) 07/06/2023   Hyperlipidemia 04/05/2023   Overweight 04/05/2023   Vitamin D deficiency 01/18/2023   Insomnia 01/18/2023   Aortic atherosclerosis (HCC) 03/19/2021   Essential hypertension 11/03/2017   Routine general medical examination at a health care facility 01/02/2016   Arthritis     GERD (gastroesophageal reflux disease)    HX: breast cancer 07/10/2012    is allergic to sulfa antibiotics and misc. sulfonamide containing compounds.  MEDICAL HISTORY: Past Medical History:  Diagnosis Date   Anxiety    Arthritis 2010   lumbar spine   Breast cancer (HCC) 07/17/12   at age 82, ER/PR +, hER 2 -   Cancer (HCC) 07/07/12   Left Breast Inv Ductal Ca.   Degenerative disk disease 2010   Lumbar spine   Depression    GERD (gastroesophageal reflux disease)    hx   H/O hiatal hernia    small   Hyperlipidemia    Knee pain, right    No pertinent past medical history    Obesity    S/P radiation therapy 11/30/12 - 01/12/13   Left Breast / 50 gray in 25 Fractions with boost 10 gray in 5 Fractions   Status post chemotherapy comp 10/27/12    4 CyclesTaxotere/ Cytoxan   Status post chemotherapy Comp. 01/12/13   neoadjuvant chemotherapy: 4 Cycles of Taxotere/Cytoxan   Use of tamoxifen (Nolvadex) 01/18/13    SURGICAL HISTORY: Past Surgical History:  Procedure Laterality Date   ABDOMINAL HYSTERECTOMY  04/2000   partial, fibroids   BREAST LUMPECTOMY  07/17/2012   Left Breast: Invasive ductal Cracinoma, 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   BREATH TEK H PYLORI  12/07/2011   Procedure: BREATH  TEK H PYLORI;  Surgeon: Kandis Cocking, MD;  Location: [facility];  Service: Endoscopy;  Laterality: N/A;   COLONOSCOPY  08/25/2016   Dr.Nandigam   Needle Core Biopsy  06/30/2012   Left Breast: 12 O'clock/ Benign Parenchyma    SOCIAL HISTORY: Social History   Socioeconomic History   Marital status: Divorced    Spouse name: Not on file   Number of children: 1   Years of education: Not on file   Highest education level: Associate degree: occupational, Scientist, product/process development, or vocational program  Occupational History    Occupation: Chief Financial Officer    CommentHydrologist  Tobacco Use   Smoking status: Never   Smokeless tobacco: Never  Vaping Use   Vaping status: Never Used  Substance and Sexual Activity   Alcohol use: No   Drug use: Yes    Comment: have drinks monthly   Sexual activity: Yes  Other Topics Concern   Not on file  Social History Narrative   Married for 20+ years, divorced Dec 2017   Living on own      Social Drivers of Health   Financial Resource Strain: High Risk (05/27/2018)   Overall Financial Resource Strain (CARDIA)    Difficulty of Paying Living Expenses: Hard  Food Insecurity: Food Insecurity Present (05/27/2018)   Hunger Vital Sign    Worried About Running Out of Food in the Last Year: Often true    Ran Out of Food in the Last Year: Often true  Transportation Needs: No Transportation Needs (05/27/2018)   PRAPARE - Administrator, Civil Service (Medical): No    Lack of Transportation (Non-Medical): No  Physical Activity: Inactive (05/27/2018)   Exercise Vital Sign    Days of Exercise per Week: 0 days    Minutes of Exercise per Session: 0 min  Stress: Stress Concern Present (05/27/2018)   [employer] of Occupational Health - Occupational Stress Questionnaire    Feeling of Stress : Rather much  Social Connections: Socially Isolated (05/27/2018)   Social Connection and Isolation Panel [NHANES]    Frequency of Communication with Friends and Family: Never    Frequency of Social Gatherings with Friends and Family: Never    Attends Religious Services: Never    Database administrator or Organizations: No    Attends Banker Meetings: Never    Marital Status: Separated  Intimate Partner Violence: Not At Risk (05/27/2018)   Humiliation, Afraid, Rape, and Kick questionnaire    Fear of Current or Ex-Partner: No    Emotionally Abused: No    Physically Abused: No    Sexually Abused: No    FAMILY HISTORY: Family History  Problem Relation Age of Onset   Colon  cancer Father 62   Alcohol abuse Father    Colon polyps Brother    Anxiety disorder Brother    Depression Brother    Breast cancer Sister 26   Obesity Sister    Anxiety disorder Sister    Depression Sister    Anxiety disorder Mother    Depression Mother    Bipolar disorder Mother    Lymphoma Brother    Drug abuse Brother    Anxiety disorder Sister    Depression Sister    Bipolar disorder Sister    Esophageal cancer Neg Hx    Stomach cancer Neg Hx    Rectal cancer Neg Hx     Review of Systems  Constitutional:  Negative for appetite change, chills, fatigue, fever and unexpected weight  change.  HENT:   Negative for hearing loss, lump/mass and trouble swallowing.   Eyes:  Negative for eye problems and icterus.  Respiratory:  Negative for chest tightness, cough and shortness of breath.   Cardiovascular:  Negative for chest pain, leg swelling and palpitations.  Gastrointestinal:  Negative for abdominal distention, abdominal pain, constipation, diarrhea, nausea and vomiting.  Endocrine: Negative for hot flashes.  Genitourinary:  Negative for difficulty urinating.   Musculoskeletal:  Negative for arthralgias.  Skin:  Negative for itching and rash.  Neurological:  Negative for dizziness, extremity weakness, headaches and numbness.  Hematological:  Negative for adenopathy. Does not bruise/bleed easily.  Psychiatric/Behavioral:  Negative for depression. The patient is not nervous/anxious.       PHYSICAL EXAMINATION    Vitals:   01/23/24 1135  BP: (!) 140/74  Pulse: 75  Resp: 18  Temp: 98 F (36.7 C)  SpO2: 100%    Physical Exam Constitutional:      General: She is not in acute distress.    Appearance: Normal appearance. She is not toxic-appearing.  HENT:     Head: Normocephalic and atraumatic.     Mouth/Throat:     Mouth: Mucous membranes are moist.     Pharynx: Oropharynx is clear. No oropharyngeal exudate or posterior oropharyngeal erythema.  Eyes:     General:  No scleral icterus. Cardiovascular:     Rate and Rhythm: Normal rate and regular rhythm.     Pulses: Normal pulses.     Heart sounds: Normal heart sounds.  Pulmonary:     Effort: Pulmonary effort is normal.     Breath sounds: Normal breath sounds.  Chest:     Comments: Left breast s/p lumpectomy and radiation, no sign of local recurrence Abdominal:     General: Abdomen is flat. Bowel sounds are normal. There is no distension.     Palpations: Abdomen is soft.     Tenderness: There is no abdominal tenderness.  Musculoskeletal:        General: No swelling.     Cervical back: Neck supple.  Lymphadenopathy:     Cervical: No cervical adenopathy.     Upper Body:     Right upper body: No supraclavicular or axillary adenopathy.     Left upper body: No supraclavicular or axillary adenopathy.  Skin:    General: Skin is warm and dry.     Findings: No rash.  Neurological:     General: No focal deficit present.     Mental Status: She is alert.  Psychiatric:        Mood and Affect: Mood normal.        Behavior: Behavior normal.     ASSESSMENT and THERAPY PLAN:   HX: breast cancer Aydee is a 63 year old woman with history of stage Ia left-sided breast cancer that was estrogen positive diagnosed in August 2013.  She is status post lumpectomy followed by adjuvant chemotherapy followed by adjuvant radiation, followed by adjuvant tamoxifen x 7 years that she completed in 2022.  Breast Cancer (History) No evidence of malignancy on most recent mammogram (August 22, 2023). No new breast changes noted on physical exam. -Continue annual mammograms.  Skin Changes Noted new purple spots and seborrheic keratoses. Platelets within normal limits. -Referral to Dermatology for further evaluation.  Vaginal Dryness Experiencing vaginal dryness despite being sexually active. Currently using vitamin E suppositories with limited success. -Try using coconut oil as a lubricant and moisturizer. -Consider  consultation with pelvic physical therapist. -If symptoms  persist, can consider small amount of estrogen cream two to three times a week (after exhausting all other options).  General Health Maintenance -Continue healthy diet, incorporating more fruits and vegetables. -Next mammogram due in September 2025. -Schedule follow-up appointment for next year.   All questions were answered. The patient knows to call the clinic with any problems, questions or concerns. We can certainly see the patient much sooner if necessary.  Total encounter time:20 minutes*in face-to-face visit time, chart review, lab review, care coordination, order entry, and documentation of the encounter time.    Lillard Anes, NP 01/24/24 3:44 PM Medical Oncology and Hematology [facility] [address] [place], [place] [address] Tel. [phone]    Fax. [phone]  *Total Encounter Time as defined by the Centers for Medicare and Medicaid Services includes, in addition to the face-to-face time of a patient visit (documented in the note above) non-face-to-face time: obtaining and reviewing outside history, ordering and reviewing medications, tests or procedures, care coordination (communications with other health care professionals or caregivers) and documentation in the medical record.

## 2024-01-24 ENCOUNTER — Encounter: Payer: Self-pay | Admitting: Adult Health

## 2024-01-24 NOTE — Assessment & Plan Note (Signed)
 Joann Page is a 63 year old woman with history of stage Ia left-sided breast cancer that was estrogen positive diagnosed in August 2013.  She is status post lumpectomy followed by adjuvant chemotherapy followed by adjuvant radiation, followed by adjuvant tamoxifen x 7 years that she completed in 2022.  Breast Cancer (History) No evidence of malignancy on most recent mammogram (August 22, 2023). No new breast changes noted on physical exam. -Continue annual mammograms.  Skin Changes Noted new purple spots and seborrheic keratoses. Platelets within normal limits. -Referral to Dermatology for further evaluation.  Vaginal Dryness Experiencing vaginal dryness despite being sexually active. Currently using vitamin E suppositories with limited success. -Try using coconut oil as a lubricant and moisturizer. -Consider consultation with pelvic physical therapist. -If symptoms persist, can consider small amount of estrogen cream two to three times a week (after exhausting all other options).  General Health Maintenance -Continue healthy diet, incorporating more fruits and vegetables. -Next mammogram due in September 2025. -Schedule follow-up appointment for next year.

## 2024-01-28 DIAGNOSIS — Z419 Encounter for procedure for purposes other than remedying health state, unspecified: Secondary | ICD-10-CM | POA: Diagnosis not present

## 2024-02-01 ENCOUNTER — Encounter: Payer: Self-pay | Admitting: Registered Nurse

## 2024-02-01 ENCOUNTER — Telehealth: Payer: Self-pay | Admitting: Registered Nurse

## 2024-02-01 ENCOUNTER — Encounter: Payer: 59 | Attending: Physical Medicine & Rehabilitation | Admitting: Registered Nurse

## 2024-02-01 VITALS — BP 142/88 | HR 82 | Ht 66.0 in

## 2024-02-01 DIAGNOSIS — Z79891 Long term (current) use of opiate analgesic: Secondary | ICD-10-CM | POA: Diagnosis not present

## 2024-02-01 DIAGNOSIS — Z5181 Encounter for therapeutic drug level monitoring: Secondary | ICD-10-CM | POA: Insufficient documentation

## 2024-02-01 DIAGNOSIS — G894 Chronic pain syndrome: Secondary | ICD-10-CM | POA: Insufficient documentation

## 2024-02-01 DIAGNOSIS — M48061 Spinal stenosis, lumbar region without neurogenic claudication: Secondary | ICD-10-CM | POA: Diagnosis not present

## 2024-02-01 MED ORDER — HYDROCODONE-ACETAMINOPHEN 10-325 MG PO TABS: 1.0000 | ORAL_TABLET | Freq: Four times a day (QID) | ORAL | 0 refills | Status: DC | PRN

## 2024-02-01 NOTE — Telephone Encounter (Signed)
 Patient needs a prior auth on hydrocodone.

## 2024-02-01 NOTE — Progress Notes (Signed)
 Subjective:    Patient ID: Joann Page, female    DOB: September 25, 1961, 63 y.o.   MRN: [id number]  HPI: Joann Page is a 63 y.o. female who returns for follow up appointment for chronic pain and medication refill.She states her pain is located in her lower back. She rates her pain 3. Her current exercise regime is walking and performing stretching exercises.  Joann Page Morphine equivalent is 40.00 MME.   Last UDS was Performed on 10/30/2024, it was consistent.    Pain Inventory Average Pain 3 Pain Right Now 3 My pain is constant, sharp, burning, dull, stabbing, tingling, and aching  In the last 24 hours, has pain interfered with the following? General activity 8 Relation with others 0 Enjoyment of life 0 What TIME of day is your pain at its worst? morning , daytime, evening, night, and varies Sleep (in general) Good  Pain is worse with: bending and standing Pain improves with: rest, heat/ice, and medication Relief from Meds: 10  Family History  Problem Relation Age of Onset   Colon cancer Father 20   Alcohol abuse Father    Colon polyps Brother    Anxiety disorder Brother    Depression Brother    Breast cancer Sister 34   Obesity Sister    Anxiety disorder Sister    Depression Sister    Anxiety disorder Mother    Depression Mother    Bipolar disorder Mother    Lymphoma Brother    Drug abuse Brother    Anxiety disorder Sister    Depression Sister    Bipolar disorder Sister    Esophageal cancer Neg Hx    Stomach cancer Neg Hx    Rectal cancer Neg Hx    Social History   Socioeconomic History   Marital status: Divorced    Spouse name: Not on file   Number of children: 1   Years of education: Not on file   Highest education level: Associate degree: occupational, Scientist, product/process development, or vocational program  Occupational History   Occupation: Chief Financial Officer    CommentHydrologist  Tobacco Use   Smoking status: Never   Smokeless tobacco: Never  Vaping Use   Vaping status: Never  Used  Substance and Sexual Activity   Alcohol use: No   Drug use: Yes    Comment: have drinks monthly   Sexual activity: Yes  Other Topics Concern   Not on file  Social History Narrative   Married for 20+ years, divorced Dec 2017   Living on own      Social Drivers of Health   Financial Resource Strain: High Risk (05/27/2018)   Overall Financial Resource Strain (CARDIA)    Difficulty of Paying Living Expenses: Hard  Food Insecurity: Food Insecurity Present (05/27/2018)   Hunger Vital Sign    Worried About Running Out of Food in the Last Year: Often true    Ran Out of Food in the Last Year: Often true  Transportation Needs: No Transportation Needs (05/27/2018)   PRAPARE - Administrator, Civil Service (Medical): No    Lack of Transportation (Non-Medical): No  Physical Activity: Inactive (05/27/2018)   Exercise Vital Sign    Days of Exercise per Week: 0 days    Minutes of Exercise per Session: 0 min  Stress: Stress Concern Present (05/27/2018)   [employer] of Occupational Health - Occupational Stress Questionnaire    Feeling of Stress : Rather much  Social Connections: Socially Isolated (05/27/2018)  Social Advertising account executive [NHANES]    Frequency of Communication with Friends and Family: Never    Frequency of Social Gatherings with Friends and Family: Never    Attends Religious Services: Never    Database administrator or Organizations: No    Attends Engineer, structural: Never    Marital Status: Separated   Past Surgical History:  Procedure Laterality Date   ABDOMINAL HYSTERECTOMY  04/2000   partial, fibroids   BREAST LUMPECTOMY  07/17/2012   Left Breast: Invasive ductal Cracinoma, 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   BREATH TEK H PYLORI  12/07/2011   Procedure:  BREATH TEK H PYLORI;  Surgeon: Kandis Cocking, MD;  Location: [facility];  Service: Endoscopy;  Laterality: N/A;   COLONOSCOPY  08/25/2016   Dr.Nandigam   Needle Core Biopsy  06/30/2012   Left Breast: 12 O'clock/ Benign Parenchyma   Past Surgical History:  Procedure Laterality Date   ABDOMINAL HYSTERECTOMY  04/2000   partial, fibroids   BREAST LUMPECTOMY  07/17/2012   Left Breast: Invasive ductal Cracinoma, 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   BREATH TEK H PYLORI  12/07/2011   Procedure: BREATH TEK H PYLORI;  Surgeon: Kandis Cocking, MD;  Location: [facility];  Service: Endoscopy;  Laterality: N/A;   COLONOSCOPY  08/25/2016   Dr.Nandigam   Needle Core Biopsy  06/30/2012   Left Breast: 12 O'clock/ Benign Parenchyma   Past Medical History:  Diagnosis Date   Anxiety    Arthritis 2010   lumbar spine   Breast cancer (HCC) 07/17/12   at age 34, ER/PR +, hER 2 -   Cancer (HCC) 07/07/12   Left Breast Inv Ductal Ca.   Degenerative disk disease 2010   Lumbar spine   Depression    GERD (gastroesophageal reflux disease)    hx   H/O hiatal hernia    small   Hyperlipidemia    Knee pain, right    No pertinent past medical history    Obesity    S/P radiation therapy 11/30/12 - 01/12/13   Left Breast / 50 gray in 25 Fractions with boost 10 gray in 5 Fractions   Status post chemotherapy comp 10/27/12    4 CyclesTaxotere/ Cytoxan   Status post chemotherapy Comp. 01/12/13   neoadjuvant chemotherapy: 4 Cycles of Taxotere/Cytoxan   Use of tamoxifen (Nolvadex) 01/18/13   There were no vitals taken for this visit.  Opioid Risk Score:   Fall Risk Score:  `1  Depression screen PHQ 2/9     01/03/2024    1:20 PM 12/07/2023    2:00 PM 12/06/2023    1:47 PM 10/31/2023   12:54 PM 09/30/2023    2:06 PM 09/02/2023   10:33 AM  07/28/2023   10:45 AM  Depression screen PHQ 2/9  Decreased Interest 0 0 0 0 0 0 1  Down, Depressed, Hopeless 0 0 0 0 0 0 0  PHQ - 2 Score 0 0 0 0 0 0 1  Altered sleeping       0  Tired, decreased energy       2  Change in appetite  0  Feeling bad or failure about yourself        0  Trouble concentrating       0  Moving slowly or fidgety/restless       0  Suicidal thoughts       0  PHQ-9 Score       3    Review of Systems  Musculoskeletal:  Positive for back pain.  All other systems reviewed and are negative.      Objective:   Physical Exam Vitals and nursing note reviewed.  Constitutional:      Appearance: Normal appearance.  Cardiovascular:     Rate and Rhythm: Normal rate and regular rhythm.     Pulses: Normal pulses.     Heart sounds: Normal heart sounds.  Pulmonary:     Effort: Pulmonary effort is normal.     Breath sounds: Normal breath sounds.  Musculoskeletal:     Comments: Normal Muscle Bulk and Muscle Testing Reveals:  Upper Extremities: Full ROM and Muscle Strength 5/5 Lumbar Paraspinal Tenderness: L-4-L-5 Lower Extremities Full ROM and Muscle Strength 5/5 Arises from Chair with ease Narrow Based Gait     Skin:    General: Skin is warm and dry.  Neurological:     Mental Status: She is alert and oriented to person, place, and time.  Psychiatric:        Mood and Affect: Mood normal.        Behavior: Behavior normal.         Assessment & Plan:  Lumbar Spinal Stenosis/ Right Lumbar Radiculitis: Continue HEP as Tolerated.  She reports she seen Dr Fabio Pierce will continue to monitor. 02/01/2024.  Chronic Pian Syndrome: Refilled: Hydrocodone 10/325 mg one tablet 4 times a day as needed for pain #120. We will continue the opioid monitoring program, this consists of regular clinic visits, examinations, urine drug screen, pill counts as well as use of [place] Controlled Substance Reporting system. A 12 month History has been reviewed on the [place] Controlled Substance Reporting System on 02/01/2024.   3. Essential Hypertension: Blood Pressure was re-checked. Ms. Hamstra reports she is compliant with her anti-hypertensive medication.    F/U in 1 month

## 2024-02-02 ENCOUNTER — Encounter: Payer: Self-pay | Admitting: *Deleted

## 2024-02-02 NOTE — Telephone Encounter (Signed)
 Outcome Approved today by [facility] Medicaid 2017 Approved. This drug has been approved. Approved quantity: 120 tablets per 30 day(s). You may fill up to a 34 day supply at a retail pharmacy. You may fill up to a 90 day supply for maintenance drugs, please refer to the formulary for details. Please call the pharmacy to process your prescription claim. Effective Date: 02/02/2024 Authorization Expiration Date: 07/31/2024

## 2024-02-02 NOTE — Telephone Encounter (Signed)
 Opened in error

## 2024-02-02 NOTE — Telephone Encounter (Signed)
 Prior auth sent to [facility] Medicaid via [employer] (Key: [id number])

## 2024-02-29 NOTE — Progress Notes (Unsigned)
 Subjective:    Patient ID: Joann Page, female    DOB: [birth year], 63 y.o.   MRN: [id number]  Joann Page is a 63 y.o. female who returns for follow up appointment for chronic pain and medication refill. states *** pain is located in  ***. rates pain ***. current exercise regime is walking and performing stretching exercises.  Joann Page Morphine equivalent is *** MME.   UDS ordered today.      Pain Inventory Average Pain 3 Pain Right Now 3 My pain is sharp, dull, tingling, and aching  In the last 24 hours, has pain interfered with the following? General activity 2 Relation with others 0 Enjoyment of life 0 What TIME of day is your pain at its worst? morning  Sleep (in general) Good  Pain is worse with: sitting and standing Pain improves with: medication Relief from Meds: 10  Family History  Problem Relation Age of Onset   Colon cancer Father 69   Alcohol abuse Father    Colon polyps Brother    Anxiety disorder Brother    Depression Brother    Breast cancer Sister 39   Obesity Sister    Anxiety disorder Sister    Depression Sister    Anxiety disorder Mother    Depression Mother    Bipolar disorder Mother    Lymphoma Brother    Drug abuse Brother    Anxiety disorder Sister    Depression Sister    Bipolar disorder Sister    Esophageal cancer Neg Hx    Stomach cancer Neg Hx    Rectal cancer Neg Hx    Social History   Socioeconomic History   Marital status: Divorced    Spouse name: Not on file   Number of children: 1   Years of education: Not on file   Highest education level: Associate degree: occupational, Scientist, product/process development, or vocational program  Occupational History   Occupation: Chief Financial Officer    CommentHydrologist  Tobacco Use   Smoking status: Never   Smokeless tobacco: Never  Vaping Use   Vaping status: Never Used  Substance and Sexual Activity   Alcohol use: No   Drug use: Yes    Comment: have drinks monthly   Sexual activity: Yes  Other Topics  Concern   Not on file  Social History Narrative   Married for 20+ years, divorced Dec 2017   Living on own      Social Drivers of Health   Financial Resource Strain: High Risk (05/27/2018)   Overall Financial Resource Strain (CARDIA)    Difficulty of Paying Living Expenses: Hard  Food Insecurity: Food Insecurity Present (05/27/2018)   Hunger Vital Sign    Worried About Running Out of Food in the Last Year: Often true    Ran Out of Food in the Last Year: Often true  Transportation Needs: No Transportation Needs (05/27/2018)   PRAPARE - Administrator, Civil Service (Medical): No    Lack of Transportation (Non-Medical): No  Physical Activity: Inactive (05/27/2018)   Exercise Vital Sign    Days of Exercise per Week: 0 days    Minutes of Exercise per Session: 0 min  Stress: Stress Concern Present (05/27/2018)   [employer] of Occupational Health - Occupational Stress Questionnaire    Feeling of Stress : Rather much  Social Connections: Socially Isolated (05/27/2018)   Social Connection and Isolation Panel [NHANES]    Frequency of Communication with Friends and Family: Never  Frequency of Social Gatherings with Friends and Family: Never    Attends Religious Services: Never    Database administrator or Organizations: No    Attends Engineer, structural: Never    Marital Status: Separated   Past Surgical History:  Procedure Laterality Date   ABDOMINAL HYSTERECTOMY  04/2000   partial, fibroids   BREAST LUMPECTOMY  07/17/2012   Left Breast: Invasive ductal Cracinoma, 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   BREATH TEK H PYLORI  12/07/2011   Procedure: BREATH TEK H PYLORI;  Surgeon: Joann Cocking, MD;  Location: [facility];  Service: Endoscopy;  Laterality: N/A;   COLONOSCOPY  08/25/2016   Dr.Nandigam   Needle Core Biopsy  06/30/2012   Left Breast: 12 O'clock/ Benign Parenchyma   Past Medical History:  Diagnosis Date   Anxiety    Arthritis 2010   lumbar spine   Breast cancer (HCC) 07/17/12   at age 46, ER/PR +, hER 2 -   Cancer (HCC) 07/07/12   Left Breast Inv Ductal Ca.   Degenerative disk disease 2010   Lumbar spine   Depression    GERD (gastroesophageal reflux disease)    hx   H/O hiatal hernia    small   Hyperlipidemia    Knee pain, right    No pertinent past medical history    Obesity    S/P radiation therapy 11/30/12 - 01/12/13   Left Breast / 50 gray in 25 Fractions with boost 10 gray in 5 Fractions   Status post chemotherapy comp 10/27/12    4 CyclesTaxotere/ Cytoxan   Status post chemotherapy Comp. 01/12/13   neoadjuvant chemotherapy: 4 Cycles of Taxotere/Cytoxan   Use of tamoxifen (Nolvadex) 01/18/13   BP 125/86   Pulse 88   Ht 5\' 6"  (1.676 m)   SpO2 92%   BMI 27.78 kg/m   Opioid Risk Score:   Fall Risk Score:  `1  Depression screen [facility] 2/9     03/01/2024   12:58 PM 02/01/2024   12:50 PM 01/03/2024    1:20 PM 12/07/2023    2:00 PM 12/06/2023    1:47 PM 10/31/2023   12:54 PM 09/30/2023    2:06 PM  Depression screen PHQ 2/9  Decreased Interest 0 0 0 0 0 0 0  Down, Depressed, Hopeless 0 0 0 0  0 0 0  PHQ - 2 Score 0 0 0 0 0 0 0    Review of Systems  Musculoskeletal:  Positive for back pain.  All other systems reviewed and are negative.      Objective:   Physical Exam  Assessment & Plan:  Lumbar Spinal Stenosis/ Right Lumbar Radiculitis: Continue HEP as Tolerated.  She reports she seen Dr Fabio Pierce will continue to monitor. 02/01/2024.  Chronic Pian Syndrome: Refilled: Hydrocodone 10/325 mg one tablet 4 times a day as needed for pain #120. We will continue the opioid monitoring program, this consists of regular clinic visits, examinations, urine drug screen, pill counts as well as use of [place] Controlled Substance Reporting system. A 12 month History has been reviewed on the [place] Controlled Substance Reporting System on 02/01/2024.   3. Essential Hypertension: Blood Pressure was re-checked. Ms. Pew reports she is compliant with her anti-hypertensive medication.    F/U in 1 month

## 2024-03-01 ENCOUNTER — Encounter: Payer: Self-pay | Admitting: Registered Nurse

## 2024-03-01 ENCOUNTER — Encounter: Attending: Physical Medicine & Rehabilitation | Admitting: Registered Nurse

## 2024-03-01 VITALS — BP 125/86 | HR 88 | Ht 66.0 in

## 2024-03-01 DIAGNOSIS — M48061 Spinal stenosis, lumbar region without neurogenic claudication: Secondary | ICD-10-CM | POA: Diagnosis not present

## 2024-03-01 DIAGNOSIS — G8929 Other chronic pain: Secondary | ICD-10-CM | POA: Insufficient documentation

## 2024-03-01 DIAGNOSIS — M546 Pain in thoracic spine: Secondary | ICD-10-CM | POA: Insufficient documentation

## 2024-03-01 DIAGNOSIS — Z5181 Encounter for therapeutic drug level monitoring: Secondary | ICD-10-CM | POA: Insufficient documentation

## 2024-03-01 DIAGNOSIS — G894 Chronic pain syndrome: Secondary | ICD-10-CM | POA: Diagnosis not present

## 2024-03-01 DIAGNOSIS — Z79891 Long term (current) use of opiate analgesic: Secondary | ICD-10-CM | POA: Diagnosis present

## 2024-03-01 MED ORDER — HYDROCODONE-ACETAMINOPHEN 10-325 MG PO TABS: 1.0000 | ORAL_TABLET | Freq: Four times a day (QID) | ORAL | 0 refills | Status: DC | PRN

## 2024-03-06 LAB — TOXASSURE SELECT,+ANTIDEPR,UR

## 2024-03-10 DIAGNOSIS — Z419 Encounter for procedure for purposes other than remedying health state, unspecified: Secondary | ICD-10-CM | POA: Diagnosis not present

## 2024-03-29 NOTE — Progress Notes (Signed)
 Subjective:    Patient ID: Joann Page, female    DOB: 04-13-1961, 63 y.o.   MRN: [id number]  HPI: Joann Page is a 63 y.o. female who returns for follow up appointment for chronic pain and medication refill. She states her pain is located in her lower back. She rates her pain 6. Her current exercise regime is walking and performing stretching exercises.  [name]  Morphine  equivalent is 40.00 MME.   Last 03/01/2024, it was consistent.     Pain Inventory Average Pain 3 Pain Right Now 6 My pain is dull, tingling, and aching  In the last 24 hours, has pain interfered with the following? General activity 0 Relation with others 0 Enjoyment of life 0 What TIME of day is your pain at its worst? varies Sleep (in general) Good  Pain is worse with: standing and some activites Pain improves with: rest, medication, and TENS Relief from Meds: 8  Family History  Problem Relation Age of Onset   Colon cancer Father 80   Alcohol abuse Father    Colon polyps Brother    Anxiety disorder Brother    Depression Brother    Breast cancer Sister 74   Obesity Sister    Anxiety disorder Sister    Depression Sister    Anxiety disorder Mother    Depression Mother    Bipolar disorder Mother    Lymphoma Brother    Drug abuse Brother    Anxiety disorder Sister    Depression Sister    Bipolar disorder Sister    Esophageal cancer Neg Hx    Stomach cancer Neg Hx    Rectal cancer Neg Hx    Social History   Socioeconomic History   Marital status: Divorced    Spouse name: Not on file   Number of children: 1   Years of education: Not on file   Highest education level: Associate degree: occupational, Scientist, product/process development, or vocational program  Occupational History   Occupation: Chief Financial Officer    CommentHydrologist  Tobacco Use   Smoking status: Never   Smokeless tobacco: Never  Vaping Use   Vaping status: Never Used  Substance and Sexual Activity   Alcohol use: No   Drug use: Yes    Comment: have  drinks monthly   Sexual activity: Yes  Other Topics Concern   Not on file  Social History Narrative   Married for 20+ years, divorced Dec 2017   Living on own      Social Drivers of Health   Financial Resource Strain: High Risk (05/27/2018)   Overall Financial Resource Strain (CARDIA)    Difficulty of Paying Living Expenses: Hard  Food Insecurity: Food Insecurity Present (05/27/2018)   Hunger Vital Sign    Worried About Running Out of Food in the Last Year: Often true    Ran Out of Food in the Last Year: Often true  Transportation Needs: No Transportation Needs (05/27/2018)   PRAPARE - Administrator, Civil Service (Medical): No    Lack of Transportation (Non-Medical): No  Physical Activity: Inactive (05/27/2018)   Exercise Vital Sign    Days of Exercise per Week: 0 days    Minutes of Exercise per Session: 0 min  Stress: Stress Concern Present (05/27/2018)   [employer] of Occupational Health - Occupational Stress Questionnaire    Feeling of Stress : Rather much  Social Connections: Socially Isolated (05/27/2018)   Social Connection and Isolation Panel [NHANES]    Frequency  of Communication with Friends and Family: Never    Frequency of Social Gatherings with Friends and Family: Never    Attends Religious Services: Never    Database administrator or Organizations: No    Attends Engineer, structural: Never    Marital Status: Separated   Past Surgical History:  Procedure Laterality Date   ABDOMINAL HYSTERECTOMY  04/2000   partial, fibroids   BREAST LUMPECTOMY  07/17/2012   Left Breast: Invasive ductal Cracinoma, 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   BREATH TEK H PYLORI  12/07/2011   Procedure: BREATH TEK H PYLORI;  Surgeon: Thayne Fine, MD;  Location: [facility];  Service: Endoscopy;  Laterality: N/A;   COLONOSCOPY  08/25/2016   Dr.Nandigam   Needle Core Biopsy  06/30/2012   Left Breast: 12 O'clock/ Benign Parenchyma   Past Medical History:  Diagnosis Date   Anxiety    Arthritis 2010   lumbar spine   Breast cancer (HCC) 07/17/12   at age 60, ER/PR +, hER 2 -   Cancer (HCC) 07/07/12   Left Breast Inv Ductal Ca.   Degenerative disk disease 2010   Lumbar spine   Depression    GERD (gastroesophageal reflux disease)    hx   H/O hiatal hernia    small   Hyperlipidemia    Knee pain, right    No pertinent past medical history    Obesity    S/P radiation therapy 11/30/12 - 01/12/13   Left Breast / 50 gray in 25 Fractions with boost 10 gray in 5 Fractions   Status post chemotherapy comp 10/27/12    4 CyclesTaxotere/ Cytoxan    Status post chemotherapy Comp. 01/12/13   neoadjuvant chemotherapy: 4 Cycles of Taxotere /Cytoxan    Use of tamoxifen  (Nolvadex ) 01/18/13   BP 127/86   Pulse 93   Resp 16   Ht 5\' 6"  (1.676 m)   SpO2 99%   BMI 27.78 kg/m   Opioid Risk Score:   Fall Risk Score:  `1  Depression screen [facility] 2/9     03/01/2024   12:58 PM 02/01/2024   12:50 PM 01/03/2024    1:20 PM 12/07/2023    2:00 PM 12/06/2023    1:47 PM 10/31/2023   12:54 PM 09/30/2023    2:06 PM  Depression screen PHQ 2/9   Decreased Interest 0 0 0 0 0 0 0  Down, Depressed, Hopeless 0 0 0 0 0 0 0  PHQ - 2 Score 0 0 0 0 0 0 0    Review of Systems  Musculoskeletal:        Lower back Mid  back        Objective:   Physical Exam Vitals and nursing note reviewed.  Constitutional:      Appearance: Normal appearance.  Cardiovascular:     Rate and Rhythm: Normal rate and regular rhythm.     Pulses: Normal pulses.     Heart sounds: Normal heart sounds.  Pulmonary:     Effort: Pulmonary effort is normal.     Breath sounds: Normal breath sounds.  Musculoskeletal:     Comments: Normal Muscle Bulk and Muscle Testing Reveals:  Upper Extremities: Full ROM and Muscle Strength 5/5  Lumbar Paraspinal Tenderness: L-4-L-5 Lower Extremities: Full ROM and Muscle Strength 5/5 Arises from Chair with ease Narrow based Gait     Skin:    General: Skin is warm and dry.  Neurological:     Mental Status: She is alert and oriented to person, place, and time.  Psychiatric:        Mood and Affect: Mood normal.        Behavior: Behavior normal.         Assessment & Plan:  Lumbar Spinal Stenosis/ Right Lumbar Radiculitis: Continue HEP as Tolerated.  She reports she seen Dr Aundra Bloodgood will continue to monitor. 03/30/2024. Chronic Bilateral Thoracic Back Pain: Continue HEP . Continue current medication regimen. Continue to monitor. 03/30/2024.  Chronic Pain Syndrome: Refilled: Hydrocodone  10/325 mg one tablet 4 times a day as needed for pain #120. We will continue the opioid monitoring program, this consists of regular clinic visits, examinations, urine drug screen, pill counts as well as use of [place]  Controlled Substance Reporting system. A 12 month History has been reviewed on the [place]  Controlled Substance Reporting System on 03/30/2024.    F/U in 1 month

## 2024-03-30 ENCOUNTER — Encounter: Attending: Physical Medicine & Rehabilitation | Admitting: Registered Nurse

## 2024-03-30 ENCOUNTER — Encounter: Payer: Self-pay | Admitting: Registered Nurse

## 2024-03-30 VITALS — BP 127/86 | HR 93 | Resp 16 | Ht 66.0 in

## 2024-03-30 DIAGNOSIS — Z5181 Encounter for therapeutic drug level monitoring: Secondary | ICD-10-CM

## 2024-03-30 DIAGNOSIS — Z79891 Long term (current) use of opiate analgesic: Secondary | ICD-10-CM | POA: Diagnosis not present

## 2024-03-30 DIAGNOSIS — M48061 Spinal stenosis, lumbar region without neurogenic claudication: Secondary | ICD-10-CM | POA: Diagnosis not present

## 2024-03-30 DIAGNOSIS — G894 Chronic pain syndrome: Secondary | ICD-10-CM

## 2024-03-30 MED ORDER — HYDROCODONE-ACETAMINOPHEN 10-325 MG PO TABS
1.0000 | ORAL_TABLET | Freq: Four times a day (QID) | ORAL | 0 refills | Status: DC | PRN
Start: 2024-03-30 — End: 2024-05-02

## 2024-04-09 DIAGNOSIS — Z419 Encounter for procedure for purposes other than remedying health state, unspecified: Secondary | ICD-10-CM | POA: Diagnosis not present

## 2024-04-10 ENCOUNTER — Other Ambulatory Visit: Payer: Self-pay | Admitting: Internal Medicine

## 2024-04-11 ENCOUNTER — Encounter: Payer: Self-pay | Admitting: Internal Medicine

## 2024-04-11 ENCOUNTER — Ambulatory Visit (INDEPENDENT_AMBULATORY_CARE_PROVIDER_SITE_OTHER): Admitting: Internal Medicine

## 2024-04-11 VITALS — BP 128/84 | HR 82 | Temp 98.4°F | Ht 66.0 in | Wt 176.0 lb

## 2024-04-11 DIAGNOSIS — I10 Essential (primary) hypertension: Secondary | ICD-10-CM | POA: Diagnosis not present

## 2024-04-11 DIAGNOSIS — I700 Atherosclerosis of aorta: Secondary | ICD-10-CM

## 2024-04-11 DIAGNOSIS — Z0000 Encounter for general adult medical examination without abnormal findings: Secondary | ICD-10-CM | POA: Diagnosis not present

## 2024-04-11 DIAGNOSIS — E559 Vitamin D deficiency, unspecified: Secondary | ICD-10-CM

## 2024-04-11 DIAGNOSIS — E782 Mixed hyperlipidemia: Secondary | ICD-10-CM | POA: Diagnosis not present

## 2024-04-11 DIAGNOSIS — K219 Gastro-esophageal reflux disease without esophagitis: Secondary | ICD-10-CM | POA: Diagnosis not present

## 2024-04-11 DIAGNOSIS — Z853 Personal history of malignant neoplasm of breast: Secondary | ICD-10-CM

## 2024-04-11 DIAGNOSIS — D692 Other nonthrombocytopenic purpura: Secondary | ICD-10-CM

## 2024-04-11 LAB — CBC
HCT: 42.8 % (ref 36.0–46.0)
Hemoglobin: 14.1 g/dL (ref 12.0–15.0)
MCHC: 33 g/dL (ref 30.0–36.0)
MCV: 91.8 fl (ref 78.0–100.0)
Platelets: 213 10*3/uL (ref 150.0–400.0)
RBC: 4.67 Mil/uL (ref 3.87–5.11)
RDW: 12.2 % (ref 11.5–15.5)
WBC: 3.8 10*3/uL — ABNORMAL LOW (ref 4.0–10.5)

## 2024-04-11 LAB — COMPREHENSIVE METABOLIC PANEL WITH GFR
ALT: 27 U/L (ref 0–35)
AST: 23 U/L (ref 0–37)
Albumin: 4.1 g/dL (ref 3.5–5.2)
Alkaline Phosphatase: 59 U/L (ref 39–117)
BUN: 7 mg/dL (ref 6–23)
CO2: 30 meq/L (ref 19–32)
Calcium: 9.4 mg/dL (ref 8.4–10.5)
Chloride: 101 meq/L (ref 96–112)
Creatinine, Ser: 0.76 mg/dL (ref 0.40–1.20)
GFR: 84.66 mL/min (ref >60.00)
Glucose, Bld: 83 mg/dL (ref 70–99)
Potassium: 4.2 meq/L (ref 3.5–5.1)
Sodium: 139 meq/L (ref 135–145)
Total Bilirubin: 0.4 mg/dL (ref 0.2–1.2)
Total Protein: 6.7 g/dL (ref 6.0–8.3)

## 2024-04-11 LAB — HEMOGLOBIN A1C: Hgb A1c MFr Bld: 5.5 % (ref 4.6–6.5)

## 2024-04-11 LAB — VITAMIN D 25 HYDROXY (VIT D DEFICIENCY, FRACTURES): VITD: 40.41 ng/mL (ref 30.00–100.00)

## 2024-04-11 LAB — LIPID PANEL
Cholesterol: 175 mg/dL (ref 0–200)
HDL: 65.8 mg/dL (ref >39.00)
LDL Cholesterol: 91 mg/dL (ref 0–99)
NonHDL: 109.28
Total CHOL/HDL Ratio: 3
Triglycerides: 93 mg/dL (ref 0.0–149.0)
VLDL: 18.6 mg/dL (ref 0.0–40.0)

## 2024-04-11 MED ORDER — CYCLOBENZAPRINE HCL 10 MG PO TABS: ORAL_TABLET | ORAL | 1 refills | Status: AC

## 2024-04-11 MED ORDER — VALSARTAN-HYDROCHLOROTHIAZIDE 80-12.5 MG PO TABS: 1.0000 | ORAL_TABLET | Freq: Every day | ORAL | 3 refills | Status: AC

## 2024-04-11 MED ORDER — VITAMIN B-12 1000 MCG PO TABS: 1000.0000 ug | ORAL_TABLET | Freq: Every day | ORAL | 3 refills | Status: AC

## 2024-04-11 MED ORDER — PANTOPRAZOLE SODIUM 40 MG PO TBEC: 40.0000 mg | DELAYED_RELEASE_TABLET | Freq: Every day | ORAL | 3 refills | Status: AC

## 2024-04-11 NOTE — Assessment & Plan Note (Signed)
Gets yearly mammogram.

## 2024-04-11 NOTE — Assessment & Plan Note (Signed)
 Taking pravastatin  and will continue.

## 2024-04-11 NOTE — Assessment & Plan Note (Signed)
 Still having some intermittent lesions but nothing worrisome and prior normal CBC.

## 2024-04-11 NOTE — Assessment & Plan Note (Signed)
Taking protonix 40 mg daily and continue.

## 2024-04-11 NOTE — Assessment & Plan Note (Signed)
 BP at goal on valsartan /hydrochlorothiazide  80/12.5 mg daily. Checking CMP and adjust as needed.

## 2024-04-11 NOTE — Assessment & Plan Note (Signed)
 Checking vitamin D and adjust as needed.

## 2024-04-11 NOTE — Assessment & Plan Note (Signed)
 Flu shot yearly. Shingrix  complete. Tetanus due later this year. Colonoscopy up to date. Mammogram upto date, pap smear not indicated. Counseled about sun safety and mole surveillance. Counseled about the dangers of distracted driving. Given 10 year screening recommendations.

## 2024-04-11 NOTE — Assessment & Plan Note (Signed)
 Checking lipid panel and adjust pravastatin 40 mg daily as needed.

## 2024-04-11 NOTE — Progress Notes (Signed)
   Subjective:   Patient ID: Joann Page, female    DOB: 1961/02/11, 63 y.o.   MRN: [id number]  HPI The patient is here for physical.  PMH, [facility], social history reviewed and updated  Review of Systems  Constitutional: Negative.   HENT: Negative.    Eyes: Negative.   Respiratory:  Negative for cough, chest tightness and shortness of breath.   Cardiovascular:  Negative for chest pain, palpitations and leg swelling.  Gastrointestinal:  Negative for abdominal distention, abdominal pain, constipation, diarrhea, nausea and vomiting.  Musculoskeletal:  Positive for back pain.  Skin: Negative.   Neurological: Negative.   Psychiatric/Behavioral: Negative.      Objective:  Physical Exam Constitutional:      Appearance: She is well-developed.  HENT:     Head: Normocephalic and atraumatic.  Cardiovascular:     Rate and Rhythm: Normal rate and regular rhythm.  Pulmonary:     Effort: Pulmonary effort is normal. No respiratory distress.     Breath sounds: Normal breath sounds. No wheezing or rales.  Abdominal:     General: Bowel sounds are normal. There is no distension.     Palpations: Abdomen is soft.     Tenderness: There is no abdominal tenderness. There is no rebound.  Musculoskeletal:     Cervical back: Normal range of motion.  Skin:    General: Skin is warm and dry.  Neurological:     Mental Status: She is alert and oriented to person, place, and time.     Coordination: Coordination normal.     Vitals:   04/11/24 0904  BP: 128/84  Pulse: 82  Temp: 98.4 F (36.9 C)  TempSrc: Oral  SpO2: 98%  Weight: 176 lb (79.8 kg)  Height: 5\' 6"  (1.676 m)    Assessment & Plan:

## 2024-04-12 ENCOUNTER — Telehealth: Payer: Self-pay | Admitting: Internal Medicine

## 2024-04-12 NOTE — Telephone Encounter (Signed)
 I will be able to advise the patient once the provider is able to review

## 2024-04-12 NOTE — Telephone Encounter (Signed)
 Copied from CRM [id number]. Topic: Clinical - Lab/Test Results >> Apr 11, 2024  4:18 PM Jim Motts C wrote: Reason for CRM: Patient called in requesting to speak with a Nurse in regards to lab test results showing Abnormal. Reached CAL and was advised that the PCP didn't have a chance yet to review and will follow up with Patient.  Advised Patient and also let know that I will leave a message that she did outreach to go over labs with a nurse. Patient can be reached at [phone] (M).

## 2024-04-16 ENCOUNTER — Ambulatory Visit: Payer: Self-pay | Admitting: Internal Medicine

## 2024-04-16 NOTE — Telephone Encounter (Signed)
 Copied from CRM [id number]. Topic: Clinical - Lab/Test Results >> Apr 11, 2024  4:18 PM Jim Motts C wrote: Reason for CRM: Patient called in requesting to speak with a Nurse in regards to lab test results showing Abnormal. Reached CAL and was advised that the PCP didn't have a chance yet to review and will follow up with Patient.  Advised Patient and also let know that I will leave a message that she did outreach to go over labs with a nurse. Patient can be reached at [phone] (M). >> Apr 13, 2024  8:24 AM Allyne Areola wrote: Patient is calling to follow up on her request to go over lab work, I advised that the message was received but awaiting Dr.Crawford to review the results.

## 2024-05-02 ENCOUNTER — Encounter: Payer: Self-pay | Admitting: Registered Nurse

## 2024-05-02 ENCOUNTER — Encounter: Attending: Physical Medicine & Rehabilitation | Admitting: Registered Nurse

## 2024-05-02 VITALS — BP 135/87 | HR 95 | Ht 66.0 in | Wt 172.0 lb

## 2024-05-02 DIAGNOSIS — M48061 Spinal stenosis, lumbar region without neurogenic claudication: Secondary | ICD-10-CM | POA: Insufficient documentation

## 2024-05-02 DIAGNOSIS — Z5181 Encounter for therapeutic drug level monitoring: Secondary | ICD-10-CM | POA: Insufficient documentation

## 2024-05-02 DIAGNOSIS — G894 Chronic pain syndrome: Secondary | ICD-10-CM | POA: Insufficient documentation

## 2024-05-02 DIAGNOSIS — Z79891 Long term (current) use of opiate analgesic: Secondary | ICD-10-CM | POA: Insufficient documentation

## 2024-05-02 MED ORDER — HYDROCODONE-ACETAMINOPHEN 10-325 MG PO TABS: 1.0000 | ORAL_TABLET | Freq: Four times a day (QID) | ORAL | 0 refills | Status: DC | PRN

## 2024-05-02 NOTE — Progress Notes (Signed)
 Subjective:    Patient ID: Joann Page, female    DOB: 1961/01/02, 63 y.o.   MRN: [id number]  HPI: Joann Page is a 63 y.o. female who returns for follow up appointment for chronic pain and medication refill. She states her pain is located in her lower back. She rates her pain 3. Her current exercise regime is walking and performing stretching exercises.  [name] Morphine  equivalent is 40.00 MME.   Last UDS was Performed on 03/01/2024, it was consistent.     Pain Inventory Average Pain 3 Pain Right Now 3 My pain is intermittent, tingling, aching, and numbing  In the last 24 hours, has pain interfered with the following? General activity 2 Relation with others 0 Enjoyment of life 0 What TIME of day is your pain at its worst? morning , night, and varies Sleep (in general) Good  Pain is worse with: inactivity and standing Pain improves with: rest, medication, TENS, and injections Relief from Meds: 9  Family History  Problem Relation Age of Onset   Colon cancer Father 59   Alcohol abuse Father    Colon polyps Brother    Anxiety disorder Brother    Depression Brother    Breast cancer Sister 57   Obesity Sister    Anxiety disorder Sister    Depression Sister    Anxiety disorder Mother    Depression Mother    Bipolar disorder Mother    Lymphoma Brother    Drug abuse Brother    Anxiety disorder Sister    Depression Sister    Bipolar disorder Sister    Esophageal cancer Neg Hx    Stomach cancer Neg Hx    Rectal cancer Neg Hx    Social History   Socioeconomic History   Marital status: Divorced    Spouse name: Not on file   Number of children: 1   Years of education: Not on file   Highest education level: Associate degree: occupational, Scientist, product/process development, or vocational program  Occupational History   Occupation: Chief Financial Officer    CommentHydrologist  Tobacco Use   Smoking status: Never   Smokeless tobacco: Never  Vaping Use   Vaping status: Never Used  Substance and  Sexual Activity   Alcohol use: No   Drug use: Yes    Comment: have drinks monthly   Sexual activity: Yes  Other Topics Concern   Not on file  Social History Narrative   Married for 20+ years, divorced Dec 2017   Living on own      Social Drivers of Health   Financial Resource Strain: High Risk (05/27/2018)   Overall Financial Resource Strain (CARDIA)    Difficulty of Paying Living Expenses: Hard  Food Insecurity: Food Insecurity Present (05/27/2018)   Hunger Vital Sign    Worried About Running Out of Food in the Last Year: Often true    Ran Out of Food in the Last Year: Often true  Transportation Needs: No Transportation Needs (05/27/2018)   PRAPARE - Administrator, Civil Service (Medical): No    Lack of Transportation (Non-Medical): No  Physical Activity: Inactive (05/27/2018)   Exercise Vital Sign    Days of Exercise per Week: 0 days    Minutes of Exercise per Session: 0 min  Stress: Stress Concern Present (05/27/2018)   [employer] of Occupational Health - Occupational Stress Questionnaire    Feeling of Stress : Rather much  Social Connections: Socially Isolated (05/27/2018)   Social Connection  and Isolation Panel [NHANES]    Frequency of Communication with Friends and Family: Never    Frequency of Social Gatherings with Friends and Family: Never    Attends Religious Services: Never    Database administrator or Organizations: No    Attends Engineer, structural: Never    Marital Status: Separated   Past Surgical History:  Procedure Laterality Date   ABDOMINAL HYSTERECTOMY  04/2000   partial, fibroids   BREAST LUMPECTOMY  07/17/2012   Left Breast: Invasive ductal Cracinoma, 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   BREATH TEK H PYLORI  12/07/2011   Procedure: BREATH TEK H PYLORI;  Surgeon: Thayne Fine, MD;  Location: [facility];  Service: Endoscopy;  Laterality: N/A;   COLONOSCOPY  08/25/2016   Dr.Nandigam   Needle Core Biopsy  06/30/2012   Left Breast: 12 O'clock/ Benign Parenchyma   Past Medical History:  Diagnosis Date   Anxiety    Arthritis 2010   lumbar spine   Breast cancer (HCC) 07/17/12   at age 40, ER/PR +, hER 2 -   Cancer (HCC) 07/07/12   Left Breast Inv Ductal Ca.   Degenerative disk disease 2010   Lumbar spine   Depression    GERD (gastroesophageal reflux disease)    hx   H/O hiatal hernia    small   Hyperlipidemia    Knee pain, right    No pertinent past medical history    Obesity    S/P radiation therapy 11/30/12 - 01/12/13   Left Breast / 50 gray in 25 Fractions with boost 10 gray in 5 Fractions   Status post chemotherapy comp 10/27/12    4 CyclesTaxotere/ Cytoxan    Status post chemotherapy Comp. 01/12/13   neoadjuvant chemotherapy: 4 Cycles of Taxotere /Cytoxan    Use of tamoxifen  (Nolvadex ) 01/18/13   BP 135/87 (BP Location: Left Arm, Patient Position: Sitting)   Pulse 95   Ht 5\' 6"  (1.676 m)   Wt 172 lb (78 kg)   SpO2 97%   BMI 27.76 kg/m   Opioid Risk Score:   Fall Risk Score:  `1  Depression screen [facility] 2/9     05/02/2024   10:56 AM 04/11/2024    9:06 AM 03/01/2024    12:58 PM 02/01/2024   12:50 PM 01/03/2024    1:20 PM 12/07/2023    2:00 PM 12/06/2023    1:47 PM  Depression screen PHQ 2/9  Decreased Interest 0 0 0 0 0 0 0  Down, Depressed, Hopeless 0 0 0 0 0 0 0  PHQ - 2 Score 0 0 0 0 0 0 0  Altered sleeping  0       Tired, decreased energy  0       Change in appetite  0       Feeling bad or failure about yourself   0       Trouble concentrating  0       Moving slowly or fidgety/restless  0       Suicidal thoughts  0       PHQ-9 Score  0       Difficult doing work/chores  Not difficult at all          Review of Systems     Objective:   Physical Exam Vitals and nursing note reviewed.  Constitutional:      Appearance: Normal appearance.  Cardiovascular:     Rate and Rhythm: Normal rate and regular rhythm.     Pulses: Normal pulses.     Heart sounds: Normal heart sounds.  Pulmonary:     Effort: Pulmonary effort is normal.     Breath sounds: Normal breath sounds.  Musculoskeletal:     Comments: Normal Muscle Bulk and Muscle Testing Reveals:  Upper Extremities: Full ROM and Muscle Strength 5/5 Lumbar Paraspinal Tenderness: L-4-L-5 Lower Extremities: Full ROM and Muscle Strength 5/5  Arises from Chair with ease Narrow Based Gait     Skin:    General: Skin is warm and dry.  Neurological:     Mental Status: She is alert and oriented to person, place, and time.  Psychiatric:        Mood and Affect: Mood normal.        Behavior: Behavior normal.         Assessment & Plan:  Lumbar Spinal Stenosis/ Right Lumbar Radiculitis: Continue HEP as Tolerated.  She reports she seen Dr Aundra Bloodgood will continue to monitor. 05/02/2024. Chronic Bilateral Thoracic Back Pain: Continue HEP . Continue current medication regimen. Continue to monitor. 05/02/2024.  Chronic Pain Syndrome: Refilled: Hydrocodone  10/325 mg one tablet 4 times a day as needed for pain #120. We will continue the opioid monitoring program, this consists of regular clinic visits,  examinations, urine drug screen, pill counts as well as use of [place]  Controlled Substance Reporting system. A 12 month History has been reviewed on the [place]  Controlled Substance Reporting System on 05/02/2024.    F/U in 1 month

## 2024-05-10 DIAGNOSIS — Z419 Encounter for procedure for purposes other than remedying health state, unspecified: Secondary | ICD-10-CM | POA: Diagnosis not present

## 2024-05-28 ENCOUNTER — Inpatient Hospital Stay: Attending: Adult Health | Admitting: Adult Health

## 2024-05-28 DIAGNOSIS — Z853 Personal history of malignant neoplasm of breast: Secondary | ICD-10-CM | POA: Diagnosis not present

## 2024-05-28 NOTE — Progress Notes (Signed)
 [facility] Cancer Follow up:    Joann Almarie LABOR, MD [address] [place] [address]   DIAGNOSIS: History of breast cancer  I connected with Joann Page on 05/28/24 at 10:20 AM EDT by telephone and verified that I am speaking with the correct person using two identifiers.  I discussed the limitations, risks, security and privacy concerns of performing an evaluation and management service by telephone and the availability of in person appointments.  I also discussed with the patient that there may be a patient responsible charge related to this service. The patient expressed understanding and agreed to proceed.   Patient location: home Provider location [facility] office  SUMMARY OF ONCOLOGIC HISTORY: Oncology History  HX: breast cancer  07/03/2012 Initial Diagnosis   Left: IDC, MRI 2.3 cm mass   07/17/2012 Surgery   Left Lumpectomy: 1.7 cm, IDC, High grade, 4 SN neg,  with HG DCIS with necrosis, Oncotype Dx Intermed grade   07/31/2012 - 11/23/2012 Chemotherapy   Adjuvant chemo TC x 4   11/30/2012 - 01/12/2013 Radiation Therapy   Adjuvant XRT   01/18/2013 - 12/2020 Anti-estrogen oral therapy   Adjuvant  tamoxifen  10 years     CURRENT THERAPY: observation  INTERVAL HISTORY:  Joann Page is here for f/u due to concern over a recent lab result with her PCP.  Her WBC was 3.8 and she is concerned since that was decreased and has been decreased in the past.  She also notes that she has an upcoming appointment in a few months with dermatology to do a skin check.    Patient Active Problem List   Diagnosis Date Noted   Senile purpura (HCC) 07/06/2023   Hyperlipidemia 04/05/2023   Overweight 04/05/2023   Vitamin D  deficiency 01/18/2023   Insomnia 01/18/2023   Aortic atherosclerosis (HCC) 03/19/2021   Essential hypertension 11/03/2017   Routine general medical examination at a health care facility 01/02/2016   Arthritis    GERD (gastroesophageal reflux disease)     HX: breast cancer 07/10/2012    is allergic to sulfa antibiotics and misc. sulfonamide containing compounds.  MEDICAL HISTORY: Past Medical History:  Diagnosis Date   Anxiety    Arthritis 2010   lumbar spine   Breast cancer (HCC) 07/17/12   at age 53, ER/PR +, hER 2 -   Cancer (HCC) 07/07/12   Left Breast Inv Ductal Ca.   Degenerative disk disease 2010   Lumbar spine   Depression    GERD (gastroesophageal reflux disease)    hx   H/O hiatal hernia    small   Hyperlipidemia    Knee pain, right    No pertinent past medical history    Obesity    S/P radiation therapy 11/30/12 - 01/12/13   Left Breast / 50 gray in 25 Fractions with boost 10 gray in 5 Fractions   Status post chemotherapy comp 10/27/12    4 CyclesTaxotere/ Cytoxan    Status post chemotherapy Comp. 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   BREATH  TEK H PYLORI  12/07/2011   Procedure: BREATH TEK H PYLORI;  Surgeon: Alm VEAR Angle, MD;  Location: [facility];  Service: Endoscopy;  Laterality: N/A;   COLONOSCOPY  08/25/2016   Dr.Nandigam   Needle Core Biopsy  06/30/2012   Left Breast: 12 O'clock/ Benign Parenchyma    SOCIAL HISTORY: Social History   Socioeconomic History   Marital status: Divorced    Spouse name: Not on file   Number of children: 1   Years of education: Not on file   Highest education level: Associate degree: occupational, Scientist, product/process development, or vocational program  Occupational History   Occupation: Chief Financial Officer    CommentHydrologist   Tobacco Use   Smoking status: Never   Smokeless tobacco: Never  Vaping Use   Vaping status: Never Used  Substance and Sexual Activity   Alcohol use: No   Drug use: Yes    Comment: have drinks monthly   Sexual activity: Yes  Other Topics Concern   Not on file  Social History Narrative   Married for 20+ years, divorced Dec 2017   Living on own      Social Drivers of Health   Financial Resource Strain: High Risk (05/27/2018)   Overall Financial Resource Strain (CARDIA)    Difficulty of Paying Living Expenses: Hard  Food Insecurity: Food Insecurity Present (05/27/2018)   Hunger Vital Sign    Worried About Running Out of Food in the Last Year: Often true    Ran Out of Food in the Last Year: Often true  Transportation Needs: No Transportation Needs (05/27/2018)   PRAPARE - Administrator, Civil Service (Medical): No    Lack of Transportation (Non-Medical): No  Physical Activity: Inactive (05/27/2018)   Exercise Vital Sign    Days of Exercise per Week: 0 days    Minutes of Exercise per Session: 0 min  Stress: Stress Concern Present (05/27/2018)   [employer] of Occupational Health - Occupational Stress Questionnaire    Feeling of Stress : Rather much  Social Connections: Socially Isolated (05/27/2018)   Social Connection and Isolation Panel    Frequency of Communication with Friends and Family: Never    Frequency of Social Gatherings with Friends and Family: Never    Attends Religious Services: Never    Database administrator or Organizations: No    Attends Banker Meetings: Never    Marital Status: Separated  Intimate Partner Violence: Not At Risk (05/27/2018)   Humiliation, Afraid, Rape, and Kick questionnaire    Fear of Current or Ex-Partner: No    Emotionally Abused: No    Physically Abused: No    Sexually Abused: No    FAMILY HISTORY: Family History  Problem Relation Age of Onset   Colon cancer Father 51   Alcohol abuse Father    Colon  polyps Brother    Anxiety disorder Brother    Depression Brother    Breast cancer Sister 44   Obesity Sister    Anxiety disorder Sister    Depression Sister    Anxiety disorder Mother    Depression Mother    Bipolar disorder Mother    Lymphoma Brother    Drug abuse Brother    Anxiety disorder Sister    Depression Sister    Bipolar disorder Sister    Esophageal cancer Neg Hx    Stomach cancer Neg Hx    Rectal cancer Neg Hx     Review of Systems  Constitutional:  Negative for  appetite change, chills, fatigue, fever and unexpected weight change.  HENT:   Negative for hearing loss, lump/mass and trouble swallowing.   Eyes:  Negative for eye problems and icterus.  Respiratory:  Negative for chest tightness, cough and shortness of breath.   Cardiovascular:  Negative for chest pain, leg swelling and palpitations.  Gastrointestinal:  Negative for abdominal distention, abdominal pain, constipation, diarrhea, nausea and vomiting.  Endocrine: Negative for hot flashes.  Genitourinary:  Negative for difficulty urinating.   Musculoskeletal:  Negative for arthralgias.  Skin:  Negative for itching and rash.  Neurological:  Negative for dizziness, extremity weakness, headaches and numbness.  Hematological:  Negative for adenopathy. Does not bruise/bleed easily.  Psychiatric/Behavioral:  Negative for depression. The patient is not nervous/anxious.       PHYSICAL EXAMINATION Patient sounds   LABORATORY DATA:  CBC    Component Value Date/Time   WBC 3.8 (L) 04/11/2024 0922   RBC 4.67 04/11/2024 0922   HGB 14.1 04/11/2024 0922   HGB 12.7 05/11/2016 1446   HCT 42.8 04/11/2024 0922   HCT 38.8 05/11/2016 1446   PLT 213.0 04/11/2024 0922   PLT 184 05/11/2016 1446   MCV 91.8 04/11/2024 0922   MCV 87.8 05/11/2016 1446   MCH 28.9 08/22/2020 2145   MCHC 33.0 04/11/2024 0922   RDW 12.2 04/11/2024 0922   RDW 12.7 05/11/2016 1446   LYMPHSABS 1.7 03/31/2022 0949   LYMPHSABS 2.1 05/11/2016  1446   MONOABS 0.3 03/31/2022 0949   MONOABS 0.4 05/11/2016 1446   EOSABS 0.0 03/31/2022 0949   EOSABS 0.1 05/11/2016 1446   BASOSABS 0.0 03/31/2022 0949   BASOSABS 0.0 05/11/2016 1446    CMP     Component Value Date/Time   NA 139 04/11/2024 0922   NA 141 05/11/2016 1447   K 4.2 04/11/2024 0922   K 4.5 05/11/2016 1447   CL 101 04/11/2024 0922   CL 108 (H) 04/25/2013 1340   CO2 30 04/11/2024 0922   CO2 28 05/11/2016 1447   GLUCOSE 83 04/11/2024 0922   GLUCOSE 89 05/11/2016 1447   GLUCOSE 89 04/25/2013 1340   BUN 7 04/11/2024 0922   BUN 9.6 05/11/2016 1447   CREATININE 0.76 04/11/2024 0922   CREATININE 0.84 06/05/2020 1526   CREATININE 0.8 05/11/2016 1447   CALCIUM 9.4 04/11/2024 0922   CALCIUM 9.5 05/11/2016 1447   PROT 6.7 04/11/2024 0922   PROT 6.9 05/11/2016 1447   ALBUMIN 4.1 04/11/2024 0922   ALBUMIN 3.4 (L) 05/11/2016 1447   AST 23 04/11/2024 0922   AST 25 05/11/2016 1447   ALT 27 04/11/2024 0922   ALT 28 05/11/2016 1447   ALKPHOS 59 04/11/2024 0922   ALKPHOS 45 05/11/2016 1447   BILITOT 0.4 04/11/2024 0922   BILITOT <0.30 05/11/2016 1447   GFRNONAA >60 08/22/2020 2145   GFRAA >60 08/22/2020 2145     ASSESSMENT and THERAPY PLAN:   HX: breast cancer Joann Page is a 63 year old woman with history of stage Ia left-sided breast cancer that was estrogen positive diagnosed in August 2013.  She is status post lumpectomy followed by adjuvant chemotherapy followed by adjuvant radiation, followed by adjuvant tamoxifen  x 7 years that she completed in 2022.  Breast Cancer (History) No evidence of malignancy on most recent mammogram (August 22, 2023). -Continue annual mammograms. -Continue long term follow up  Low WBC Recent WBC 3.8.  Explained to [facility] that the lower limit of normal for WBC in African Americans is 3.5 instead of 4.  Provided reassurance about Her WBC decrease. -Repeat lab in 5 months with brief discussion after to review  RTC as noted above.  Patient  verbalized understanding and is in agreement with the plan.     The patient was provided an opportunity to ask questions and all were answered. The patient agreed with the plan and demonstrated an understanding of the instructions.   The patient was advised to call back or seek an in-person evaluation if the symptoms worsen or if the condition fails to improve as anticipated.   I provided 10 minutes of non face-to-face telephone visit time during this encounter, and > 50% was spent counseling as documented under my assessment & plan.   Joann Kendall, NP 05/28/24 12:48 PM Medical Oncology and Hematology [facility] [address] [place], [place] [address] Tel. [phone]    Fax. [phone]  *Total Encounter Time as defined by the Centers for Medicare and Medicaid Services includes, in addition to the face-to-face time of a patient visit (documented in the note above) non-face-to-face time: obtaining and reviewing outside history, ordering and reviewing medications, tests or procedures, care coordination (communications with other health care professionals or caregivers) and documentation in the medical record.

## 2024-05-28 NOTE — Assessment & Plan Note (Signed)
 Joann Page is a 63 year old woman with history of stage Ia left-sided breast cancer that was estrogen positive diagnosed in August 2013.  She is status post lumpectomy followed by adjuvant chemotherapy followed by adjuvant radiation, followed by adjuvant tamoxifen  x 7 years that she completed in 2022.  Breast Cancer (History) No evidence of malignancy on most recent mammogram (August 22, 2023). -Continue annual mammograms. -Continue long term follow up  Low WBC Recent WBC 3.8.  Explained to [facility] that the lower limit of normal for WBC in African Americans is 3.5 instead of 4.  Provided reassurance about Her WBC decrease. -Repeat lab in 5 months with brief discussion after to review  RTC as noted above.  Patient verbalized understanding and is in agreement with the plan.

## 2024-06-18 NOTE — Progress Notes (Deleted)
 Subjective:    Patient ID: Joann Page, female    DOB: [birth year], 63 y.o.   MRN: [id number]  HPI   Pain Inventory Average Pain {NUMBERS; 0-10:5044} Pain Right Now {NUMBERS; 0-10:5044} My pain is {PAIN DESCRIPTION:21022940}  In the last 24 hours, has pain interfered with the following? General activity {NUMBERS; 0-10:5044} Relation with others {NUMBERS; 0-10:5044} Enjoyment of life {NUMBERS; 0-10:5044} What TIME of day is your pain at its worst? {time of day:24191} Sleep (in general) {BHH GOOD/FAIR/POOR:22877}  Pain is worse with: {ACTIVITIES:21022942} Pain improves with: {PAIN IMPROVES TPUY:78977056} Relief from Meds: {NUMBERS; 0-10:5044}  Family History  Problem Relation Age of Onset   Colon cancer Father 25   Alcohol abuse Father    Colon polyps Brother    Anxiety disorder Brother    Depression Brother    Breast cancer Sister 47   Obesity Sister    Anxiety disorder Sister    Depression Sister    Anxiety disorder Mother    Depression Mother    Bipolar disorder Mother    Lymphoma Brother    Drug abuse Brother    Anxiety disorder Sister    Depression Sister    Bipolar disorder Sister    Esophageal cancer Neg Hx    Stomach cancer Neg Hx    Rectal cancer Neg Hx    Social History   Socioeconomic History   Marital status: Divorced    Spouse name: Not on file   Number of children: 1   Years of education: Not on file   Highest education level: Associate degree: occupational, Scientist, product/process development, or vocational program  Occupational History   Occupation: Chief Financial Officer    CommentHydrologist  Tobacco Use   Smoking status: Never   Smokeless tobacco: Never  Vaping Use   Vaping status: Never Used  Substance and Sexual Activity   Alcohol use: No   Drug use: Yes    Comment: have drinks monthly   Sexual activity: Yes  Other Topics Concern   Not on file  Social History Narrative   Married for 20+ years, divorced Dec 2017   Living on own      Social Drivers of Health    Financial Resource Strain: High Risk (05/27/2018)   Overall Financial Resource Strain (CARDIA)    Difficulty of Paying Living Expenses: Hard  Food Insecurity: Food Insecurity Present (05/27/2018)   Hunger Vital Sign    Worried About Running Out of Food in the Last Year: Often true    Ran Out of Food in the Last Year: Often true  Transportation Needs: No Transportation Needs (05/27/2018)   PRAPARE - Administrator, Civil Service (Medical): No    Lack of Transportation (Non-Medical): No  Physical Activity: Inactive (05/27/2018)   Exercise Vital Sign    Days of Exercise per Week: 0 days    Minutes of Exercise per Session: 0 min  Stress: Stress Concern Present (05/27/2018)   [employer] of Occupational Health - Occupational Stress Questionnaire    Feeling of Stress : Rather much  Social Connections: Socially Isolated (05/27/2018)   Social Connection and Isolation Panel    Frequency of Communication with Friends and Family: Never    Frequency of Social Gatherings with Friends and Family: Never    Attends Religious Services: Never    Database administrator or Organizations: No    Attends Banker Meetings: Never    Marital Status: Separated   Past Surgical History:  Procedure Laterality Date  ABDOMINAL HYSTERECTOMY  04/2000   partial, fibroids   BREAST LUMPECTOMY  07/17/2012   Left Breast: Invasive ductal Cracinoma, 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   BREATH TEK H PYLORI  12/07/2011   Procedure: BREATH TEK H PYLORI;  Surgeon: Alm VEAR Angle, MD;  Location: [facility];  Service: Endoscopy;  Laterality: N/A;   COLONOSCOPY  08/25/2016   Dr.Nandigam   Needle Core Biopsy  06/30/2012   Left Breast: 12 O'clock/ Benign Parenchyma   Past Medical History:  Diagnosis Date   Anxiety    Arthritis 2010   lumbar spine   Breast cancer (HCC) 07/17/12   at age 73, ER/PR +, hER 2 -   Cancer (HCC) 07/07/12   Left Breast Inv Ductal Ca.   Degenerative disk disease 2010   Lumbar spine   Depression    GERD (gastroesophageal reflux disease)    hx   H/O hiatal hernia    small   Hyperlipidemia    Knee pain, right    No pertinent past medical history    Obesity    S/P radiation therapy 11/30/12 - 01/12/13   Left Breast / 50 gray in 25 Fractions with boost 10 gray in 5 Fractions   Status post chemotherapy comp 10/27/12    4 CyclesTaxotere/ Cytoxan    Status post chemotherapy Comp. 01/12/13   neoadjuvant chemotherapy: 4 Cycles of Taxotere /Cytoxan    Use of tamoxifen  (Nolvadex ) 01/18/13   There were no vitals taken for this visit.  Opioid Risk Score:   Fall Risk Score:  `1  Depression screen [facility] 2/9     05/02/2024   10:56 AM 04/11/2024    9:06 AM 03/01/2024   12:58 PM 02/01/2024   12:50 PM 01/03/2024    1:20 PM 12/07/2023    2:00 PM 12/06/2023    1:47 PM  Depression screen PHQ 2/9  Decreased Interest 0 0 0 0 0 0 0  Down, Depressed, Hopeless 0 0 0 0 0 0 0  PHQ - 2 Score 0 0 0 0 0 0 0  Altered sleeping  0       Tired, decreased energy  0       Change in appetite  0       Feeling bad or failure about yourself   0        Trouble concentrating  0       Moving slowly or fidgety/restless  0       Suicidal thoughts  0       PHQ-9 Score  0       Difficult doing work/chores  Not difficult at all  Review of Systems     Objective:   Physical Exam        Assessment & Plan:

## 2024-06-19 ENCOUNTER — Encounter: Admitting: Registered Nurse

## 2024-07-04 NOTE — Progress Notes (Unsigned)
 Subjective:    Patient ID: Joann Page, female    DOB: 05/23/1961, 63 y.o.   MRN: [id number]  HPI: [name] is a 63 y.o. female who returns for follow up appointment for chronic pain and medication refill. She states her pain is located in her lower back. She rates her pain 8. Her current exercise regime is walking and performing stretching exercises.  [name] Morphine  equivalent is 40.00 MME.   Oral Swab was Ordered today.      Pain Inventory Average Pain 6 Pain Right Now 8 My pain is constant, sharp, dull, tingling, and aching  In the last 24 hours, has pain interfered with the following? General activity 6 Relation with others 0 Enjoyment of life 0 What TIME of day is your pain at its worst? morning  Sleep (in general) Good  Pain is worse with: bending, inactivity, and standing Pain improves with: heat/ice, medication, and TENS Relief from Meds: 8  Family History  Problem Relation Age of Onset   Colon cancer Father 78   Alcohol abuse Father    Colon polyps Brother    Anxiety disorder Brother    Depression Brother    Breast cancer Sister 23   Obesity Sister    Anxiety disorder Sister    Depression Sister    Anxiety disorder Mother    Depression Mother    Bipolar disorder Mother    Lymphoma Brother    Drug abuse Brother    Anxiety disorder Sister    Depression Sister    Bipolar disorder Sister    Esophageal cancer Neg Hx    Stomach cancer Neg Hx    Rectal cancer Neg Hx    Social History   Socioeconomic History   Marital status: Divorced    Spouse name: Not on file   Number of children: 1   Years of education: Not on file   Highest education level: Associate degree: occupational, Scientist, product/process development, or vocational program  Occupational History   Occupation: Chief Financial Officer    CommentHydrologist  Tobacco Use   Smoking status: Never   Smokeless tobacco: Never  Vaping Use   Vaping status: Never Used  Substance and Sexual Activity   Alcohol use: No   Drug use:  Yes    Comment: have drinks monthly   Sexual activity: Yes  Other Topics Concern   Not on file  Social History Narrative   Married for 20+ years, divorced Dec 2017   Living on own      Social Drivers of Health   Financial Resource Strain: High Risk (05/27/2018)   Overall Financial Resource Strain (CARDIA)    Difficulty of Paying Living Expenses: Hard  Food Insecurity: Food Insecurity Present (05/27/2018)   Hunger Vital Sign    Worried About Running Out of Food in the Last Year: Often true    Ran Out of Food in the Last Year: Often true  Transportation Needs: No Transportation Needs (05/27/2018)   PRAPARE - Administrator, Civil Service (Medical): No    Lack of Transportation (Non-Medical): No  Physical Activity: Inactive (05/27/2018)   Exercise Vital Sign    Days of Exercise per Week: 0 days    Minutes of Exercise per Session: 0 min  Stress: Stress Concern Present (05/27/2018)   [employer] of Occupational Health - Occupational Stress Questionnaire    Feeling of Stress : Rather much  Social Connections: Socially Isolated (05/27/2018)   Social Connection and Isolation Panel  Frequency of Communication with Friends and Family: Never    Frequency of Social Gatherings with Friends and Family: Never    Attends Religious Services: Never    Database administrator or Organizations: No    Attends Engineer, structural: Never    Marital Status: Separated   Past Surgical History:  Procedure Laterality Date   ABDOMINAL HYSTERECTOMY  04/2000   partial, fibroids   BREAST LUMPECTOMY  07/17/2012   Left Breast: Invasive ductal Cracinoma, 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   BREATH TEK H PYLORI  12/07/2011   Procedure: BREATH TEK H PYLORI;  Surgeon: Alm VEAR Angle, MD;  Location: [facility];  Service: Endoscopy;  Laterality: N/A;   COLONOSCOPY  08/25/2016   Dr.Nandigam   Needle Core Biopsy  06/30/2012   Left Breast: 12 O'clock/ Benign Parenchyma   Past Medical History:  Diagnosis Date   Anxiety    Arthritis 2010   lumbar spine   Breast cancer (HCC) 07/17/12   at age 83, ER/PR +, hER 2 -   Cancer (HCC) 07/07/12   Left Breast Inv Ductal Ca.   Degenerative disk disease 2010   Lumbar spine   Depression    GERD (gastroesophageal reflux disease)    hx   H/O hiatal hernia    small   Hyperlipidemia    Knee pain, right    No pertinent past medical history    Obesity    S/P radiation therapy 11/30/12 - 01/12/13   Left Breast / 50 gray in 25 Fractions with boost 10 gray in 5 Fractions   Status post chemotherapy comp 10/27/12    4 CyclesTaxotere/ Cytoxan    Status post chemotherapy Comp. 01/12/13   neoadjuvant chemotherapy: 4 Cycles of Taxotere /Cytoxan    Use of tamoxifen  (Nolvadex ) 01/18/13   There were no vitals taken for this visit.  Opioid Risk Score:   Fall Risk Score:  `1  Depression screen [facility] 2/9     05/02/2024   10:56 AM 04/11/2024    9:06 AM 03/01/2024   12:58 PM 02/01/2024   12:50 PM 01/03/2024    1:20 PM 12/07/2023    2:00 PM 12/06/2023    1:47 PM  Depression screen PHQ 2/9  Decreased Interest 0 0 0 0 0 0 0   Down, Depressed, Hopeless 0 0 0 0 0 0 0  PHQ - 2 Score 0 0 0 0 0 0 0  Altered sleeping  0       Tired, decreased energy  0       Change in appetite  0       Feeling bad or  failure about yourself   0       Trouble concentrating  0       Moving slowly or fidgety/restless  0       Suicidal thoughts  0       PHQ-9 Score  0       Difficult doing work/chores  Not difficult at all         Review of Systems  Musculoskeletal:  Positive for back pain.  All other systems reviewed and are negative.      Objective:   Physical Exam Vitals and nursing note reviewed.  Constitutional:      Appearance: Normal appearance.  Cardiovascular:     Rate and Rhythm: Normal rate and regular rhythm.     Pulses: Normal pulses.     Heart sounds: Normal heart sounds.  Pulmonary:     Effort: Pulmonary effort is normal.     Breath sounds: Normal breath sounds.  Musculoskeletal:     Comments: Normal Muscle Bulk and Muscle Testing Reveals:  Upper Extremities: Full ROM and Muscle Strength 5/5  Lumbar Paraspinal Tenderness: L-4-L-5 Lower Extremities: Full ROM and Muscle Strength 5/5 Arises from chair with ease Narrow Based  Gait     Skin:    General: Skin is warm and dry.  Neurological:     Mental Status: She is alert and oriented to person, place, and time.  Psychiatric:        Mood and Affect: Mood normal.        Behavior: Behavior normal.          Assessment & Plan:  Lumbar Spinal Stenosis/ Right Lumbar Radiculitis: Continue HEP as Tolerated.  She reports she seen Dr Clois ide will continue to monitor. 07/05/2024. Chronic Bilateral Thoracic Back Pain: Continue HEP . Continue current medication regimen. Continue to monitor. 07/05/2024.  Chronic Pain Syndrome: Refilled: Hydrocodone  10/325 mg one tablet 4 times a day as needed for pain #120. We will continue the opioid monitoring program, this consists of regular clinic visits, examinations, urine drug screen, pill counts as well as use of  [place]  Controlled Substance Reporting system. A 12 month History has been reviewed on the [place]  Controlled Substance Reporting System on 07/05/2024.    F/U in 1 month

## 2024-07-05 ENCOUNTER — Encounter: Attending: Physical Medicine & Rehabilitation | Admitting: Registered Nurse

## 2024-07-05 ENCOUNTER — Encounter: Payer: Self-pay | Admitting: Registered Nurse

## 2024-07-05 VITALS — BP 137/91 | HR 83 | Ht 66.0 in

## 2024-07-05 DIAGNOSIS — Z79891 Long term (current) use of opiate analgesic: Secondary | ICD-10-CM | POA: Diagnosis present

## 2024-07-05 DIAGNOSIS — G894 Chronic pain syndrome: Secondary | ICD-10-CM | POA: Diagnosis present

## 2024-07-05 DIAGNOSIS — M48061 Spinal stenosis, lumbar region without neurogenic claudication: Secondary | ICD-10-CM | POA: Insufficient documentation

## 2024-07-05 DIAGNOSIS — Z5181 Encounter for therapeutic drug level monitoring: Secondary | ICD-10-CM | POA: Diagnosis present

## 2024-07-05 MED ORDER — HYDROCODONE-ACETAMINOPHEN 10-325 MG PO TABS: 1.0000 | ORAL_TABLET | Freq: Four times a day (QID) | ORAL | 0 refills | Status: DC | PRN

## 2024-07-09 LAB — DRUG TOX MONITOR 1 W/CONF, ORAL FLD
Amphetamines: NEGATIVE ng/mL (ref <10)
Barbiturates: NEGATIVE ng/mL (ref <10)
Benzodiazepines: NEGATIVE ng/mL (ref <0.50)
Buprenorphine: NEGATIVE ng/mL (ref <0.10)
Cocaine: NEGATIVE ng/mL (ref <5.0)
Codeine: NEGATIVE ng/mL (ref <2.5)
Dihydrocodeine: 2.5 ng/mL — ABNORMAL HIGH (ref <2.5)
Fentanyl: NEGATIVE ng/mL (ref <0.10)
Heroin Metabolite: NEGATIVE ng/mL (ref <1.0)
Hydrocodone: 10.9 ng/mL — ABNORMAL HIGH (ref <2.5)
Hydromorphone: NEGATIVE ng/mL (ref <2.5)
MARIJUANA: NEGATIVE ng/mL (ref <2.5)
MDMA: NEGATIVE ng/mL (ref <10)
Meprobamate: NEGATIVE ng/mL (ref <2.5)
Methadone: NEGATIVE ng/mL (ref <5.0)
Morphine: NEGATIVE ng/mL (ref <2.5)
Nicotine Metabolite: NEGATIVE ng/mL (ref <5.0)
Norhydrocodone: NEGATIVE ng/mL (ref <2.5)
Noroxycodone: NEGATIVE ng/mL (ref <2.5)
Opiates: POSITIVE ng/mL — AB (ref <2.5)
Oxycodone: NEGATIVE ng/mL (ref <2.5)
Oxymorphone: NEGATIVE ng/mL (ref <2.5)
Phencyclidine: NEGATIVE ng/mL (ref <10)
Tapentadol: NEGATIVE ng/mL (ref <5.0)
Tramadol: NEGATIVE ng/mL (ref <5.0)
Zolpidem: NEGATIVE ng/mL (ref <5.0)

## 2024-07-09 LAB — DRUG TOX ALC METAB W/CON, ORAL FLD: Alcohol Metabolite: NEGATIVE ng/mL (ref <25)

## 2024-07-31 NOTE — Progress Notes (Signed)
 Subjective:    Patient ID: Joann Page, female    DOB: 1961/11/03, 63 y.o.   MRN: [id number]  HPI: [name] is a 63 y.o. female who returns for follow up appointment for chronic pain and medication refill. She states her pain is located in her lower back. She rates her pain 6. Her current exercise regime is walking and performing stretching exercises.  [name] Morphine  equivalent is 40.00 MME.   Last oral swab was performed on 07/05/2024, it was consistent.   Pain Inventory Average Pain 6 Pain Right Now 6 My pain is intermittent, constant, sharp, burning, dull, stabbing, tingling, and aching  In the last 24 hours, has pain interfered with the following? General activity 1 Relation with others 0 Enjoyment of life 0 What TIME of day is your pain at its worst? morning  and night Sleep (in general) Good  Pain is worse with: bending, inactivity, and standing Pain improves with: rest and TENS Relief from Meds: 9  Family History  Problem Relation Age of Onset   Colon cancer Father 82   Alcohol abuse Father    Colon polyps Brother    Anxiety disorder Brother    Depression Brother    Breast cancer Sister 95   Obesity Sister    Anxiety disorder Sister    Depression Sister    Anxiety disorder Mother    Depression Mother    Bipolar disorder Mother    Lymphoma Brother    Drug abuse Brother    Anxiety disorder Sister    Depression Sister    Bipolar disorder Sister    Esophageal cancer Neg Hx    Stomach cancer Neg Hx    Rectal cancer Neg Hx    Social History   Socioeconomic History   Marital status: Divorced    Spouse name: Not on file   Number of children: 1   Years of education: Not on file   Highest education level: Associate degree: occupational, Scientist, product/process development, or vocational program  Occupational History   Occupation: Chief Financial Officer    CommentHydrologist  Tobacco Use   Smoking status: Never   Smokeless tobacco: Never  Vaping Use   Vaping status: Never Used   Substance and Sexual Activity   Alcohol use: No   Drug use: Yes    Comment: have drinks monthly   Sexual activity: Yes  Other Topics Concern   Not on file  Social History Narrative   Married for 20+ years, divorced Dec 2017   Living on own      Social Drivers of Health   Financial Resource Strain: High Risk (05/27/2018)   Overall Financial Resource Strain (CARDIA)    Difficulty of Paying Living Expenses: Hard  Food Insecurity: Food Insecurity Present (05/27/2018)   Hunger Vital Sign    Worried About Running Out of Food in the Last Year: Often true    Ran Out of Food in the Last Year: Often true  Transportation Needs: No Transportation Needs (05/27/2018)   PRAPARE - Administrator, Civil Service (Medical): No    Lack of Transportation (Non-Medical): No  Physical Activity: Inactive (05/27/2018)   Exercise Vital Sign    Days of Exercise per Week: 0 days    Minutes of Exercise per Session: 0 min  Stress: Stress Concern Present (05/27/2018)   [employer] of Occupational Health - Occupational Stress Questionnaire    Feeling of Stress : Rather much  Social Connections: Socially Isolated (05/27/2018)   Social  Connection and Isolation Panel    Frequency of Communication with Friends and Family: Never    Frequency of Social Gatherings with Friends and Family: Never    Attends Religious Services: Never    Database administrator or Organizations: No    Attends Engineer, structural: Never    Marital Status: Separated   Past Surgical History:  Procedure Laterality Date   ABDOMINAL HYSTERECTOMY  04/2000   partial, fibroids   BREAST LUMPECTOMY  07/17/2012   Left Breast: Invasive ductal Cracinoma, 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   BREATH TEK H PYLORI  12/07/2011   Procedure: BREATH TEK H PYLORI;  Surgeon: Alm VEAR Angle, MD;  Location: [facility];  Service: Endoscopy;  Laterality: N/A;   COLONOSCOPY  08/25/2016   Dr.Nandigam   Needle Core Biopsy  06/30/2012   Left Breast: 12 O'clock/ Benign Parenchyma   Past Medical History:  Diagnosis Date   Anxiety    Arthritis 2010   lumbar spine   Breast cancer (HCC) 07/17/12   at age 72, ER/PR +, hER 2 -   Cancer (HCC) 07/07/12   Left Breast Inv Ductal Ca.   Degenerative disk disease 2010   Lumbar spine   Depression    GERD (gastroesophageal reflux disease)    hx   H/O hiatal hernia    small   Hyperlipidemia    Knee pain, right    No pertinent past medical history    Obesity    S/P radiation therapy 11/30/12 - 01/12/13   Left Breast / 50 gray in 25 Fractions with boost 10 gray in 5 Fractions   Status post chemotherapy comp 10/27/12    4 CyclesTaxotere/ Cytoxan    Status post chemotherapy Comp. 01/12/13   neoadjuvant chemotherapy: 4 Cycles of Taxotere /Cytoxan    Use of tamoxifen  (Nolvadex ) 01/18/13   There were no vitals taken for this visit.  Opioid Risk Score:   Fall Risk Score:  `1  Depression screen [facility] 2/9     07/05/2024   10:51 AM 05/02/2024   10:56 AM 04/11/2024    9:06 AM 03/01/2024   12:58 PM 02/01/2024   12:50 PM 01/03/2024    1:20 PM 12/07/2023    2:00 PM   Depression screen PHQ 2/9  Decreased Interest 0 0 0 0 0 0 0  Down, Depressed, Hopeless 0 0 0 0 0 0 0  PHQ - 2 Score 0 0 0 0 0 0 0  Altered sleeping   0      Tired, decreased energy   0      Change in appetite   0  Feeling bad or failure about yourself    0      Trouble concentrating   0      Moving slowly or fidgety/restless   0      Suicidal thoughts   0      PHQ-9 Score   0      Difficult doing work/chores   Not difficult at all        Review of Systems  Musculoskeletal:  Positive for back pain.  All other systems reviewed and are negative.      Objective:   Physical Exam Vitals and nursing note reviewed.  Constitutional:      Appearance: Normal appearance.  Cardiovascular:     Rate and Rhythm: Normal rate and regular rhythm.     Pulses: Normal pulses.     Heart sounds: Normal heart sounds.  Pulmonary:     Effort: Pulmonary effort is normal.     Breath sounds: Normal breath sounds.  Musculoskeletal:     Comments: Normal Muscle Bulk and Muscle Testing Reveals:  Upper Extremities: Full ROM and Muscle Strength 5/5 Lumbar Paraspinal Tenderness: L-4-L-5 Lower Extremities: Full ROM and Muscle Strength 5/5 Arises from chair with ease Narrow Based  Gait     Skin:    General: Skin is warm and dry.  Neurological:     Mental Status: She is alert and oriented to person, place, and time.  Psychiatric:        Mood and Affect: Mood normal.        Behavior: Behavior normal.          Assessment & Plan:  Lumbar Spinal Stenosis/ Right Lumbar Radiculitis: Continue HEP as Tolerated.  She reports she seen Dr Clois ide will continue to monitor. 07/31/2024. Chronic Bilateral Thoracic Back Pain: Continue HEP . Continue current medication regimen. Continue to monitor. 07/31/2024.  Chronic Pain Syndrome: Refilled: Hydrocodone  10/325 mg one tablet 4 times a day as needed for pain #120. We will continue the opioid monitoring program, this consists of regular clinic visits,  examinations, urine drug screen, pill counts as well as use of [place]  Controlled Substance Reporting system. A 12 month History has been reviewed on the [place]  Controlled Substance Reporting System on 07/31/2024.    F/U in 1 month

## 2024-08-01 ENCOUNTER — Encounter: Attending: Physical Medicine & Rehabilitation | Admitting: Registered Nurse

## 2024-08-01 ENCOUNTER — Encounter: Payer: Self-pay | Admitting: Registered Nurse

## 2024-08-01 VITALS — BP 138/88 | HR 73 | Ht 66.0 in

## 2024-08-01 DIAGNOSIS — Z5181 Encounter for therapeutic drug level monitoring: Secondary | ICD-10-CM

## 2024-08-01 DIAGNOSIS — M48061 Spinal stenosis, lumbar region without neurogenic claudication: Secondary | ICD-10-CM

## 2024-08-01 DIAGNOSIS — Z79891 Long term (current) use of opiate analgesic: Secondary | ICD-10-CM | POA: Diagnosis present

## 2024-08-01 DIAGNOSIS — G894 Chronic pain syndrome: Secondary | ICD-10-CM | POA: Diagnosis present

## 2024-08-01 MED ORDER — HYDROCODONE-ACETAMINOPHEN 10-325 MG PO TABS: 1.0000 | ORAL_TABLET | Freq: Four times a day (QID) | ORAL | 0 refills | Status: DC | PRN

## 2024-08-22 LAB — HM MAMMOGRAPHY

## 2024-08-23 ENCOUNTER — Encounter: Payer: Self-pay | Admitting: Internal Medicine

## 2024-09-18 NOTE — Progress Notes (Unsigned)
 Subjective:    Patient ID: Joann Page, female    DOB: 05-02-1961, 63 y.o.   MRN: [id number]  HPI: Joann Page is a 63 y.o. female who returns for follow up appointment for chronic pain and medication refill. Her pain is located in her mid- lower back and reports occasionally it radiates into her right lower extremity. She rates her pain 2. Her current exercise regime is walking and performing stretching exercises.  Joann Page asked about spinal stimulator, this will be discussed with Dr Carilyn, she verbalizes understanding.   Joann Page Morphine  equivalent is 40.00 MME.   Last oral swab was performed on 07/05/2024, it was consistent.      Pain Inventory Average Pain 9 Pain Right Now 2 My pain is constant, sharp, burning, dull, stabbing, tingling, and aching  In the last 24 hours, has pain interfered with the following? General activity 0 Relation with others 0 Enjoyment of life 0 What TIME of day is your pain at its worst? morning  Sleep (in general) Good  Pain is worse with: bending and inactivity Pain improves with: rest, heat/ice, therapy/exercise, medication, and TENS Relief from Meds: 9  Family History  Problem Relation Age of Onset   Colon cancer Father 46   Alcohol abuse Father    Colon polyps Brother    Anxiety disorder Brother    Depression Brother    Breast cancer Sister 33   Obesity Sister    Anxiety disorder Sister    Depression Sister    Anxiety disorder Mother    Depression Mother    Bipolar disorder Mother    Lymphoma Brother    Drug abuse Brother    Anxiety disorder Sister    Depression Sister    Bipolar disorder Sister    Esophageal cancer Neg Hx    Stomach cancer Neg Hx    Rectal cancer Neg Hx    Social History   Socioeconomic History   Marital status: Divorced    Spouse name: Not on file   Number of children: 1   Years of education: Not on file   Highest education level: Associate degree: occupational, Scientist, product/process development, or vocational  program  Occupational History   Occupation: Chief Financial Officer    CommentHydrologist  Tobacco Use   Smoking status: Never   Smokeless tobacco: Never  Vaping Use   Vaping status: Never Used  Substance and Sexual Activity   Alcohol use: No   Drug use: Yes    Comment: have drinks monthly   Sexual activity: Yes  Other Topics Concern   Not on file  Social History Narrative   Married for 20+ years, divorced Dec 2017   Living on own      Social Drivers of Health   Financial Resource Strain: High Risk (05/27/2018)   Overall Financial Resource Strain (CARDIA)    Difficulty of Paying Living Expenses: Hard  Food Insecurity: Food Insecurity Present (05/27/2018)   Hunger Vital Sign    Worried About Running Out of Food in the Last Year: Often true    Ran Out of Food in the Last Year: Often true  Transportation Needs: No Transportation Needs (05/27/2018)   PRAPARE - Administrator, Civil Service (Medical): No    Lack of Transportation (Non-Medical): No  Physical Activity: Inactive (05/27/2018)   Exercise Vital Sign    Days of Exercise per Week: 0 days    Minutes of Exercise per Session: 0 min  Stress: Stress Concern Present (05/27/2018)  [employer] of Occupational Health - Occupational Stress Questionnaire    Feeling of Stress : Rather much  Social Connections: Socially Isolated (05/27/2018)   Social Connection and Isolation Panel    Frequency of Communication with Friends and Family: Never    Frequency of Social Gatherings with Friends and Family: Never    Attends Religious Services: Never    Database administrator or Organizations: No    Attends Engineer, structural: Never    Marital Status: Separated   Past Surgical History:  Procedure Laterality Date   ABDOMINAL HYSTERECTOMY  04/2000   partial, fibroids   BREAST LUMPECTOMY  07/17/2012   Left Breast: Invasive ductal Cracinoma, 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   BREATH TEK H PYLORI  12/07/2011   Procedure: BREATH TEK H PYLORI;  Surgeon: Alm VEAR Angle, MD;  Location: [facility];  Service: Endoscopy;  Laterality: N/A;   COLONOSCOPY  08/25/2016   Dr.Nandigam   Needle Core Biopsy  06/30/2012   Left Breast: 12 O'clock/ Benign Parenchyma   Past Medical History:  Diagnosis Date   Anxiety    Arthritis 2010   lumbar spine   Breast cancer (HCC) 07/17/2012   at age 53, ER/PR +, hER 2 -   Cancer (HCC) 07/07/2012   Left Breast Inv Ductal Ca.   Degenerative disk disease 2010   Lumbar spine   Depression    GERD (gastroesophageal reflux disease)    hx   H/O hiatal hernia    small   Hyperlipidemia    Knee pain, right    No pertinent past medical history    Obesity    S/P radiation therapy 11/30/12 - 01/12/13   Left Breast / 50 gray in 25 Fractions with boost 10 gray in 5 Fractions   Status post chemotherapy comp 10/27/12    4 CyclesTaxotere/ Cytoxan    Status post chemotherapy Comp. 01/12/13   neoadjuvant chemotherapy: 4 Cycles of Taxotere /Cytoxan    Use of tamoxifen  (Nolvadex ) 01/18/2013   There were no vitals taken for this visit.  Opioid Risk Score:   Fall Risk  Score:  `1  Depression screen [facility] 2/9     08/01/2024   10:55 AM 07/05/2024   10:51 AM 05/02/2024   10:56 AM 04/11/2024    9:06 AM 03/01/2024   12:58 PM 02/01/2024   12:50 PM 01/03/2024    1:20 PM  Depression screen PHQ 2/9  Decreased Interest 0 0 0 0 0 0 0  Down, Depressed, Hopeless 0 0 0 0 0 0 0  PHQ - 2 Score 0 0 0 0 0 0 0  Altered sleeping  0     Tired, decreased energy    0     Change in appetite    0     Feeling bad or failure about yourself     0     Trouble concentrating    0     Moving slowly or fidgety/restless    0     Suicidal thoughts    0     PHQ-9 Score    0     Difficult doing work/chores    Not difficult at all       Review of Systems  Musculoskeletal:  Positive for back pain.  All other systems reviewed and are negative.      Objective:   Physical Exam Vitals and nursing note reviewed.  Constitutional:      Appearance: Normal appearance.  Cardiovascular:     Rate and Rhythm: Normal rate and regular rhythm.     Pulses: Normal pulses.     Heart sounds: Normal heart sounds.  Pulmonary:     Effort: Pulmonary effort is normal.     Breath sounds: Normal breath sounds.  Musculoskeletal:     Comments: Normal Muscle Bulk and Muscle Testing Reveals:  Lumbar Paraspinal Tenderness: L-4-L-5 Lower Extremities: Full ROM and Muscle Strength 5/5 Arises from Chair with ease Narrow Based  Gait     Skin:    General: Skin is warm and dry.  Neurological:     Mental Status: She is alert and oriented to person, place, and time.  Psychiatric:        Mood and Affect: Mood normal.        Behavior: Behavior normal.          Assessment & Plan:  Lumbar Spinal Stenosis/ Right Lumbar Radiculitis: Continue HEP as Tolerated.  She reports she seen Dr Clois ide will continue to monitor. 09/18/2024. Chronic Bilateral Thoracic Back Pain: Continue HEP . Continue current medication regimen. Continue to monitor. 09/18/2024.  Chronic Pain Syndrome: Continue  Hydrocodone  10/325 mg  one tablet 4 times a day as needed for pain #120. We will continue the opioid monitoring program, this consists of regular clinic visits, examinations, urine drug screen, pill counts as well as use of [place]  Controlled Substance Reporting system. A 12 month History has been reviewed on the [place]  Controlled Substance Reporting System on 09/18/2024.    F/U in 1 month

## 2024-09-19 ENCOUNTER — Encounter: Attending: Physical Medicine & Rehabilitation | Admitting: Registered Nurse

## 2024-09-19 ENCOUNTER — Encounter: Payer: Self-pay | Admitting: Registered Nurse

## 2024-09-19 VITALS — BP 147/98 | HR 76 | Ht 66.0 in | Wt 180.0 lb

## 2024-09-19 DIAGNOSIS — Z5181 Encounter for therapeutic drug level monitoring: Secondary | ICD-10-CM | POA: Insufficient documentation

## 2024-09-19 DIAGNOSIS — M48061 Spinal stenosis, lumbar region without neurogenic claudication: Secondary | ICD-10-CM | POA: Insufficient documentation

## 2024-09-19 DIAGNOSIS — Z79891 Long term (current) use of opiate analgesic: Secondary | ICD-10-CM | POA: Diagnosis present

## 2024-09-19 DIAGNOSIS — M546 Pain in thoracic spine: Secondary | ICD-10-CM | POA: Insufficient documentation

## 2024-09-19 DIAGNOSIS — G8929 Other chronic pain: Secondary | ICD-10-CM | POA: Diagnosis present

## 2024-09-19 DIAGNOSIS — G894 Chronic pain syndrome: Secondary | ICD-10-CM | POA: Insufficient documentation

## 2024-09-19 DIAGNOSIS — M5416 Radiculopathy, lumbar region: Secondary | ICD-10-CM | POA: Diagnosis not present

## 2024-09-24 ENCOUNTER — Ambulatory Visit: Admitting: Dermatology

## 2024-09-24 ENCOUNTER — Other Ambulatory Visit: Payer: Self-pay | Admitting: Medical Genetics

## 2024-09-24 ENCOUNTER — Encounter: Payer: Self-pay | Admitting: Dermatology

## 2024-09-24 VITALS — BP 141/95 | HR 83

## 2024-09-24 DIAGNOSIS — B351 Tinea unguium: Secondary | ICD-10-CM | POA: Diagnosis not present

## 2024-09-24 DIAGNOSIS — L821 Other seborrheic keratosis: Secondary | ICD-10-CM

## 2024-09-24 DIAGNOSIS — Z006 Encounter for examination for normal comparison and control in clinical research program: Secondary | ICD-10-CM

## 2024-09-24 MED ORDER — JUBLIA 10 % EX SOLN: 1.0000 | Freq: Every day | CUTANEOUS | 11 refills | Status: AC

## 2024-09-24 NOTE — Patient Instructions (Addendum)
 VISIT SUMMARY:  Today, you were seen for concerns about a spot on your breast and an issue with your toenail. You mentioned a spot on your breast that appeared after your radiation treatment for breast cancer 13 years ago, and a toenail that is discolored and thickened. We discussed the nature of these issues and the appropriate treatment plans.  YOUR PLAN:  -ONYCHOMYCOSIS OF TOENAIL:  Onychomycosis is a fungal infection of the toenail that causes it to become thick, yellow, and lifted. To treat this, you will use Jublia (efinaconazole) topical solution daily for one year, making sure to apply it under the nail.  The prescription will be sent to [facility], a specialty pharmacy, for better insurance coverage. If insurance does not cover Jublia, we may consider a generic alternative, though it may be less effective.  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 They are not related to your previous breast cancer or radiation treatment.  If these spots become bothersome or you find them cosmetically undesirable, we can freeze them with liquid nitrogen. We discussed the benign nature of these growths and reassured you that they are not cancerous.  INSTRUCTIONS:  Please follow up with the pharmacy to ensure you receive your prescription for Jublia. If you have any issues with insurance coverage, contact our office to discuss alternative options. If the seborrheic keratoses become bothersome or you wish to have them removed for cosmetic reasons, please schedule an appointment for treatment.      Important Information  Due to recent changes in healthcare laws, you may see results of your pathology and/or laboratory studies on MyChart before the doctors have had a chance to review them. We understand that in some cases there may be results that are confusing or concerning to you. Please  understand that not all results are received at the same time and often the doctors may need to interpret multiple results in order to provide you with the best plan of care or course of treatment. Therefore, we ask that you please give us  2 business days to thoroughly review all your results before contacting the office for clarification. Should we see a critical lab result, you will be contacted sooner.   If You Need Anything After Your Visit  If you have any questions or concerns for your doctor, please call our main line at [phone] If no one answers, please leave a voicemail as directed and we will return your call as soon as possible. Messages left after 4 pm will be answered the following business day.   You may also send us  a message via MyChart. We typically respond to MyChart messages within 1-2 business days.  For prescription refills, please ask your pharmacy to contact our office. Our fax number is [phone].  If you have an urgent issue when the clinic is closed that cannot wait until the next business day, you can page your doctor at the number below.    Please note that while we do our best to be available for urgent issues outside of office hours, we are not available 24/7.   If you have an urgent issue and are unable to reach us , you may choose to seek medical care at your doctor's office, retail clinic, urgent care center, or emergency room.  If you have a medical emergency, please immediately call 911 or go to the emergency department. In the event of  inclement weather, please call our main line at [phone] for an update on the status of any delays or closures.  Dermatology Medication Tips: Please keep the boxes that topical medications come in in order to help keep track of the instructions about where and how to use these. Pharmacies typically print the medication instructions only on the boxes and not directly on the medication tubes.   If your medication is  too expensive, please contact our office at [phone] or send us  a message through MyChart.   We are unable to tell what your co-pay for medications will be in advance as this is different depending on your insurance coverage. However, we may be able to find a substitute medication at lower cost or fill out paperwork to get insurance to cover a needed medication.   If a prior authorization is required to get your medication covered by your insurance company, please allow us  1-2 business days to complete this process.  Drug prices often vary depending on where the prescription is filled and some pharmacies may offer cheaper prices.  The website www.goodrx.com contains coupons for medications through different pharmacies. The prices here do not account for what the cost may be with help from insurance (it may be cheaper with your insurance), but the website can give you the price if you did not use any insurance.  - You can print the associated coupon and take it with your prescription to the pharmacy.  - You may also stop by our office during regular business hours and pick up a GoodRx coupon card.  - If you need your prescription sent electronically to a different pharmacy, notify our office through [facility] or by phone at [phone]

## 2024-09-24 NOTE — Progress Notes (Signed)
   New Patient Visit   Subjective  Joann Page is a 63 y.o. female who presents for the following: Spot(s)  Patient states she has spot located at the chest and Right Great Toe that she would like to have examined. Patient reports the areas have been there for several years.  She reports the areas are not bothersome.Patient reports she has not previously been treated for these areas. Patient denies Hx of bx. Patient denies family history of skin cancer(s).  The spot on the great toe nail, Patient reports the areas have been there for several months.  She reports the areas are not bothersome. Patient reports she has not previously been treated for these areas. Patient denies Hx of bx. Patient denies family history of skin cancer(s).  The following portions of the chart were reviewed this encounter and updated as appropriate: medications, allergies, medical history  Review of Systems:  No other skin or systemic complaints except as noted in HPI or Assessment and Plan.  Objective  Well appearing patient in no apparent distress; mood and affect are within normal limits.  A focused examination was performed of the following areas: Center Chest and Right Great Toe  Relevant exam findings are noted in the Assessment and Plan.         Assessment & Plan   Onychomycosis of toenail Onychomycosis of the toenail with thick yellow discoloration and lifting. Previous toenail affected a year ago resolved spontaneously. Current toenail is growing out but requires treatment to prevent recurrence. - Prescribe Jublia (efinaconazole) topical solution to be applied daily for one year. Ensure application under the nail. - Send prescription to [facility], a specialty pharmacy, for better insurance coverage. - If insurance does not cover Jublia, consider generic alternative, though it is less effective.  Seborrheic keratoses of chest and breast region Seborrheic keratoses on the chest and breast region,  including areas near radiation markers. Benign, waxy, pigmented growths with no malignant potential. Not related to previous breast cancer or radiation treatment. No symptoms of itching or irritation reported. - Discuss option to freeze keratoses with liquid nitrogen if she becomes bothersome or cosmetically undesirable. - Provide education on the benign nature of seborrheic keratoses and reassurance regarding their non-cancerous nature.  ONYCHOMYCOSIS   Related Medications Efinaconazole (JUBLIA) 10 % SOLN Apply 1 Application topically daily.  No follow-ups on file.  I, Jetta Ager, am acting as neurosurgeon for [employer], DO.  Documentation: I have reviewed the above documentation for accuracy and completeness, and I agree with the above.  Delon Lenis, DO

## 2024-10-16 ENCOUNTER — Encounter: Payer: Self-pay | Admitting: Internal Medicine

## 2024-10-16 ENCOUNTER — Ambulatory Visit (INDEPENDENT_AMBULATORY_CARE_PROVIDER_SITE_OTHER): Admitting: Internal Medicine

## 2024-10-16 ENCOUNTER — Encounter: Attending: Physical Medicine & Rehabilitation | Admitting: Registered Nurse

## 2024-10-16 VITALS — BP 132/70 | HR 83 | Temp 98.0°F | Ht 66.0 in | Wt 183.0 lb

## 2024-10-16 DIAGNOSIS — E663 Overweight: Secondary | ICD-10-CM | POA: Diagnosis not present

## 2024-10-16 DIAGNOSIS — G894 Chronic pain syndrome: Secondary | ICD-10-CM | POA: Insufficient documentation

## 2024-10-16 DIAGNOSIS — Z79891 Long term (current) use of opiate analgesic: Secondary | ICD-10-CM | POA: Insufficient documentation

## 2024-10-16 DIAGNOSIS — Z6829 Body mass index (BMI) 29.0-29.9, adult: Secondary | ICD-10-CM

## 2024-10-16 DIAGNOSIS — Z5181 Encounter for therapeutic drug level monitoring: Secondary | ICD-10-CM | POA: Insufficient documentation

## 2024-10-16 MED ORDER — BUPROPION HCL ER (XL) 150 MG PO TB24: 150.0000 mg | ORAL_TABLET | Freq: Every day | ORAL | 1 refills | Status: AC

## 2024-10-16 MED ORDER — WEGOVY 1 MG/0.5ML ~~LOC~~ SOAJ: 1.0000 mg | SUBCUTANEOUS | 0 refills | Status: AC

## 2024-10-16 MED ORDER — WEGOVY 0.5 MG/0.5ML ~~LOC~~ SOAJ: 0.5000 mg | SUBCUTANEOUS | 0 refills | Status: AC

## 2024-10-16 MED ORDER — WEGOVY 0.25 MG/0.5ML ~~LOC~~ SOAJ: 0.2500 mg | SUBCUTANEOUS | 0 refills | Status: AC

## 2024-10-16 NOTE — Assessment & Plan Note (Signed)
 BMI is 29.5, classified as overweight, with a weight gain of 13 pounds over the past few years. Discussed weight management options, including medications and lifestyle modifications. Insurance is unlikely to cover weight loss medications due to BMI. Bupropion and topiramate are safe options for appetite reduction, suitable for breast cancer survivors. Discussed cost savings through direct-to-consumer programs for [facility] and Zepbound . Prescribed bupropion for weight management. Prescribed wegovy but advised that this will not be covered yb insurance. Advised on lifestyle modifications, including coping strategies for boredom eating and increasing physical activity.

## 2024-10-16 NOTE — Progress Notes (Signed)
   Subjective:   Patient ID: Joann Page, female    DOB: [birth year], 63 y.o.   MRN: [id number]  Discussed the use of AI scribe software for clinical note transcription with the patient, who gave verbal consent to proceed.  History of Present Illness Joann Page is a 63 year old female who presents with concerns about weight gain.  She has experienced a weight gain of 13 pounds over the past couple of years, with a more recent increase of 5 to 6 pounds since May. This is attributed to increased food intake, particularly while being sedentary at home, and a decrease in regular exercise. Her diet consists of healthy foods, but she is consuming larger quantities. Seasonal changes have also affected her fruit and vegetable intake, as she typically consumes more fruits like watermelon during the summer.     Review of Systems  Constitutional: Negative.   HENT: Negative.    Eyes: Negative.   Respiratory:  Negative for cough, chest tightness and shortness of breath.   Cardiovascular:  Negative for chest pain, palpitations and leg swelling.  Gastrointestinal:  Negative for abdominal distention, abdominal pain, constipation, diarrhea, nausea and vomiting.  Musculoskeletal: Negative.   Skin: Negative.   Neurological: Negative.   Psychiatric/Behavioral: Negative.      Objective:  Physical Exam Constitutional:      Appearance: She is well-developed.  HENT:     Head: Normocephalic and atraumatic.  Cardiovascular:     Rate and Rhythm: Normal rate and regular rhythm.  Pulmonary:     Effort: Pulmonary effort is normal. No respiratory distress.     Breath sounds: Normal breath sounds. No wheezing or rales.  Abdominal:     General: Bowel sounds are normal. There is no distension.     Palpations: Abdomen is soft.     Tenderness: There is no abdominal tenderness.  Musculoskeletal:     Cervical back: Normal range of motion.  Skin:    General: Skin is warm and dry.  Neurological:     Mental  Status: She is alert and oriented to person, place, and time.     Coordination: Coordination normal.     Vitals:   10/16/24 0935  BP: 132/70  Pulse: 83  Temp: 98 F (36.7 C)  TempSrc: Oral  SpO2: 99%  Weight: 183 lb (83 kg)  Height: 5' 6 (1.676 m)    Assessment and Plan Assessment & Plan Overweight   BMI is 29.5, classified as overweight, with a weight gain of 13 pounds over the past few years. Discussed weight management options, including medications and lifestyle modifications. Insurance is unlikely to cover weight loss medications due to BMI. Bupropion and topiramate are safe options for appetite reduction, suitable for breast cancer survivors. Discussed cost savings through direct-to-consumer programs for [facility] and Zepbound . Prescribed bupropion for weight management. Prescribed wegovy but advised that this will not be covered yb insurance. Advised on lifestyle modifications, including coping strategies for boredom eating and increasing physical activity.

## 2024-10-16 NOTE — Patient Instructions (Addendum)
 We have sent in wellbutrin to take 1 pill daily. This will help reduce food cravings and thoughts.   We have sent in wegovy the first 3 months to see if this is covered.

## 2024-10-23 ENCOUNTER — Telehealth: Payer: Self-pay | Admitting: Registered Nurse

## 2024-10-23 ENCOUNTER — Encounter: Admitting: Registered Nurse

## 2024-10-23 DIAGNOSIS — M48061 Spinal stenosis, lumbar region without neurogenic claudication: Secondary | ICD-10-CM

## 2024-10-23 DIAGNOSIS — G894 Chronic pain syndrome: Secondary | ICD-10-CM

## 2024-10-23 DIAGNOSIS — Z79891 Long term (current) use of opiate analgesic: Secondary | ICD-10-CM

## 2024-10-23 DIAGNOSIS — Z5181 Encounter for therapeutic drug level monitoring: Secondary | ICD-10-CM

## 2024-10-23 MED ORDER — HYDROCODONE-ACETAMINOPHEN 10-325 MG PO TABS: 1.0000 | ORAL_TABLET | Freq: Four times a day (QID) | ORAL | 0 refills | Status: DC | PRN

## 2024-10-23 NOTE — Telephone Encounter (Signed)
 Needs refill on hydrocodone

## 2024-10-24 ENCOUNTER — Telehealth: Payer: Self-pay

## 2024-10-24 ENCOUNTER — Telehealth: Payer: Self-pay | Admitting: Registered Nurse

## 2024-10-24 DIAGNOSIS — G894 Chronic pain syndrome: Secondary | ICD-10-CM

## 2024-10-24 DIAGNOSIS — M48061 Spinal stenosis, lumbar region without neurogenic claudication: Secondary | ICD-10-CM

## 2024-10-24 MED ORDER — HYDROCODONE-ACETAMINOPHEN 10-325 MG PO TABS: 1.0000 | ORAL_TABLET | Freq: Four times a day (QID) | ORAL | 0 refills | Status: DC | PRN

## 2024-10-24 NOTE — Telephone Encounter (Signed)
 Spoke with patient regarding obtaining updated insurance information.  Patient reports that she does not have insurance coverage and pays out of pocket for medication.  Attempted to submit PA on Cover my meds but, rec'd messages stating member not found.  Per [facility] they will not fill 30 day supply without a PA.  I currently do not have the updated insurance on file to submit a PA.  [facility] will provide [facility].  They are also reporting that it is the first time the patient is getting this medication and chart indicates that she has been on this medicine for years.  I will look into this issue further

## 2024-10-24 NOTE — Telephone Encounter (Signed)
 PDMP was Reviewed.  Hydrocodone  e-scribed to pharmacy.  Call placed to Joann Page regarding the above, she verbalizes understanding.

## 2024-10-24 NOTE — Telephone Encounter (Signed)
 Message routed to Dr. Carilyn to change Pharmacies.  Patient advised that the medication may have to be picked up at the [facility] this time and next rx will be sent to the [facility]

## 2024-11-15 ENCOUNTER — Encounter: Attending: Physical Medicine & Rehabilitation | Admitting: Registered Nurse

## 2024-11-15 ENCOUNTER — Encounter: Payer: Self-pay | Admitting: Registered Nurse

## 2024-11-15 VITALS — BP 139/94 | HR 82 | Ht 66.0 in | Wt 182.4 lb

## 2024-11-15 DIAGNOSIS — Z79891 Long term (current) use of opiate analgesic: Secondary | ICD-10-CM

## 2024-11-15 DIAGNOSIS — Z5181 Encounter for therapeutic drug level monitoring: Secondary | ICD-10-CM

## 2024-11-15 DIAGNOSIS — G894 Chronic pain syndrome: Secondary | ICD-10-CM

## 2024-11-15 DIAGNOSIS — M48061 Spinal stenosis, lumbar region without neurogenic claudication: Secondary | ICD-10-CM

## 2024-11-15 MED ORDER — HYDROCODONE-ACETAMINOPHEN 10-325 MG PO TABS: 1.0000 | ORAL_TABLET | Freq: Four times a day (QID) | ORAL | 0 refills | Status: DC | PRN

## 2024-11-15 NOTE — Progress Notes (Unsigned)
 Subjective:    Patient ID: Joann Page, female    DOB: 1961/02/01, 63 y.o.   MRN: [id number]  HPI: Joann Page is a 63 y.o. female who returns for follow up appointment for chronic pain and medication refill. states *** pain is located in  ***. rates pain ***. current exercise regime is walking and performing stretching exercises.  Ms. Canepa Morphine  equivalent is 40.00 MME.   Last Oral Swab was Performed 07/05/2024, it was consistent.    Pain Inventory Average Pain varies Pain Right Now 3 My pain is intermittent, dull, tingling, and aching  In the last 24 hours, has pain interfered with the following? General activity 0 Relation with others 0 Enjoyment of life 0 What TIME of day is your pain at its worst? morning  and night Sleep (in general) Good  Pain is worse with: bending and standing Pain improves with: heat/ice, medication, and TENS Relief from Meds: 9  Family History  Problem Relation Age of Onset   Colon cancer Father 40   Alcohol abuse Father    Colon polyps Brother    Anxiety disorder Brother    Depression Brother    Breast cancer Sister 1   Obesity Sister    Anxiety disorder Sister    Depression Sister    Anxiety disorder Mother    Depression Mother    Bipolar disorder Mother    Lymphoma Brother    Drug abuse Brother    Anxiety disorder Sister    Depression Sister    Bipolar disorder Sister    Esophageal cancer Neg Hx    Stomach cancer Neg Hx    Rectal cancer Neg Hx    Social History   Socioeconomic History   Marital status: Divorced    Spouse name: Not on file   Number of children: 1   Years of education: Not on file   Highest education level: Associate degree: occupational, scientist, product/process development, or vocational program  Occupational History   Occupation: chief financial officer    Commenthydrologist  Tobacco Use   Smoking status: Never    Passive exposure: Never   Smokeless tobacco: Never  Vaping Use   Vaping status: Never Used  Substance and Sexual Activity    Alcohol use: No   Drug use: Yes    Comment: have drinks monthly   Sexual activity: Yes  Other Topics Concern   Not on file  Social History Narrative   Married for 20+ years, divorced Dec 2017   Living on own      Social Drivers of Health   Tobacco Use: Low Risk (11/15/2024)   Patient History    Smoking Tobacco Use: Never    Smokeless Tobacco Use: Never    Passive Exposure: Never  Financial Resource Strain: Not on file  Food Insecurity: Not on file  Transportation Needs: Not on file  Physical Activity: Not on file  Stress: Not on file  Social Connections: Not on file  Depression (PHQ2-9): Low Risk (10/16/2024)   Depression (PHQ2-9)    PHQ-2 Score: 0  Alcohol Screen: Not on file  Housing: Not on file  Utilities: Not on file  Health Literacy: Not on file   Past Surgical History:  Procedure Laterality Date   ABDOMINAL HYSTERECTOMY  04/2000   partial, fibroids   BREAST LUMPECTOMY  07/17/2012   Left Breast: Invasive ductal Cracinoma, 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   BREATH TEK H PYLORI  12/07/2011   Procedure: BREATH TEK H PYLORI;  Surgeon:  Alm VEAR Angle, MD;  Location: [facility];  Service: Endoscopy;  Laterality: N/A;   COLONOSCOPY  08/25/2016   Dr.Nandigam   Needle Core Biopsy  06/30/2012   Left Breast: 12 O'clock/ Benign Parenchyma   Past Medical History:  Diagnosis Date   Anxiety    Arthritis 2010   lumbar spine   Breast cancer (HCC) 07/17/2012   at age 55, ER/PR +, hER 2 -   Cancer (HCC) 07/07/2012   Left Breast Inv Ductal Ca.   Degenerative disk disease 2010   Lumbar spine   Depression    GERD (gastroesophageal reflux disease)    hx   H/O hiatal hernia    small   Hyperlipidemia    Knee pain, right    No pertinent past medical history    Obesity    S/P radiation therapy 11/30/12 - 01/12/13   Left Breast / 50 gray in 25 Fractions with boost 10 gray in 5 Fractions   Status post chemotherapy comp 10/27/12    4 CyclesTaxotere/ Cytoxan    Status post chemotherapy Comp. 01/12/13   neoadjuvant chemotherapy: 4 Cycles of Taxotere /Cytoxan    Use of tamoxifen  (Nolvadex ) 01/18/2013   BP (!) 139/94 (BP Location: Left Arm, Patient Position: Sitting, Cuff Size: Normal)   Pulse 82   Ht 5' 6 (1.676 m)   Wt 182 lb 6.4 oz (82.7 kg)   SpO2 98%   BMI 29.44 kg/m   Opioid Risk Score:   Fall Risk Score:  `1  Depression screen [facility] 2/9     10/16/2024    9:41 AM 09/19/2024   10:57 AM 08/01/2024   10:55 AM 07/05/2024   10:51 AM 05/02/2024   10:56 AM 04/11/2024    9:06 AM 03/01/2024   12:58 PM  Depression screen PHQ 2/9  Decreased Interest 0 0 0 0 0 0 0  Down, Depressed, Hopeless 0 0 0 0 0 0 0  PHQ - 2 Score 0 0 0 0 0 0 0  Altered sleeping 0     0   Tired, decreased energy 0     0   Change in appetite 0     0   Feeling bad or failure about yourself  0     0   Trouble concentrating 0     0   Moving slowly or fidgety/restless 0     0   Suicidal thoughts 0     0   PHQ-9 Score 0     0    Difficult doing work/chores      Not difficult at all      Data saved with  a previous flowsheet row definition      Review of  Systems  Musculoskeletal:  Positive for back pain.       Low back pain  All other systems reviewed and are negative.      Objective:   Physical Exam        Assessment & Plan:  Lumbar Spinal Stenosis/ Right Lumbar Radiculitis: Continue HEP as Tolerated.  She reports she seen Dr Clois ide will continue to monitor. 09/18/2024. Chronic Bilateral Thoracic Back Pain: Continue HEP . Continue current medication regimen. Continue to monitor. 09/18/2024.  Chronic Pain Syndrome: Continue  Hydrocodone  10/325 mg one tablet 4 times a day as needed for pain #120. We will continue the opioid monitoring program, this consists of regular clinic visits, examinations, urine drug screen, pill counts as well as use of [place]  Controlled Substance Reporting system. A 12 month History has been reviewed on the [place]  Controlled Substance Reporting System on 09/18/2024.    F/U in 1 month

## 2024-11-19 ENCOUNTER — Telehealth: Payer: Self-pay

## 2024-11-19 LAB — DRUG TOX MONITOR 1 W/CONF, ORAL FLD
Amphetamines: NEGATIVE ng/mL
Barbiturates: NEGATIVE ng/mL
Benzodiazepines: NEGATIVE ng/mL
Buprenorphine: NEGATIVE ng/mL
Cocaine: NEGATIVE ng/mL
Codeine: NEGATIVE ng/mL
Dihydrocodeine: 22.2 ng/mL — ABNORMAL HIGH
Fentanyl: NEGATIVE ng/mL
Heroin Metabolite: NEGATIVE ng/mL
Hydrocodone: 119.3 ng/mL — ABNORMAL HIGH
Hydromorphone: NEGATIVE ng/mL
MARIJUANA: NEGATIVE ng/mL
MDMA: NEGATIVE ng/mL
Meprobamate: NEGATIVE ng/mL
Methadone: NEGATIVE ng/mL
Morphine: NEGATIVE ng/mL
Nicotine Metabolite: NEGATIVE ng/mL
Norhydrocodone: 11.6 ng/mL — ABNORMAL HIGH
Noroxycodone: NEGATIVE ng/mL
Opiates: POSITIVE ng/mL — AB
Oxycodone: NEGATIVE ng/mL
Oxymorphone: NEGATIVE ng/mL
Phencyclidine: NEGATIVE ng/mL
Tapentadol: NEGATIVE ng/mL
Tramadol: NEGATIVE ng/mL
Zolpidem: NEGATIVE ng/mL

## 2024-11-19 LAB — DRUG TOX ALC METAB W/CON, ORAL FLD: Alcohol Metabolite: NEGATIVE ng/mL

## 2024-11-19 NOTE — Telephone Encounter (Signed)
 Key: [id number] CORDELIA CROAK  PA Case ID #: [id number] Rx #: [id number] Approved today by [facility]  Your PA request has been approved. Effective Date: 11/19/2024 - 05/18/2025 PA#  [employer] [id number] SS Hydrocodone -Acetaminophen  10-325MG  tablets 75 MAX 7 DS PER90 DAYS THEN PA. PA REQ [id number] REQUIRES PRIOR AUTHORIZATION(PHARMACY HELP DESK [phone])

## 2024-12-20 ENCOUNTER — Encounter: Payer: Self-pay | Admitting: Registered Nurse

## 2024-12-20 ENCOUNTER — Encounter: Attending: Physical Medicine & Rehabilitation | Admitting: Registered Nurse

## 2024-12-20 VITALS — BP 115/84 | HR 95 | Ht 66.0 in | Wt 170.0 lb

## 2024-12-20 DIAGNOSIS — M48061 Spinal stenosis, lumbar region without neurogenic claudication: Secondary | ICD-10-CM | POA: Diagnosis not present

## 2024-12-20 DIAGNOSIS — M47817 Spondylosis without myelopathy or radiculopathy, lumbosacral region: Secondary | ICD-10-CM | POA: Insufficient documentation

## 2024-12-20 DIAGNOSIS — Z5181 Encounter for therapeutic drug level monitoring: Secondary | ICD-10-CM | POA: Diagnosis not present

## 2024-12-20 DIAGNOSIS — Z79891 Long term (current) use of opiate analgesic: Secondary | ICD-10-CM | POA: Insufficient documentation

## 2024-12-20 DIAGNOSIS — G894 Chronic pain syndrome: Secondary | ICD-10-CM | POA: Diagnosis not present

## 2024-12-20 MED ORDER — HYDROCODONE-ACETAMINOPHEN 10-325 MG PO TABS: 1.0000 | ORAL_TABLET | Freq: Four times a day (QID) | ORAL | 0 refills | Status: AC | PRN

## 2024-12-20 NOTE — Progress Notes (Signed)
 "  Subjective:    Patient ID: Joann Page, female    DOB: [birth year], 64 y.o.   MRN: [id number]  HPI: [name] is a 64 y.o. female who returns for follow up appointment for chronic pain and medication refill. She states her pain is located in her lower back , she asked about Medial Branch Block injections, she will be scheduled with Dr Carilyn to discuss injections, she verbalizes understanding. She rates her pain 3. Her current exercise regime is walking and performing stretching exercises.  Ms. Dales Morphine  equivalent is 40.00 MME.   Last Oral Swab was Performed on 11/15/2024, it was consistent.      Pain Inventory Average Pain 7 Pain Right Now 3 My pain is sharp, dull, tingling, and aching  In the last 24 hours, has pain interfered with the following? General activity 0 Relation with others 0 Enjoyment of life 0 What TIME of day is your pain at its worst? morning  Sleep (in general) Good  Pain is worse with: inactivity Pain improves with: heat/ice and TENS Relief from Meds: 6  Family History  Problem Relation Age of Onset   Colon cancer Father 26   Alcohol abuse Father    Colon polyps Brother    Anxiety disorder Brother    Depression Brother    Breast cancer Sister 26   Obesity Sister    Anxiety disorder Sister    Depression Sister    Anxiety disorder Mother    Depression Mother    Bipolar disorder Mother    Lymphoma Brother    Drug abuse Brother    Anxiety disorder Sister    Depression Sister    Bipolar disorder Sister    Esophageal cancer Neg Hx    Stomach cancer Neg Hx    Rectal cancer Neg Hx    Social History   Socioeconomic History   Marital status: Divorced    Spouse name: Not on file   Number of children: 1   Years of education: Not on file   Highest education level: Associate degree: occupational, scientist, product/process development, or vocational program  Occupational History   Occupation: chief financial officer    Commenthydrologist  Tobacco Use   Smoking status: Never     Passive exposure: Never   Smokeless tobacco: Never  Vaping Use   Vaping status: Never Used  Substance and Sexual Activity   Alcohol use: No   Drug use: Yes    Comment: have drinks monthly   Sexual activity: Yes  Other Topics Concern   Not on file  Social History Narrative   Married for 20+ years, divorced Dec 2017   Living on own      Social Drivers of Health   Tobacco Use: Low Risk (11/15/2024)   Patient History    Smoking Tobacco Use: Never    Smokeless Tobacco Use: Never    Passive Exposure: Never  Financial Resource Strain: Not on file  Food Insecurity: Not on file  Transportation Needs: Not on file  Physical Activity: Not on file  Stress: Not on file  Social Connections: Not on file  Depression (PHQ2-9): Low Risk (10/16/2024)   Depression (PHQ2-9)    PHQ-2 Score: 0  Alcohol Screen: Not on file  Housing: Not on file  Utilities: Not on file  Health Literacy: Not on file   Past Surgical History:  Procedure Laterality Date   ABDOMINAL HYSTERECTOMY  04/2000   partial, fibroids   BREAST LUMPECTOMY  07/17/2012   Left Breast: Invasive  ductal Cracinoma, 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   BREATH TEK H PYLORI  12/07/2011   Procedure: BREATH TEK H PYLORI;  Surgeon: Alm VEAR Angle, MD;  Location: [facility];  Service: Endoscopy;  Laterality: N/A;   COLONOSCOPY  08/25/2016   Dr.Nandigam   Needle Core Biopsy  06/30/2012   Left Breast: 12 O'clock/ Benign Parenchyma   Past Medical History:  Diagnosis Date   Anxiety    Arthritis 2010   lumbar spine   Breast cancer (HCC) 07/17/2012   at age 27, ER/PR +, hER 2 -   Cancer (HCC) 07/07/2012   Left Breast Inv Ductal Ca.   Degenerative disk disease 2010   Lumbar spine   Depression    GERD (gastroesophageal reflux disease)    hx   H/O hiatal hernia    small   Hyperlipidemia    Knee pain, right    No pertinent past medical history    Obesity    S/P radiation therapy 11/30/12 - 01/12/13   Left Breast / 50 gray in 25 Fractions with boost 10 gray in 5 Fractions   Status post chemotherapy comp 10/27/12    4 CyclesTaxotere/ Cytoxan    Status post chemotherapy Comp. 01/12/13   neoadjuvant chemotherapy: 4 Cycles of Taxotere /Cytoxan    Use of tamoxifen  (Nolvadex ) 01/18/2013   BP 115/84   Pulse 95   Ht 5' 6 (1.676 m)   Wt 170 lb (77.1 kg)   SpO2 95%   BMI 27.44 kg/m   Opioid Risk Score:   Fall Risk Score:  `1  Depression screen [facility] 2/9     10/16/2024    9:41 AM 09/19/2024   10:57 AM 08/01/2024   10:55 AM 07/05/2024   10:51 AM 05/02/2024   10:56 AM 04/11/2024    9:06 AM 03/01/2024   12:58 PM  Depression screen PHQ 2/9  Decreased Interest 0 0 0 0 0 0 0  Down, Depressed, Hopeless 0 0 0 0 0 0 0  PHQ - 2 Score 0 0 0 0 0 0 0  Altered sleeping 0     0   Tired, decreased energy 0     0   Change in appetite 0     0   Feeling bad or failure about yourself  0     0   Trouble concentrating 0     0   Moving slowly or fidgety/restless 0     0   Suicidal thoughts 0     0   PHQ-9 Score 0     0    Difficult doing work/chores  Not difficult at all      Data saved with a previous  flowsheet row definition    Review of Systems  Musculoskeletal:  Positive for back pain.  All other systems reviewed and are negative.      Objective:   Physical Exam Vitals and nursing note reviewed.  Constitutional:      Appearance: Normal appearance.  Cardiovascular:     Rate and Rhythm: Normal rate and regular rhythm.     Pulses: Normal pulses.     Heart sounds: Normal heart sounds.  Pulmonary:     Effort: Pulmonary effort is normal.     Breath sounds: Normal breath sounds.  Musculoskeletal:     Comments: Normal Muscle Bulk and Muscle Testing Reveals:  Upper Extremities: Full ROM and Muscle Strength  5/5  Lumbar Paraspinal Tenderness: L-4-L-5 Lower Extremities: Full ROM and Muscle Strength 5/5 Arises from Table with ease Narrow Based  Gait     Skin:    General: Skin is warm and dry.  Neurological:     Mental Status: She is alert and oriented to person, place, and time.  Psychiatric:        Mood and Affect: Mood normal.        Behavior: Behavior normal.          Assessment & Plan:  Lumbar Spinal Stenosis/ Right Lumbar Radiculitis: Ms. Wisnewski  asked about MBB and Ablation, she was speaking to her friend, she reports. She will be scheduled with Dr Carilyn to discuss injection, she verbalizes understanding. She will continue to follow her HEP as Tolerated.  She reports she seen Dr Clois ide will continue to monitor. 12/20/2024. Chronic Bilateral Thoracic Back Pain: No complaints today. Continue HEP . Continue current medication regimen. Continue to monitor. 12/20/2024.  Chronic Pain Syndrome: Continue  Hydrocodone  10/325 mg one tablet 4 times a day as needed for pain #120. We will continue the opioid monitoring program, this consists of regular clinic visits, examinations, urine drug screen, pill counts as well as use of [place]  Controlled Substance Reporting system. A 12 month History has been reviewed on the [place]  Controlled Substance Reporting  System on 12/20/2024.    F/U in 1 month  "

## 2024-12-21 ENCOUNTER — Encounter: Payer: Self-pay | Admitting: Physical Medicine & Rehabilitation

## 2024-12-21 ENCOUNTER — Encounter: Admitting: Physical Medicine & Rehabilitation

## 2024-12-21 VITALS — BP 122/88 | HR 84 | Ht 66.0 in | Wt 170.0 lb

## 2024-12-21 DIAGNOSIS — M47817 Spondylosis without myelopathy or radiculopathy, lumbosacral region: Secondary | ICD-10-CM

## 2024-12-21 DIAGNOSIS — M48061 Spinal stenosis, lumbar region without neurogenic claudication: Secondary | ICD-10-CM | POA: Diagnosis not present

## 2024-12-21 NOTE — Progress Notes (Signed)
 "  Subjective:    Patient ID: Joann Page, female    DOB: 1961/11/22, 64 y.o.   MRN: [id number]  HPI Discussed the use of AI scribe software for clinical note transcription with the patient, who gave verbal consent to proceed.  History of Present Illness Joann Page is a 64 year old female with chronic lumbar facet arthropathy who presents to discuss medial branch blocks for management of chronic low back pain.  She experiences chronic low back pain that is bilateral, previously more pronounced on the left but now variable between both sides. Pain intensity is typically 3 out of 10 at rest and increases to 7 out of 10 with activities such as lifting, bending, or prolonged sitting. She avoids exacerbating activities, including lifting heavy objects and grocery shopping, despite adequate upper body strength. She has begun to notice pain radiating into her hips.  She is managed with hydrocodone  four times daily and has previously completed physical therapy. Over a year ago, she was evaluated by a spine surgeon in [place], where she underwent an updated MRI and was told there was worsening arthritis at L4-5 and L5-S1. Surgical fusion was discussed but she declined operative intervention.   Pain Inventory Average Pain varies Pain Right Now 3 My pain is dull, tingling, and aching  In the last 24 hours, has pain interfered with the following? General activity 0 Relation with others 0 Enjoyment of life 0 What TIME of day is your pain at its worst? morning  and night Sleep (in general) Good  Pain is worse with: bending and standing Pain improves with: heat/ice and medication Relief from Meds: 9  Family History  Problem Relation Age of Onset   Colon cancer Father 15   Alcohol abuse Father    Colon polyps Brother    Anxiety disorder Brother    Depression Brother    Breast cancer Sister 34   Obesity Sister    Anxiety disorder Sister    Depression Sister    Anxiety disorder Mother     Depression Mother    Bipolar disorder Mother    Lymphoma Brother    Drug abuse Brother    Anxiety disorder Sister    Depression Sister    Bipolar disorder Sister    Esophageal cancer Neg Hx    Stomach cancer Neg Hx    Rectal cancer Neg Hx    Social History   Socioeconomic History   Marital status: Divorced    Spouse name: Not on file   Number of children: 1   Years of education: Not on file   Highest education level: Associate degree: occupational, scientist, product/process development, or vocational program  Occupational History   Occupation: chief financial officer    Commenthydrologist  Tobacco Use   Smoking status: Never    Passive exposure: Never   Smokeless tobacco: Never  Vaping Use   Vaping status: Never Used  Substance and Sexual Activity   Alcohol use: No   Drug use: Yes    Comment: have drinks monthly   Sexual activity: Yes  Other Topics Concern   Not on file  Social History Narrative   Married for 20+ years, divorced Dec 2017   Living on own      Social Drivers of Health   Tobacco Use: Low Risk (12/20/2024)   Patient History    Smoking Tobacco Use: Never    Smokeless Tobacco Use: Never    Passive Exposure: Never  Financial Resource Strain: Not on file  Food  Insecurity: Not on file  Transportation Needs: Not on file  Physical Activity: Not on file  Stress: Not on file  Social Connections: Not on file  Depression (PHQ2-9): Low Risk (10/16/2024)   Depression (PHQ2-9)    PHQ-2 Score: 0  Alcohol Screen: Not on file  Housing: Not on file  Utilities: Not on file  Health Literacy: Not on file   Past Surgical History:  Procedure Laterality Date   ABDOMINAL HYSTERECTOMY  04/2000   partial, fibroids   BREAST LUMPECTOMY  07/17/2012   Left Breast: Invasive ductal Cracinoma, 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   BREATH TEK H PYLORI  12/07/2011   Procedure: BREATH TEK H PYLORI;  Surgeon: Alm VEAR Angle, MD;  Location: [facility];  Service: Endoscopy;  Laterality: N/A;   COLONOSCOPY  08/25/2016   Dr.Nandigam   Needle Core Biopsy  06/30/2012   Left Breast: 12 O'clock/ Benign Parenchyma   Past Medical History:  Diagnosis Date   Anxiety    Arthritis 2010   lumbar spine   Breast cancer (HCC) 07/17/2012   at age 69, ER/PR +, hER 2 -   Cancer (HCC) 07/07/2012   Left Breast Inv Ductal Ca.   Degenerative disk disease 2010   Lumbar spine   Depression    GERD (gastroesophageal reflux disease)    hx   H/O hiatal hernia    small   Hyperlipidemia    Knee pain, right    No pertinent past medical history    Obesity    S/P radiation therapy 11/30/12 - 01/12/13   Left Breast / 50 gray in 25 Fractions with boost 10 gray in 5 Fractions   Status post chemotherapy comp 10/27/12    4 CyclesTaxotere/ Cytoxan    Status post chemotherapy Comp. 01/12/13   neoadjuvant chemotherapy: 4 Cycles of Taxotere /Cytoxan    Use of tamoxifen  (Nolvadex ) 01/18/2013   BP 122/88   Pulse 84   Ht 5' 6 (1.676 m)   Wt 170 lb (77.1 kg)   SpO2 97%   BMI 27.44 kg/m   Opioid Risk Score:   Fall Risk Score:  `1  Depression screen [facility] 2/9     10/16/2024    9:41 AM 09/19/2024    10:57 AM 08/01/2024   10:55 AM 07/05/2024   10:51 AM 05/02/2024   10:56 AM 04/11/2024    9:06 AM 03/01/2024   12:58 PM  Depression screen PHQ 2/9  Decreased Interest 0 0 0 0 0 0 0  Down, Depressed, Hopeless 0 0 0 0 0 0 0  PHQ - 2 Score 0 0 0 0 0 0 0  Altered sleeping  0     0   Tired, decreased energy 0     0   Change in appetite 0     0   Feeling bad or failure about yourself  0     0   Trouble concentrating 0     0   Moving slowly or fidgety/restless 0     0   Suicidal thoughts 0     0   PHQ-9 Score 0     0    Difficult doing work/chores      Not difficult at all      Data saved with a previous flowsheet row definition      Review of Systems     Objective:   Physical Exam Vitals and nursing note reviewed.  Constitutional:      Appearance: She is normal weight.  HENT:     Head: Normocephalic and atraumatic.  Eyes:     Extraocular Movements: Extraocular movements intact.     Pupils: Pupils are equal, round, and reactive to light.  Musculoskeletal:     Right lower leg: No edema.     Left lower leg: No edema.     Comments: There is tenderness palpation bilateral paraspinals at L4-L5 area.  There is pain with extension of the lumbar spine with limitation range of motion Flexion lumbar spine is normal without pain. There is no pain with hip or knee range of motion there is good flexibility in the hips  Sacral thrust (prone) : Negative Lateral compression: Negative FABER's: Negative Distraction (supine): Negative Thigh thrust test: Negative  Skin:    General: Skin is warm and dry.  Neurological:     Mental Status: She is alert and oriented to person, place, and time.  Psychiatric:        Mood and Affect: Mood normal.        Behavior: Behavior normal.           Assessment & Plan:   Assessment and Plan Assessment & Plan Lumbar facet arthropathy with chronic low back pain Chronic lumbar facet arthropathy at L4-5 and L5-S1 with bilateral low back pain, worse on the  left, consistent with facet-mediated pain. Managed with hydrocodone . Declined surgical fusion. - Recommended medial branch blocks with fluoroscopic guidance, local anesthetic, and contrast for needle placement confirmation. - Explained medial branch block as a test block for relief lasting a few days to a week; a second block if =80% pain relief from the first. - Discussed radiofrequency ablation for 6-12 months relief if both test blocks successful; repeat if >=50% relief for 6 months. - Clarified nerve regrowth not expected to increase pain. - Reviewed procedure as low risk, no medication changes, no driver needed. - Arranged nursing review of pre-procedure instructions. - Advised scheduling medial branch block and maintaining follow-up if not proceeding.   "

## 2024-12-21 NOTE — Patient Instructions (Addendum)
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 We reviewed the option of medial branch blocks as a potential treatment.  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 This pain is managed with hydrocodone , and you have previously declined surgical fusion. -We recommend medial branch blocks with fluoroscopic guidance, local anesthetic, and contrast to confirm needle placement. -The medial branch block is a test block that may provide relief for a few days to a week. If you experience at least 80% pain relief from the first block, we will proceed with a second block. -If both test blocks are successful, we can consider radiofrequency ablation for longer-term relief lasting 6-12 months. This procedure can be repeated if you experience at least 50% pain relief for 6 months. -Nerve regrowth is not expected to increase your pain. -The procedure is low risk, and you do not need to change your medications or arrange for a driver. -A nurse will review the pre-procedure instructions with you. -Please schedule the medial branch block and maintain follow-up appointments if you decide to proceed.    Contains text generated by [employer].        Contains text generated by [employer].   "

## 2025-01-17 ENCOUNTER — Encounter: Admitting: Registered Nurse

## 2025-01-24 ENCOUNTER — Encounter: Payer: 59 | Admitting: Adult Health

## 2025-01-29 ENCOUNTER — Encounter: Attending: Physical Medicine & Rehabilitation | Admitting: Physical Medicine & Rehabilitation

## 2025-05-20 ENCOUNTER — Ambulatory Visit: Admitting: Dermatology
